# Patient Record
Sex: Female | Born: 1965 | Race: Black or African American | Hispanic: No | Marital: Married | Smoking: Never smoker
Health system: Southern US, Community
[De-identification: ages and names within clinical notes are randomized; demographics above are authoritative.]

## PROBLEM LIST (undated history)

## (undated) DIAGNOSIS — A630 Anogenital (venereal) warts: Secondary | ICD-10-CM

## (undated) DIAGNOSIS — S83419A Sprain of medial collateral ligament of unspecified knee, initial encounter: Secondary | ICD-10-CM

## (undated) DIAGNOSIS — Z8619 Personal history of other infectious and parasitic diseases: Secondary | ICD-10-CM

## (undated) DIAGNOSIS — K449 Diaphragmatic hernia without obstruction or gangrene: Secondary | ICD-10-CM

## (undated) DIAGNOSIS — R200 Anesthesia of skin: Secondary | ICD-10-CM

## (undated) DIAGNOSIS — K219 Gastro-esophageal reflux disease without esophagitis: Secondary | ICD-10-CM

## (undated) HISTORY — DX: Diaphragmatic hernia without obstruction or gangrene: K44.9

## (undated) HISTORY — DX: Sprain of medial collateral ligament of unspecified knee, initial encounter: S83.419A

## (undated) HISTORY — DX: Anogenital (venereal) warts: A63.0

## (undated) HISTORY — DX: Personal history of other infectious and parasitic diseases: Z86.19

## (undated) HISTORY — DX: Gastro-esophageal reflux disease without esophagitis: K21.9

## (undated) HISTORY — DX: Anesthesia of skin: R20.0

---

## 1999-06-07 ENCOUNTER — Other Ambulatory Visit: Admission: RE | Admit: 1999-06-07 | Discharge: 1999-06-07 | Payer: Self-pay | Admitting: Obstetrics and Gynecology

## 2002-06-07 ENCOUNTER — Other Ambulatory Visit: Admission: RE | Admit: 2002-06-07 | Discharge: 2002-06-07 | Payer: Self-pay | Admitting: *Deleted

## 2003-02-22 ENCOUNTER — Encounter: Payer: Self-pay | Admitting: Family Medicine

## 2003-02-22 ENCOUNTER — Encounter: Admission: RE | Admit: 2003-02-22 | Discharge: 2003-02-22 | Payer: Self-pay | Admitting: Family Medicine

## 2003-07-06 ENCOUNTER — Other Ambulatory Visit: Admission: RE | Admit: 2003-07-06 | Discharge: 2003-07-06 | Payer: Self-pay | Admitting: *Deleted

## 2003-08-07 ENCOUNTER — Emergency Department (HOSPITAL_COMMUNITY): Admission: EM | Admit: 2003-08-07 | Discharge: 2003-08-07 | Payer: Self-pay | Admitting: *Deleted

## 2004-07-04 ENCOUNTER — Other Ambulatory Visit: Admission: RE | Admit: 2004-07-04 | Discharge: 2004-07-04 | Payer: Self-pay | Admitting: Family Medicine

## 2004-09-26 ENCOUNTER — Encounter: Payer: Self-pay | Admitting: Gastroenterology

## 2004-10-25 ENCOUNTER — Ambulatory Visit: Payer: Self-pay | Admitting: Gastroenterology

## 2004-11-14 ENCOUNTER — Ambulatory Visit: Payer: Self-pay | Admitting: Family Medicine

## 2004-11-21 ENCOUNTER — Encounter: Admission: RE | Admit: 2004-11-21 | Discharge: 2004-11-21 | Payer: Self-pay | Admitting: Family Medicine

## 2004-11-30 ENCOUNTER — Ambulatory Visit: Payer: Self-pay | Admitting: Family Medicine

## 2005-01-23 DIAGNOSIS — R200 Anesthesia of skin: Secondary | ICD-10-CM

## 2005-01-23 HISTORY — DX: Anesthesia of skin: R20.0

## 2005-06-17 ENCOUNTER — Ambulatory Visit: Payer: Self-pay | Admitting: Family Medicine

## 2005-08-27 ENCOUNTER — Other Ambulatory Visit: Admission: RE | Admit: 2005-08-27 | Discharge: 2005-08-27 | Payer: Self-pay | Admitting: Family Medicine

## 2005-08-27 ENCOUNTER — Ambulatory Visit: Payer: Self-pay | Admitting: Family Medicine

## 2005-12-23 DIAGNOSIS — S83419A Sprain of medial collateral ligament of unspecified knee, initial encounter: Secondary | ICD-10-CM

## 2005-12-23 HISTORY — DX: Sprain of medial collateral ligament of unspecified knee, initial encounter: S83.419A

## 2006-01-02 ENCOUNTER — Ambulatory Visit: Payer: Self-pay | Admitting: Family Medicine

## 2006-03-27 ENCOUNTER — Encounter: Admission: RE | Admit: 2006-03-27 | Discharge: 2006-03-27 | Payer: Self-pay | Admitting: Family Medicine

## 2006-05-04 ENCOUNTER — Emergency Department (HOSPITAL_COMMUNITY): Admission: EM | Admit: 2006-05-04 | Discharge: 2006-05-04 | Payer: Self-pay | Admitting: Emergency Medicine

## 2006-07-21 ENCOUNTER — Ambulatory Visit: Payer: Self-pay | Admitting: Family Medicine

## 2006-09-10 ENCOUNTER — Ambulatory Visit: Payer: Self-pay | Admitting: Family Medicine

## 2006-10-01 ENCOUNTER — Other Ambulatory Visit: Admission: RE | Admit: 2006-10-01 | Discharge: 2006-10-01 | Payer: Self-pay | Admitting: Family Medicine

## 2006-10-01 ENCOUNTER — Encounter: Payer: Self-pay | Admitting: Family Medicine

## 2006-10-01 ENCOUNTER — Ambulatory Visit: Payer: Self-pay | Admitting: Family Medicine

## 2006-10-01 LAB — CONVERTED CEMR LAB: Pap Smear: NORMAL

## 2006-11-24 ENCOUNTER — Ambulatory Visit: Payer: Self-pay | Admitting: Family Medicine

## 2007-01-22 ENCOUNTER — Ambulatory Visit: Payer: Self-pay | Admitting: Family Medicine

## 2007-03-20 ENCOUNTER — Encounter: Payer: Self-pay | Admitting: Family Medicine

## 2007-03-20 DIAGNOSIS — N6019 Diffuse cystic mastopathy of unspecified breast: Secondary | ICD-10-CM | POA: Insufficient documentation

## 2007-03-20 DIAGNOSIS — J309 Allergic rhinitis, unspecified: Secondary | ICD-10-CM | POA: Insufficient documentation

## 2007-03-20 DIAGNOSIS — B977 Papillomavirus as the cause of diseases classified elsewhere: Secondary | ICD-10-CM | POA: Insufficient documentation

## 2007-03-20 DIAGNOSIS — K219 Gastro-esophageal reflux disease without esophagitis: Secondary | ICD-10-CM | POA: Insufficient documentation

## 2007-03-20 DIAGNOSIS — L509 Urticaria, unspecified: Secondary | ICD-10-CM | POA: Insufficient documentation

## 2007-04-21 ENCOUNTER — Telehealth (INDEPENDENT_AMBULATORY_CARE_PROVIDER_SITE_OTHER): Payer: Self-pay | Admitting: *Deleted

## 2007-06-24 ENCOUNTER — Ambulatory Visit: Payer: Self-pay | Admitting: Family Medicine

## 2007-07-24 ENCOUNTER — Other Ambulatory Visit: Admission: RE | Admit: 2007-07-24 | Discharge: 2007-07-24 | Payer: Self-pay | Admitting: Family Medicine

## 2007-07-24 ENCOUNTER — Encounter: Payer: Self-pay | Admitting: Family Medicine

## 2007-07-24 ENCOUNTER — Ambulatory Visit: Payer: Self-pay | Admitting: Family Medicine

## 2007-07-28 ENCOUNTER — Encounter: Admission: RE | Admit: 2007-07-28 | Discharge: 2007-07-28 | Payer: Self-pay | Admitting: Family Medicine

## 2007-07-30 ENCOUNTER — Encounter (INDEPENDENT_AMBULATORY_CARE_PROVIDER_SITE_OTHER): Payer: Self-pay | Admitting: *Deleted

## 2007-07-30 LAB — CONVERTED CEMR LAB: Pap Smear: NORMAL

## 2007-09-01 ENCOUNTER — Telehealth: Payer: Self-pay | Admitting: Family Medicine

## 2007-09-02 ENCOUNTER — Ambulatory Visit: Payer: Self-pay | Admitting: Obstetrics & Gynecology

## 2007-10-12 ENCOUNTER — Telehealth: Payer: Self-pay | Admitting: Family Medicine

## 2007-11-02 ENCOUNTER — Encounter: Admission: RE | Admit: 2007-11-02 | Discharge: 2007-11-02 | Payer: Self-pay | Admitting: Family Medicine

## 2007-11-04 ENCOUNTER — Encounter (INDEPENDENT_AMBULATORY_CARE_PROVIDER_SITE_OTHER): Payer: Self-pay | Admitting: *Deleted

## 2007-12-03 ENCOUNTER — Telehealth: Payer: Self-pay | Admitting: Family Medicine

## 2008-01-05 ENCOUNTER — Telehealth: Payer: Self-pay | Admitting: Family Medicine

## 2008-01-29 ENCOUNTER — Ambulatory Visit: Payer: Self-pay | Admitting: Family Medicine

## 2008-01-29 LAB — CONVERTED CEMR LAB
Bacteria, UA: 0
Bilirubin Urine: NEGATIVE
Casts: 0 /lpf
Glucose, Urine, Semiquant: NEGATIVE
Ketones, urine, test strip: NEGATIVE
Nitrite: NEGATIVE
Specific Gravity, Urine: 1.015
Urine crystals, microscopic: 0 /hpf
Urobilinogen, UA: 0.2
WBC, UA: 0 cells/hpf
Yeast, UA: 0
pH: 6.5

## 2008-03-18 ENCOUNTER — Ambulatory Visit: Payer: Self-pay | Admitting: Family Medicine

## 2008-03-22 ENCOUNTER — Encounter: Admission: RE | Admit: 2008-03-22 | Discharge: 2008-03-22 | Payer: Self-pay | Admitting: Family Medicine

## 2008-07-08 ENCOUNTER — Ambulatory Visit: Payer: Self-pay | Admitting: Family Medicine

## 2008-07-08 LAB — CONVERTED CEMR LAB
KOH Prep: NEGATIVE
Whiff Test: NEGATIVE

## 2008-10-13 ENCOUNTER — Ambulatory Visit: Payer: Self-pay | Admitting: Family Medicine

## 2008-10-13 ENCOUNTER — Encounter: Payer: Self-pay | Admitting: Family Medicine

## 2008-10-13 ENCOUNTER — Other Ambulatory Visit: Admission: RE | Admit: 2008-10-13 | Discharge: 2008-10-13 | Payer: Self-pay | Admitting: Family Medicine

## 2008-10-17 LAB — CONVERTED CEMR LAB
ALT: 18 units/L (ref 0–35)
AST: 20 units/L (ref 0–37)
Albumin: 3.8 g/dL (ref 3.5–5.2)
Alkaline Phosphatase: 54 units/L (ref 39–117)
BUN: 10 mg/dL (ref 6–23)
Basophils Absolute: 0.1 10*3/uL (ref 0.0–0.1)
Basophils Relative: 1.4 % (ref 0.0–3.0)
Bilirubin, Direct: 0.1 mg/dL (ref 0.0–0.3)
CO2: 29 meq/L (ref 19–32)
Calcium: 8.9 mg/dL (ref 8.4–10.5)
Chloride: 105 meq/L (ref 96–112)
Cholesterol: 218 mg/dL (ref 0–200)
Creatinine, Ser: 0.8 mg/dL (ref 0.4–1.2)
Direct LDL: 123.8 mg/dL
Eosinophils Absolute: 0.2 10*3/uL (ref 0.0–0.7)
Eosinophils Relative: 3.4 % (ref 0.0–5.0)
GFR calc Af Amer: 102 mL/min
GFR calc non Af Amer: 84 mL/min
Glucose, Bld: 87 mg/dL (ref 70–99)
HCT: 39.6 % (ref 36.0–46.0)
HDL: 60.8 mg/dL (ref >39.0)
Hemoglobin: 13.6 g/dL (ref 12.0–15.0)
Lymphocytes Relative: 28.8 % (ref 12.0–46.0)
MCHC: 34.3 g/dL (ref 30.0–36.0)
MCV: 87 fL (ref 78.0–100.0)
Monocytes Absolute: 0.4 10*3/uL (ref 0.1–1.0)
Monocytes Relative: 6.1 % (ref 3.0–12.0)
Neutro Abs: 3.4 10*3/uL (ref 1.4–7.7)
Neutrophils Relative %: 60.3 % (ref 43.0–77.0)
Platelets: 407 10*3/uL — ABNORMAL HIGH (ref 150–400)
Potassium: 3.5 meq/L (ref 3.5–5.1)
RBC: 4.55 M/uL (ref 3.87–5.11)
RDW: 12.6 % (ref 11.5–14.6)
Sodium: 140 meq/L (ref 135–145)
TSH: 1 microintl units/mL (ref 0.35–5.50)
Total Bilirubin: 0.9 mg/dL (ref 0.3–1.2)
Total CHOL/HDL Ratio: 3.6
Total Protein: 7.4 g/dL (ref 6.0–8.3)
Triglycerides: 61 mg/dL (ref 0–149)
VLDL: 12 mg/dL (ref 0–40)
WBC: 5.8 10*3/uL (ref 4.5–10.5)

## 2008-11-02 ENCOUNTER — Encounter: Admission: RE | Admit: 2008-11-02 | Discharge: 2008-11-02 | Payer: Self-pay | Admitting: Family Medicine

## 2008-11-08 ENCOUNTER — Ambulatory Visit: Payer: Self-pay | Admitting: Gastroenterology

## 2008-11-22 ENCOUNTER — Encounter: Payer: Self-pay | Admitting: Family Medicine

## 2008-12-14 ENCOUNTER — Ambulatory Visit: Payer: Self-pay | Admitting: Family Medicine

## 2009-02-10 ENCOUNTER — Telehealth (INDEPENDENT_AMBULATORY_CARE_PROVIDER_SITE_OTHER): Payer: Self-pay | Admitting: Internal Medicine

## 2009-09-06 ENCOUNTER — Ambulatory Visit: Payer: Self-pay | Admitting: Family Medicine

## 2009-12-11 ENCOUNTER — Encounter: Admission: RE | Admit: 2009-12-11 | Discharge: 2009-12-11 | Payer: Self-pay | Admitting: Family Medicine

## 2009-12-14 ENCOUNTER — Encounter (INDEPENDENT_AMBULATORY_CARE_PROVIDER_SITE_OTHER): Payer: Self-pay | Admitting: *Deleted

## 2010-01-15 ENCOUNTER — Ambulatory Visit: Payer: Self-pay | Admitting: Family Medicine

## 2010-01-15 ENCOUNTER — Other Ambulatory Visit: Admission: RE | Admit: 2010-01-15 | Discharge: 2010-01-15 | Payer: Self-pay | Admitting: Family Medicine

## 2010-01-18 LAB — CONVERTED CEMR LAB
ALT: 12 units/L (ref 0–35)
AST: 18 units/L (ref 0–37)
Albumin: 4 g/dL (ref 3.5–5.2)
Alkaline Phosphatase: 45 units/L (ref 39–117)
BUN: 6 mg/dL (ref 6–23)
Basophils Absolute: 0 10*3/uL (ref 0.0–0.1)
Basophils Relative: 0.9 % (ref 0.0–3.0)
Bilirubin, Direct: 0.1 mg/dL (ref 0.0–0.3)
CO2: 25 meq/L (ref 19–32)
Calcium: 9.1 mg/dL (ref 8.4–10.5)
Chloride: 107 meq/L (ref 96–112)
Cholesterol: 168 mg/dL (ref 0–200)
Creatinine, Ser: 0.6 mg/dL (ref 0.4–1.2)
Eosinophils Absolute: 0.2 10*3/uL (ref 0.0–0.7)
Eosinophils Relative: 3.5 % (ref 0.0–5.0)
GFR calc non Af Amer: 140.74 mL/min (ref >60)
Glucose, Bld: 62 mg/dL — ABNORMAL LOW (ref 70–99)
HCT: 40.1 % (ref 36.0–46.0)
HDL: 57.4 mg/dL (ref >39.00)
Hemoglobin: 13.4 g/dL (ref 12.0–15.0)
LDL Cholesterol: 101 mg/dL — ABNORMAL HIGH (ref 0–99)
Lymphocytes Relative: 36.2 % (ref 12.0–46.0)
Lymphs Abs: 2 10*3/uL (ref 0.7–4.0)
MCHC: 33.5 g/dL (ref 30.0–36.0)
MCV: 88.3 fL (ref 78.0–100.0)
Monocytes Absolute: 0.3 10*3/uL (ref 0.1–1.0)
Monocytes Relative: 6.3 % (ref 3.0–12.0)
Neutro Abs: 3 10*3/uL (ref 1.4–7.7)
Neutrophils Relative %: 53.1 % (ref 43.0–77.0)
Platelets: 356 10*3/uL (ref 150.0–400.0)
Potassium: 3.9 meq/L (ref 3.5–5.1)
RBC: 4.54 M/uL (ref 3.87–5.11)
RDW: 12.4 % (ref 11.5–14.6)
Sodium: 137 meq/L (ref 135–145)
TSH: 1.27 microintl units/mL (ref 0.35–5.50)
Total Bilirubin: 0.8 mg/dL (ref 0.3–1.2)
Total CHOL/HDL Ratio: 3
Total Protein: 7.6 g/dL (ref 6.0–8.3)
Triglycerides: 48 mg/dL (ref 0.0–149.0)
VLDL: 9.6 mg/dL (ref 0.0–40.0)
WBC: 5.5 10*3/uL (ref 4.5–10.5)

## 2010-01-23 ENCOUNTER — Encounter (INDEPENDENT_AMBULATORY_CARE_PROVIDER_SITE_OTHER): Payer: Self-pay | Admitting: *Deleted

## 2010-01-23 LAB — CONVERTED CEMR LAB: Pap Smear: NEGATIVE

## 2010-05-28 ENCOUNTER — Telehealth: Payer: Self-pay | Admitting: Family Medicine

## 2010-09-28 ENCOUNTER — Ambulatory Visit: Payer: Self-pay | Admitting: Family Medicine

## 2010-10-03 ENCOUNTER — Encounter: Admission: RE | Admit: 2010-10-03 | Discharge: 2010-10-03 | Payer: Self-pay | Admitting: Family Medicine

## 2010-10-04 LAB — CONVERTED CEMR LAB
Prolactin: 8.4 ng/mL
TSH: 0.97 u[IU]/mL (ref 0.35–5.50)

## 2010-10-09 ENCOUNTER — Encounter (INDEPENDENT_AMBULATORY_CARE_PROVIDER_SITE_OTHER): Payer: Self-pay | Admitting: *Deleted

## 2010-10-09 ENCOUNTER — Encounter: Payer: Self-pay | Admitting: Family Medicine

## 2010-11-06 ENCOUNTER — Ambulatory Visit: Payer: Self-pay | Admitting: Family Medicine

## 2011-01-12 ENCOUNTER — Encounter: Payer: Self-pay | Admitting: Family Medicine

## 2011-01-14 ENCOUNTER — Encounter: Payer: Self-pay | Admitting: Family Medicine

## 2011-01-24 NOTE — Miscellaneous (Signed)
Summary: mammogram added to flow sheet   Clinical Lists Changes  Observations: Added new observation of MAMMOGRAM: normal (10/03/2010 11:41)      Preventive Care Screening  Mammogram:    Date:  10/03/2010    Results:  normal Left message for patient to call back. Lewanda Rife LPN  October 09, 2010 11:41 AM

## 2011-01-24 NOTE — Letter (Signed)
Summary: Generic Letter  [place] [facility]  [address] [place], [place] [address]   Phone: [phone]  Fax: [phone]    01/23/2010    Anna [name] [address] [place], [place]  [address]     Dear [name],   Your pap smear was normal, please repeat screening in one year    Sincerely,   Liane Comber CMA (AAMA)

## 2011-01-24 NOTE — Letter (Signed)
Summary: Results Follow up Letter   at [facility]  [address] [place], [place] [address]   Phone: [phone]  Fax: [phone]    10/09/2010 MRN: [id number]   Anna [name] [address] [place], [place]  [address]    Dear [name],  The following are the results of your recent test(s):  Test         Result    Pap Smear:        Normal _____  Not Normal _____ Comments: ______________________________________________________ Cholesterol: LDL(Bad cholesterol):         Your goal is less than:         HDL (Good cholesterol):       Your goal is more than: Comments:  ______________________________________________________ Mammogram:        Normal __x___  Not Normal _____ Comments: No findings to explain Nipple discharge.        Repeat Mammogram in one year.   ___________________________________________________________________ Hemoccult:        Normal _____  Not normal _______ Comments:    _____________________________________________________________________ Other Tests:    We routinely do not discuss normal results over the telephone.  If you desire a copy of the results, or you have any questions about this information we can discuss them at your next office visit.   Sincerely,  Idamae Schuller Tower,MD

## 2011-01-24 NOTE — Assessment & Plan Note (Signed)
Summary: cpx/pap/mk   Vital Signs:  Patient profile:   45 year old female Height:      63.5 inches Weight:      126 pounds BMI:     22.05 Temp:     97.6 degrees F oral Pulse rate:   68 / minute Pulse rhythm:   regular BP sitting:   90 / 58  (left arm) Cuff size:   regular  Vitals Entered By: Lowella Petties CMA (January 15, 2010 2:48 PM) CC: 30 minute check up   History of Present Illness: here for health mt exam  is feeling fine overall -- is healthy  has a few concerns   has pains intermittently in buttocks -- hurts to straighten up and stand up after sitting for a long time  has been like this for 4 months -- 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   is exercising treadmill - but no wt lifting or stretching   she bit her lip - top of it -- then ulcers/ blisters there that hurt / has not had cold sore for a long time    wt is down 3 lb with bmi of 22 is on a diet eating fruit and vegetable s and peanuts and rice , and lots of water for 3 weeks -- is on a fast (religous and health)    pap nl 10/09  no trouble withmenses -- pretty regular and not  too heavy or painful  no need to be on birth control    mam 12/10- normal recently self exam - no lumps or changes   Td 06 up to date  does nto get flu shots   last labs good 2009 with LDL 123  wants to do labs today   Allergies: No Known Drug Allergies  Past History:  Past Medical History: Last updated: 07/08/2008 Allergic rhinitis GERD/ Hiatal hernia small uterine fibroids ? anxiety  Past Surgical History: Last updated: 03/20/2007 Abd [place] - negative (07/2004) EGD- gastritis, esophagitis (09/2004) MRI/ MRA- sinusitis, white matter changes (10/2004) Facial numbness- neuro. work-up (01/2005) MCL sprain (2007)  Family History: Last updated: 11/08/2008 father, cerebral aneurysm M aunt breast ca aunt with ? female ca No FH of Colon Cancer:  Social History: Last updated: 10/13/2008 non  smoker rare alcohol exercises regularly  Risk Factors: Smoking Status: never (03/20/2007)  Review of Systems General:  Denies fatigue, fever, loss of appetite, and malaise. Eyes:  Denies blurring and eye irritation. CV:  Denies chest pain or discomfort, lightheadness, palpitations, and shortness of breath with exertion. Resp:  Denies cough and wheezing. GI:  Denies abdominal pain, change in bowel habits, indigestion, and loss of appetite. GU:  Denies abnormal vaginal bleeding, discharge, dysuria, and urinary frequency. MS:  Complains of low back pain; denies joint pain, mid back pain, and cramps. Derm:  Denies itching, lesion(s), and rash. Neuro:  Denies numbness and tingling. Psych:  Denies anxiety and depression. Endo:  Denies cold intolerance and excessive hunger. Heme:  Denies abnormal bruising and bleeding.  Physical Exam  General:  Well-developed,well-nourished,in no acute distress; alert,appropriate and cooperative throughout examination Head:  normocephalic, atraumatic, and no abnormalities observed.   Eyes:  vision grossly intact, pupils equal, pupils round, and pupils reactive to light.  no conjunctival pallor, injection or icterus  Ears:  R ear normal and L ear normal.   Nose:  no nasal discharge.   Mouth:  2 small apthous ulcers under L side of upper lip Neck:  supple  with full rom and no masses or thyromegally, no JVD or carotid bruit  Chest Wall:  No deformities, masses, or tenderness noted. Breasts:  No mass, nodules, thickening, tenderness, bulging, retraction, inflamation, nipple discharge or skin changes noted.   Lungs:  Normal respiratory effort, chest expands symmetrically. Lungs are clear to auscultation, no crackles or wheezes. Heart:  Normal rate and regular rhythm. S1 and S2 normal without gallop, murmur, click, rub or other extra sounds. Abdomen:  Bowel sounds positive,abdomen soft and non-tender without masses, organomegaly or hernias noted. no renal bruits   Genitalia:  Normal introitus for age, no external lesions, no vaginal discharge, mucosa pink and moist, no vaginal or cervical lesions, no vaginal atrophy, no friaility or hemorrhage, normal uterus size and position, no adnexal masses or tenderness Msk:  spine- loss of lordosis mild coccyx tenderness nl rom and neg slr Pulses:  R and L carotid,radial,femoral,dorsalis pedis and posterior tibial pulses are full and equal bilaterally Extremities:  No clubbing, cyanosis, edema, or deformity noted with normal full range of motion of all joints.   Neurologic:  sensation intact to light touch, gait normal, and DTRs symmetrical and normal.   Skin:  Intact without suspicious lesions or rashes Cervical Nodes:  No lymphadenopathy noted Axillary Nodes:  No palpable lymphadenopathy Inguinal Nodes:  No significant adenopathy Psych:  normal affect, talkative and pleasant    Impression & Recommendations:  Problem # 1:  HEALTH MAINTENANCE EXAM (ICD-V70.0) Assessment Comment Only reviewed health habits including diet, exercise and skin cancer prevention reviewed health maintenance list and family history  lab today  urged to get more protien in diet  urged to keep up the good exercise  Orders: Venipuncture (85462) TLB-Lipid Panel (80061-LIPID) TLB-BMP (Basic Metabolic Panel-BMET) (80048-METABOL) TLB-CBC Platelet - w/Differential (85025-CBCD) TLB-Hepatic/Liver Function Pnl (80076-HEPATIC) TLB-TSH (Thyroid Stimulating Hormone) (84443-TSH)  Problem # 2:  GYNECOLOGICAL EXAMINATION, ROUTINE (ICD-V72.31) Assessment: Comment Only annual exam with pap done   Problem # 3:  HYPERLIPIDEMIA (ICD-272.4) Assessment: Unchanged  check labs- expect good results with recent change in diet  rev sat fats to avoid  Orders: TLB-Lipid Panel (80061-LIPID)  Labs Reviewed: SGOT: 20 (10/13/2008)   SGPT: 18 (10/13/2008)   HDL:60.8 (10/13/2008)  LDL:DEL (10/13/2008)  Chol:218 (10/13/2008)  Trig:61  (10/13/2008)  Problem # 4:  BACK PAIN (ICD-724.5) Assessment: New low back pain- may be related to prolonged sitting  recommend stretches/heat / better sitting positon / frequent breaks may be interested in trying yoga as well handout aafp on back pain adv to update me if worse or not imp  Problem # 5:  ORAL ULCER (ICD-528.9) Assessment: New under L upper lip -- likely due to trauma  will tx with kenalog in oragel base- and update if not imp   Complete Medication List: 1)  Flonase 50 Mcg/act Susp (Fluticasone propionate) .... 2 sprays in each nostril once daily through allergy season 2)  Kenalog in Orabase Gel  .... Apply to mouth ulcer with a q tip three times a day until healed  Patient Instructions: 1)  switch your nuts to walnuts and almonds - they have healthier type of fats  2)  protien sources include dairy, nuts , beans , soy, lean meats , fish 3)  make sure to get protien with every meal  4)  keep up the good exercise- add some stretches or yoga for your back  5)  let me know if back pain does not improve - use heat when you can  6)  labs  today   7)  use kenalog in orabase- on mouth ulceruntil healed  Prescriptions: KENALOG IN ORABASE GEL apply to mouth ulcer with a Q tip three times a day until healed  #1 small x 0   Entered and Authorized by:   Judith Part MD   Signed by:   Judith Part MD on 01/15/2010   Method used:   Print then Give to Patient   RxID:   [id number]   Prior Medications (reviewed today): FLONASE 50 MCG/ACT SUSP (FLUTICASONE PROPIONATE) 2 sprays in each nostril once daily through allergy season KENALOG IN ORABASE GEL () apply to mouth ulcer with a Q tip three times a day until healed Current Allergies: No known allergies

## 2011-01-24 NOTE — Progress Notes (Signed)
Summary: call a nurse  Phone Note From Other Clinic   Caller: call a nurse Summary of Call: [facility] Triage Call Report Triage Record Num: [id number] Operator: Albertine Grates Patient Name: Tallulah Hosman Call Date & Time: 05/25/2010 5:38:30PM Patient Phone: [phone] PCP: Audrie Gallus. Kaleia Longhi Patient Gender: Female PCP Fax : Patient DOB: [birth year] Practice Name: [place] Reason for Call: Has nausea/vomiting since 6-3. Has vomitted x 5. Has had 3 diarrhea stools. Has voided. Afebrile. Has been able to keep down small amount of fluids. Phenergan 25mg  op q4hrs prn #6 called to [phone]. Protocol(s) Used: Nausea / Vomiting Recommended Outcome per Protocol: Provide Home/Self Care Reason for Outcome: New onset of 3-4 episodes vomiting or diarrhea following mild abdominal cramping Care Advice:  ~ Call provider if symptoms do not improve after 24 hours of home care. 05/25/2010 5:55:03PM Page 1 of 1 [email or web address] Initial call taken by: Lowella Petties CMA,  May 28, 2010 10:11 AM

## 2011-01-24 NOTE — Assessment & Plan Note (Signed)
Summary: having discharge in nipples/alc   Vital Signs:  Patient profile:   45 year old female Height:      63.5 inches Weight:      132.25 pounds BMI:     23.14 Temp:     98 degrees F oral Pulse rate:   72 / minute Pulse rhythm:   regular BP sitting:   110 / 70  (left arm) Cuff size:   regular  Vitals Entered By: Lewanda Rife LPN (September 28, 2010 10:29 AM) CC: dried milk appears around nipple on and off   History of Present Illness: here for nipple discharge has been having L breast white discharge - looks like dried milk, then on both breast  not able to express any material about 2 months no pain or lumps  feels a funny feeling in nipples like milk letdown  sometimes is very itchy as well  nothing today   no lumps   no chance pregnant  not sexually active at all  last period was 3 weeks ago  occ a little breast soreness before menses - esp the L side   also wondering about hormone change    tends to have allergic reactions on skin - like hives -- using benadryl a lot  carries epi pen  rash comes and goes on her hand L that comes and goes  putting hydrocort cream on it - does not help a lot -- but got better on its own     wt is up 5 lb  last mam 12/10 normal   has M aunt with breast cancer  Allergies (verified): No Known Drug Allergies  Past History:  Past Medical History: Last updated: 07/08/2008 Allergic rhinitis GERD/ Hiatal hernia small uterine fibroids ? anxiety  Past Surgical History: Last updated: 03/20/2007 Abd [place] - negative (07/2004) EGD- gastritis, esophagitis (09/2004) MRI/ MRA- sinusitis, white matter changes (10/2004) Facial numbness- neuro. work-up (01/2005) MCL sprain (2007)  Family History: Last updated: 11/08/2008 father, cerebral aneurysm M aunt breast ca aunt with ? female ca No FH of Colon Cancer:  Social History: Last updated: 10/13/2008 non smoker rare alcohol exercises regularly  Risk Factors: Smoking  Status: never (03/20/2007)  Review of Systems General:  Denies fatigue, loss of appetite, and malaise. Eyes:  Denies blurring and eye irritation. CV:  Denies chest pain or discomfort, palpitations, and shortness of breath with exertion. Resp:  Denies cough, shortness of breath, and wheezing. GI:  Denies abdominal pain, change in bowel habits, and indigestion. GU:  Denies dysuria and urinary frequency. MS:  Denies joint pain, joint redness, and joint swelling. Derm:  Complains of itching and rash; denies insect bite(s). Neuro:  Denies headaches, numbness, and tingling. Psych:  Denies anxiety and depression. Endo:  Denies cold intolerance and heat intolerance. Heme:  Denies abnormal bruising, bleeding, and enlarge lymph nodes.  Physical Exam  General:  Well-developed,well-nourished,in no acute distress; alert,appropriate and cooperative throughout examination Head:  normocephalic, atraumatic, and no abnormalities observed.   Eyes:  vision grossly intact, pupils equal, pupils round, and pupils reactive to light.   Mouth:  pharynx pink and moist.   Neck:  No deformities, masses, or tenderness noted. Breasts:  No mass, nodules, thickening, tenderness, bulging, retraction, inflamation, nipple discharge or skin changes noted.  -- breast tissue is generally ropey with fibrocystic change no dried material or skin change unable to express any fluid from nipples Lungs:  Normal respiratory effort, chest expands symmetrically. Lungs are clear to auscultation, no crackles  or wheezes. Heart:  Normal rate and regular rhythm. S1 and S2 normal without gallop, murmur, click, rub or other extra sounds. Abdomen:  Bowel sounds positive,abdomen soft and non-tender without masses, organomegaly or hernias noted. Msk:  No deformity or scoliosis noted of thoracic or lumbar spine.   Extremities:  No clubbing, cyanosis, edema, or deformity noted with normal full range of motion of all joints.   Skin:  Intact  without suspicious lesions or rashes no hives or rash today Cervical Nodes:  No lymphadenopathy noted Axillary Nodes:  No palpable lymphadenopathy Inguinal Nodes:  No significant adenopathy Psych:  normal affect, talkative and pleasant    Impression & Recommendations:  Problem # 1:  NIPPLE DISCHARGE (ICD-611.79) Assessment New this is new/ bilateral and mild in pt with hx of fibrocystic breasts  will check tsh and PL today  early dx mamm- then make plan Orders: Venipuncture (04540) TLB-TSH (Thyroid Stimulating Hormone) (98119-JYN) T-Prolactin (82956-21308) Specimen Handling (65784) Radiology Referral (Radiology)  Problem # 2:  Hx of URTICARIA (ICD-708.9) Assessment: Unchanged recommend as needed antihistamines and keep log of exposures/ food and otherwise   Complete Medication List: 1)  Flonase 50 Mcg/act Susp (Fluticasone propionate) .... 2 sprays in each nostril once daily through allergy season 2)  Kenalog in Orabase Gel  .... Apply to mouth ulcer with a q tip three times a day until healed as needed 3)  Cleocin-t 1 % Gel (Clindamycin phosphate) .... Apply to face in am 4)  Roc Retinol Correxion Crea (Emollient) .... Apply to face at bedtime 5)  Doxycycline Hyclate 100 Mg Tabs (Doxycycline hyclate) .... One tablet by mouth twice a day with food  Patient Instructions: 1)  labs today for nipple discharge  2)  will send you for mammogram  3)  then based on results will make a plan   Current Allergies (reviewed today): No known allergies

## 2011-01-24 NOTE — Assessment & Plan Note (Signed)
Summary: COLD/COUGH/DLO   Vital Signs:  Patient profile:   45 year old female Height:      63.5 inches Weight:      131.75 pounds BMI:     23.06 Temp:     97.9 degrees F oral Pulse rate:   76 / minute Pulse rhythm:   regular BP sitting:   108 / 72  (left arm) Cuff size:   regular  Vitals Entered By: Lewanda Rife LPN (November 06, 2010 9:08 AM) CC: productive cough with yellow green phlegm, head congested, ? sinus and eyes feel tired and burn   History of Present Illness: started gettting a week ago sunday   started in her throat and progressed to her chest  productive cough -- yellow and green mucous  then head congestion  sinus pain under l eye is worse and worse and teeth hurt on that side too  tried benadryl sinus and nyquil / [facility] cold and cough and theraflu  nothing helping much  nofever   no n/v   daughter gave this to her     Allergies (verified): No Known Drug Allergies  Past History:  Past Medical History: Last updated: 07/08/2008 Allergic rhinitis GERD/ Hiatal hernia small uterine fibroids ? anxiety  Past Surgical History: Last updated: 03/20/2007 Abd [place] - negative (07/2004) EGD- gastritis, esophagitis (09/2004) MRI/ MRA- sinusitis, white matter changes (10/2004) Facial numbness- neuro. work-up (01/2005) MCL sprain (2007)  Family History: Last updated: 11/08/2008 father, cerebral aneurysm M aunt breast ca aunt with ? female ca No FH of Colon Cancer:  Social History: Last updated: 10/13/2008 non smoker rare alcohol exercises regularly  Risk Factors: Smoking Status: never (03/20/2007)  Review of Systems General:  Complains of chills and fatigue. Eyes:  Denies blurring and discharge. ENT:  Complains of nasal congestion, postnasal drainage, sinus pressure, and sore throat; denies earache. CV:  Denies chest pain or discomfort and palpitations. Resp:  Complains of cough; denies pleuritic and shortness of breath. GI:  Denies diarrhea,  nausea, and vomiting. Derm:  Denies lesion(s), poor wound healing, and rash. Neuro:  Complains of headaches.  Physical Exam  General:  fatigued appearing / slim  Head:  normocephalic, atraumatic, and no abnormalities observed.  L maxilary sinus tenderness Eyes:  vision grossly intact, pupils equal, pupils round, pupils reactive to light, and no injection.   Ears:  R ear normal and L ear normal.   Nose:  nares are injected and congested bilaterally  Mouth:  pharynx pink and moist, no erythema, and no exudates.   Neck:  No deformities, masses, or tenderness noted. Lungs:  harsh bs at bases no rhonchi/ rales or wheeze Heart:  Normal rate and regular rhythm. S1 and S2 normal without gallop, murmur, click, rub or other extra sounds. Skin:  Intact without suspicious lesions or rashes Cervical Nodes:  No lymphadenopathy noted Psych:  normal affect, talkative and pleasant    Impression & Recommendations:  Problem # 1:  SINUSITIS - ACUTE-NOS (ICD-461.9) Assessment New following viral uri with head and chest congestion tx with augmentin (L maxillary pain )  also guiatuss ac-- and update if not imp  Her updated medication list for this problem includes:    Flonase 50 Mcg/act Susp (Fluticasone propionate) .Marland Kitchen... 2 sprays in each nostril once daily through allergy season    Doxycycline Hyclate 100 Mg Tabs (Doxycycline hyclate) ..... One tablet by mouth twice a day with food    Augmentin 875-125 Mg Tabs (Amoxicillin-pot clavulanate) .Marland Kitchen... 1 by mouth two  times a day for 10 days for sinus infection    Guiatuss Ac 100-10 Mg/50ml Syrp (Guaifenesin-codeine) .Marland Kitchen... 1-2 teaspoons up to every 4-6 hours as needed cough  watch out for sedation  Complete Medication List: 1)  Flonase 50 Mcg/act Susp (Fluticasone propionate) .... 2 sprays in each nostril once daily through allergy season 2)  Kenalog in Orabase Gel  .... Apply to mouth ulcer with a q tip three times a day until healed as needed 3)  Cleocin-t 1  % Gel (Clindamycin phosphate) .... Apply to face in am 4)  Roc Retinol Correxion Crea (Emollient) .... Apply to face at bedtime 5)  Doxycycline Hyclate 100 Mg Tabs (Doxycycline hyclate) .... One tablet by mouth twice a day with food 6)  Augmentin 875-125 Mg Tabs (Amoxicillin-pot clavulanate) .Marland Kitchen.. 1 by mouth two times a day for 10 days for sinus infection 7)  Guiatuss Ac 100-10 Mg/29ml Syrp (Guaifenesin-codeine) .Marland Kitchen.. 1-2 teaspoons up to every 4-6 hours as needed cough  watch out for sedation  Patient Instructions: 1)  take the augmentin for sinus infection  2)  try the codine cough syrup for cough as needed -- caution of sedation  3)  get some rest and drink lots of fluids  Prescriptions: GUIATUSS AC 100-10 MG/5ML SYRP (GUAIFENESIN-CODEINE) 1-2 teaspoons up to every 4-6 hours as needed cough  watch out for sedation  #120cc x 0   Entered and Authorized by:   Judith Part MD   Signed by:   Judith Part MD on 11/06/2010   Method used:   Print then Give to Patient   RxID:   [id number] AUGMENTIN 875-125 MG TABS (AMOXICILLIN-POT CLAVULANATE) 1 by mouth two times a day for 10 days for sinus infection  #20 x 0   Entered and Authorized by:   Judith Part MD   Signed by:   Judith Part MD on 11/06/2010   Method used:   Print then Give to Patient   RxID:   [id number]    Orders Added: 1)  Est. Patient Level III [84696]    Current Allergies (reviewed today): No known allergies

## 2011-02-04 ENCOUNTER — Telehealth (INDEPENDENT_AMBULATORY_CARE_PROVIDER_SITE_OTHER): Payer: Self-pay | Admitting: *Deleted

## 2011-02-06 ENCOUNTER — Other Ambulatory Visit (INDEPENDENT_AMBULATORY_CARE_PROVIDER_SITE_OTHER): Payer: Self-pay

## 2011-02-06 ENCOUNTER — Encounter (INDEPENDENT_AMBULATORY_CARE_PROVIDER_SITE_OTHER): Payer: Self-pay | Admitting: *Deleted

## 2011-02-06 ENCOUNTER — Other Ambulatory Visit: Payer: Self-pay | Admitting: Family Medicine

## 2011-02-06 DIAGNOSIS — Z0000 Encounter for general adult medical examination without abnormal findings: Secondary | ICD-10-CM

## 2011-02-06 DIAGNOSIS — E785 Hyperlipidemia, unspecified: Secondary | ICD-10-CM

## 2011-02-06 LAB — BASIC METABOLIC PANEL
BUN: 15 mg/dL (ref 6–23)
CO2: 27 mEq/L (ref 19–32)
Calcium: 8.7 mg/dL (ref 8.4–10.5)
Chloride: 110 mEq/L (ref 96–112)
Creatinine, Ser: 0.5 mg/dL (ref 0.4–1.2)
GFR: 161.59 mL/min (ref >60.00)
Glucose, Bld: 82 mg/dL (ref 70–99)
Potassium: 4.4 mEq/L (ref 3.5–5.1)
Sodium: 141 mEq/L (ref 135–145)

## 2011-02-06 LAB — LIPID PANEL
Cholesterol: 167 mg/dL (ref 0–200)
HDL: 51.1 mg/dL (ref >39.00)
LDL Cholesterol: 106 mg/dL — ABNORMAL HIGH (ref 0–99)
Total CHOL/HDL Ratio: 3
Triglycerides: 49 mg/dL (ref 0.0–149.0)
VLDL: 9.8 mg/dL (ref 0.0–40.0)

## 2011-02-06 LAB — HEPATIC FUNCTION PANEL
ALT: 15 U/L (ref 0–35)
AST: 14 U/L (ref 0–37)
Albumin: 3.4 g/dL — ABNORMAL LOW (ref 3.5–5.2)
Alkaline Phosphatase: 45 U/L (ref 39–117)
Bilirubin, Direct: 0.1 mg/dL (ref 0.0–0.3)
Total Bilirubin: 0.5 mg/dL (ref 0.3–1.2)
Total Protein: 6.3 g/dL (ref 6.0–8.3)

## 2011-02-06 LAB — TSH: TSH: 1.08 u[IU]/mL (ref 0.35–5.50)

## 2011-02-07 LAB — CBC WITH DIFFERENTIAL/PLATELET
Basophils Absolute: 0 10*3/uL (ref 0.0–0.1)
Basophils Relative: 0.2 % (ref 0.0–3.0)
Eosinophils Absolute: 0.3 10*3/uL (ref 0.0–0.7)
Eosinophils Relative: 4.4 % (ref 0.0–5.0)
HCT: 36.7 % (ref 36.0–46.0)
Hemoglobin: 12.4 g/dL (ref 12.0–15.0)
Lymphocytes Relative: 24.4 % (ref 12.0–46.0)
Lymphs Abs: 1.7 10*3/uL (ref 0.7–4.0)
MCHC: 33.9 g/dL (ref 30.0–36.0)
MCV: 87.8 fl (ref 78.0–100.0)
Monocytes Absolute: 0.5 10*3/uL (ref 0.1–1.0)
Monocytes Relative: 6.9 % (ref 3.0–12.0)
Neutro Abs: 4.3 10*3/uL (ref 1.4–7.7)
Neutrophils Relative %: 64.1 % (ref 43.0–77.0)
Platelets: 314 10*3/uL (ref 150.0–400.0)
RBC: 4.18 Mil/uL (ref 3.87–5.11)
RDW: 13.5 % (ref 11.5–14.6)
WBC: 6.8 10*3/uL (ref 4.5–10.5)

## 2011-02-11 ENCOUNTER — Other Ambulatory Visit (HOSPITAL_COMMUNITY)
Admission: RE | Admit: 2011-02-11 | Discharge: 2011-02-11 | Disposition: A | Discharge status: Home / Self Care | Payer: Self-pay | Source: Ambulatory Visit | Attending: Family Medicine | Admitting: Family Medicine

## 2011-02-11 ENCOUNTER — Other Ambulatory Visit: Payer: Self-pay | Admitting: Family Medicine

## 2011-02-11 ENCOUNTER — Encounter: Payer: Self-pay | Admitting: Family Medicine

## 2011-02-11 ENCOUNTER — Encounter (INDEPENDENT_AMBULATORY_CARE_PROVIDER_SITE_OTHER): Payer: Self-pay | Admitting: Family Medicine

## 2011-02-11 DIAGNOSIS — Z124 Encounter for screening for malignant neoplasm of cervix: Secondary | ICD-10-CM | POA: Insufficient documentation

## 2011-02-11 DIAGNOSIS — N6019 Diffuse cystic mastopathy of unspecified breast: Secondary | ICD-10-CM

## 2011-02-11 DIAGNOSIS — J069 Acute upper respiratory infection, unspecified: Secondary | ICD-10-CM

## 2011-02-11 DIAGNOSIS — Z01419 Encounter for gynecological examination (general) (routine) without abnormal findings: Secondary | ICD-10-CM

## 2011-02-11 DIAGNOSIS — Z0000 Encounter for general adult medical examination without abnormal findings: Secondary | ICD-10-CM

## 2011-02-11 LAB — CYTOLOGY - PAP: Pap Smear: NORMAL

## 2011-02-13 NOTE — Progress Notes (Signed)
----   Converted from flag ---- ---- 02/02/2011 6:31 PM, Colon Flattery Tower MD wrote: please schedue lipid and wellness v70.0 and 272 thanks  ---- 01/29/2011 8:34 AM, Liane Comber CMA (AAMA) wrote: Lab orders please! Good Morning! This pt is scheduled for cpx labs Wed, which labs to draw and dx codes to use? Thanks Tasha ------------------------------

## 2011-02-13 NOTE — Progress Notes (Signed)
----   Converted from flag ---- ---- 02/02/2011 6:31 PM, Marne Ann Tower MD wrote: please schedue lipid and wellness v70.0 and 272 thanks  ---- 01/29/2011 8:34 AM, Natasha Chavers CMA (AAMA) wrote: Lab orders please! Good Morning! This pt is scheduled for cpx labs Wed, which labs to draw and dx codes to use? Thanks Tasha ------------------------------ 

## 2011-02-18 ENCOUNTER — Encounter: Payer: Self-pay | Admitting: Family Medicine

## 2011-02-19 NOTE — Assessment & Plan Note (Signed)
Summary: CPX/ALC   Vital Signs:  Patient profile:   45 year old female Weight:      131.50 pounds BMI:     23.01 Temp:     98.0 degrees F oral Pulse rate:   88 / minute Pulse rhythm:   regular BP sitting:   108 / 60  (left arm) Cuff size:   regular  Vitals Entered By: Sydell Axon LPN (February 11, 2011 2:45 PM) CC: 30 Minute checkup   History of Present Illness: here for health mt exam and gyn and to review chronic medical problems  has been feeling good   does have a cold - has had it since thursday congestion and cough - green phlegm -- more spitting then cough  cough is not too bad  st is mild - mainly irritated     wt is stable with bmi of 23  108/60 bp   hx of uterine fibroids--?  periods are not painful  regular but mild -- spots for few days and then lasts 3 days (just stopped this am )  ? hpv in past - unsure  last pap nl 1/11-normal    mam 10/11 nl hx of nipple d/c - no more  self exam - no lumps  not in need of birth control   Td 06   labs fine   stable chol Last Lipid ProfileCholesterol: 167 (02/06/2011 9:35:34 AM)HDL:  51.10 (02/06/2011 9:35:34 AM)LDL:  106 (02/06/2011 9:35:34 AM)Triglycerides:  Last Liver profileSGOT:  14 (02/06/2011 9:35:34 AM)SPGT:  15 (02/06/2011 9:35:34 AM)T. Bili:  0.5 (02/06/2011 9:35:34 AM)Alk Phos:  45 (02/06/2011 9:35:34 AM)  is still eating a very healthy diet   needs to get back to regular exercise       Allergies: No Known Drug Allergies  Past History:  Past Medical History: Last updated: 07/08/2008 Allergic rhinitis GERD/ Hiatal hernia small uterine fibroids ? anxiety  Past Surgical History: Last updated: 03/20/2007 Abd [place] - negative (07/2004) EGD- gastritis, esophagitis (09/2004) MRI/ MRA- sinusitis, white matter changes (10/2004) Facial numbness- neuro. work-up (01/2005) MCL sprain (2007)  Family History: Last updated: 11/08/2008 father, cerebral aneurysm M aunt breast ca aunt with ?  female ca No FH of Colon Cancer:  Social History: Last updated: 10/13/2008 non smoker rare alcohol exercises regularly  Risk Factors: Smoking Status: never (03/20/2007)  Review of Systems General:  Denies chills, fatigue, fever, loss of appetite, and malaise. Eyes:  Denies blurring and eye irritation. ENT:  Complains of postnasal drainage and sore throat. CV:  Denies chest pain or discomfort, palpitations, and shortness of breath with exertion. Resp:  Denies cough, shortness of breath, and wheezing. GI:  Denies abdominal pain, change in bowel habits, and indigestion. GU:  Denies abnormal vaginal bleeding, discharge, dysuria, and urinary frequency. MS:  Denies joint pain, joint redness, and joint swelling. Derm:  Denies itching, lesion(s), poor wound healing, and rash. Neuro:  Denies numbness and tingling. Psych:  Denies anxiety and depression. Endo:  Denies cold intolerance, excessive thirst, excessive urination, and heat intolerance. Heme:  Denies abnormal bruising and bleeding.  Physical Exam  General:  Well-developed,well-nourished,in no acute distress; alert,appropriate and cooperative throughout examination Head:  normocephalic, atraumatic, and no abnormalities observed.  no sinus tenderness  Eyes:  vision grossly intact, pupils equal, pupils round, pupils reactive to light, and no injection.   Ears:  R ear normal and L ear normal.  - mild cerumen wet bilat  Nose:  nares are mildly congested/ injected with clear rhinorrhea  Mouth:  pharynx pink and moist, no erythema, and no exudates.   Neck:  supple with full rom and no masses or thyromegally, no JVD or carotid bruit  Chest Wall:  No deformities, masses, or tenderness noted. Breasts:  No mass, nodules, thickening, tenderness, bulging, retraction, inflamation, nipple discharge or skin changes noted.  -- generally dense and fibrocystic  Lungs:  Normal respiratory effort, chest expands symmetrically. Lungs are clear to  auscultation, no crackles or wheezes. Heart:  Normal rate and regular rhythm. S1 and S2 normal without gallop, murmur, click, rub or other extra sounds. Abdomen:  Bowel sounds positive,abdomen soft and non-tender without masses, organomegaly or hernias noted. no renal bruits  Genitalia:  Normal introitus for age, no external lesions, no vaginal discharge, mucosa pink and moist, no vaginal or cervical lesions, no vaginal atrophy, no friaility or hemorrhage, normal uterus size and position, no adnexal masses or tenderness Msk:  No deformity or scoliosis noted of thoracic or lumbar spine.  no acute joint changes  Pulses:  R and L carotid,radial,femoral,dorsalis pedis and posterior tibial pulses are full and equal bilaterally Extremities:  No clubbing, cyanosis, edema, or deformity noted with normal full range of motion of all joints.   Neurologic:  sensation intact to light touch, gait normal, and DTRs symmetrical and normal.   Skin:  Intact without suspicious lesions or rashes Cervical Nodes:  No lymphadenopathy noted Axillary Nodes:  No palpable lymphadenopathy Inguinal Nodes:  No significant adenopathy Psych:  normal affect, talkative and pleasant    Impression & Recommendations:  Problem # 1:  HEALTH MAINTENANCE EXAM (ICD-V70.0) reviewed health habits including diet, exercise and skin cancer prevention reviewed health maintenance list and family history   Problem # 2:  GYNECOLOGICAL EXAMINATION, ROUTINE (ICD-V72.31) annual exam with pap  no need for contraception  no complaints  remote hx of hpv  Problem # 3:  FIBROCYSTIC BREAST DISEASE (ICD-610.1) mam up to date no further nipple d/c  Problem # 4:  VIRAL URI (ICD-465.9) Assessment: New uncomplicated  disc sympt care - see inst pt advised to update me if symptoms worsen or do not improve - esp if facial pain or fever  The following medications were removed from the medication list:    Guiatuss Ac 100-10 Mg/6ml Syrp  (Guaifenesin-codeine) .Marland Kitchen... 1-2 teaspoons up to every 4-6 hours as needed cough  watch out for sedation  Complete Medication List: 1)  Flonase 50 Mcg/act Susp (Fluticasone propionate) .... 2 sprays in each nostril once daily through allergy season  Patient Instructions: 1)  you can try mucinex over the counter twice daily as directed and nasal saline spray for congestion 2)  tylenol over the counter as directed may help with aches, headache and fever 3)  call if symptoms worsen or if not improved in 4-5 days  4)  keep up healthy diet - labs look good  5)  get back to exercise when you can    Orders Added: 1)  Est. Patient 40-64 years [99396] 2)  Est. Patient Level II [[address]]    Current Allergies (reviewed today): No known allergies

## 2011-02-28 NOTE — Letter (Signed)
Summary: Results Follow up Letter  [place] [facility]  [address] [place], [place] [address]   Phone: [phone]  Fax: [phone]    02/18/2011 MRN: [id number]    Anna [name] [address] [place], [place]  [address]  [place]    Dear [name],  The following are the results of your recent test(s):  Test         Result    Pap Smear:        Normal __X___  Not Normal _____ Comments: ______________________________________________________ Cholesterol: LDL(Bad cholesterol):         Your goal is less than:         HDL (Good cholesterol):       Your goal is more than: Comments:  ______________________________________________________ Mammogram:        Normal _____  Not Normal _____ Comments:  ___________________________________________________________________ Hemoccult:        Normal _____  Not normal _______ Comments:    _____________________________________________________________________ Other Tests:    We routinely do not discuss normal results over the telephone.  If you desire a copy of the results, or you have any questions about this information we can discuss them at your next office visit.   Sincerely,    Idamae Schuller Sahvannah Rieser,MD  MT/ri

## 2011-05-10 NOTE — Assessment & Plan Note (Signed)
[facility]                                   ON-CALL NOTE   NAME:MCGEEAlberto, Schoch                         MRN:          [id number]  DATE:11/05/2006                            DOB:          11/30/1966    The call came in at 6:04 p.m.   Phone number is [phone].   She is my patient.   CHIEF COMPLAINT:  Stomach virus.   Patient states that at 3:00 a.m. on Monday, she started to have diarrhea.  She was in the bathroom all night.  She went to work.  She was okay during  the day and then ended up back in the bathroom with diarrhea again.  She is  having watery stools.  They are sometimes frequent.  She is complaining of  hot and cold chills but does not know if she had a fever.  She is having  some abdominal cramping, not severe.  No vomiting but is a little nauseous  today and she wants to know what to do.   I told her that she could possibly have a stomach virus, to go ahead and sip  on clear fluids slowly and to eat small amounts of bland foods like crackers  or toast as needed if she is feeling hungry.  To just take it easy, take the  day off work tomorrow and rest and see how she does.  We reviewed signs and  symptoms of dehydration.  If she starts to have worsening abdominal pain,  diarrhea, or vomiting, she will call back.  If she is not starting to  improve by Friday morning, she will call the morning for appointment.  I did  tell her not to get any diarrhea medicine over the counter but to just stick  with clear liquids.  She will keep [place] updated and follow up if not improved.     Marne A. Tower, MD  Electronically Signed    MAT/MedQ  DD: 11/05/2006  DT: 11/05/2006  Job #: [id number]

## 2011-05-10 NOTE — Assessment & Plan Note (Signed)
[facility]                                   ON-CALL NOTE   NAME:MCGEE, Alexcis                         MRN:          [id number]  DATE:08/29/2006                            DOB:          1966/08/09    TIME CALL RECEIVED:  8:38 p.m.   TELEPHONE # [phone]   The patient has a medication question. She has a history of GE reflux  disease. She had taken Nexium for years but stopped about a month ago. When  she went to the pharmacy tonight to get a new one she was told that her  insurance no longer covers it. She wants to know what to do to provide an  alternative.  My response is to use Prilosec over-the-counter to get through  the weekend and then contact Dr. Royden Purl office next week.                                   Tera Mater. Clent Ridges, MD   SAF/MedQ  DD:  08/29/2006  DT:  08/30/2006  Job #:  [id number]   cc:   Marne A. Milinda Antis, MD

## 2011-08-16 ENCOUNTER — Encounter: Payer: Self-pay | Admitting: Family Medicine

## 2011-08-19 ENCOUNTER — Ambulatory Visit: Payer: Self-pay | Admitting: Family Medicine

## 2011-08-28 ENCOUNTER — Ambulatory Visit: Payer: Self-pay | Admitting: Family Medicine

## 2011-09-06 ENCOUNTER — Ambulatory Visit: Payer: Self-pay | Admitting: Family Medicine

## 2011-09-09 ENCOUNTER — Ambulatory Visit (INDEPENDENT_AMBULATORY_CARE_PROVIDER_SITE_OTHER): Payer: PRIVATE HEALTH INSURANCE | Admitting: Family Medicine

## 2011-09-09 ENCOUNTER — Encounter: Payer: Self-pay | Admitting: Family Medicine

## 2011-09-09 VITALS — BP 110/68 | HR 76 | Temp 97.9°F | Ht 63.5 in | Wt 138.2 lb

## 2011-09-09 DIAGNOSIS — N399 Disorder of urinary system, unspecified: Secondary | ICD-10-CM

## 2011-09-09 DIAGNOSIS — R82998 Other abnormal findings in urine: Secondary | ICD-10-CM

## 2011-09-09 DIAGNOSIS — R3989 Other symptoms and signs involving the genitourinary system: Secondary | ICD-10-CM

## 2011-09-09 DIAGNOSIS — R8290 Unspecified abnormal findings in urine: Secondary | ICD-10-CM

## 2011-09-09 DIAGNOSIS — N760 Acute vaginitis: Secondary | ICD-10-CM

## 2011-09-09 LAB — POCT UA - MICROSCOPIC ONLY

## 2011-09-09 LAB — POCT URINALYSIS DIPSTICK
Glucose, UA: NEGATIVE
Nitrite, UA: NEGATIVE
Urobilinogen, UA: 0.2
pH, UA: 6

## 2011-09-09 MED ORDER — TERCONAZOLE 0.8 % VA CREA: TOPICAL_CREAM | VAGINAL | Status: DC

## 2011-09-09 MED ORDER — FLUCONAZOLE 150 MG PO TABS: 150.0000 mg | ORAL_TABLET | Freq: Once | ORAL | Status: AC

## 2011-09-09 NOTE — Assessment & Plan Note (Signed)
With itching and slt d/c since July Unfortunately on menses so did not do wet prep  Hx is clinically consistent with yeast vaginitis tx with diflucan and external terazol cream Will update in 1 wk if no improvement

## 2011-09-09 NOTE — Patient Instructions (Signed)
Drink more water  Eat your yogurt every day  Less tub baths and dry well afterwards  Take diflucan and use terazol cream as needed Update me in 7 days if symptoms are not gone  Urine looks clear today

## 2011-09-09 NOTE — Progress Notes (Signed)
Subjective:    Patient ID: Anna [name], female    DOB: 10/01/1966, 45 y.o.   MRN: [id number]  HPI Here for symptoms of perineal itching and slt vaginal d/c as well as cloudy urine  Vaginal symptoms started at end of July  Was taking tub baths with scented products  Started itching outside and scant inside of vagina - all the time  Some vaginal discharge too -- was clear for the most part  Did not use any otc products  No odor  occ a litte pelvic discomfort- before menses   No suspicion of std - no intercourse at all since 1999  Urine has noticed looks cloudy - more than usual  Not drinking enough water   Is getting back to her regular habits  Exercise/ better diet and yogurt   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 HPV  . ALLERGIC RHINITIS  . GERD  . FIBROCYSTIC BREAST DISEASE  . URTICARIA  . Vaginitis  . Cloudy urine   Past Medical History  Diagnosis Date  . Allergic rhinitis   . GERD (gastroesophageal reflux disease)   . Hiatal hernia     EGD- gastritis, esophagitis 09/2004  . Uterine fibroid     small  . Anxiety       ?  Marland Kitchen Facial numbness 01/2005    neuro workup  . Knee MCL sprain 2007   No past surgical history on file. History  Substance Use Topics  . Smoking status: Never Smoker   . Smokeless tobacco: Not on file  . Alcohol Use: Yes     Rare   Family History  Problem Relation Age of Onset  . Aneurysm Father     cerebral  . Cancer Maternal Aunt     breast   No Known Allergies Current Outpatient Prescriptions on File Prior to Visit  Medication Sig Dispense Refill  . fluticasone (FLONASE) 50 MCG/ACT nasal spray 2 sprays in each nostril once daily through allergy season.            Review of Systems Review of Systems  Constitutional: Negative for fever,  appetite change, fatigue and unexpected weight change.  Eyes: Negative for pain and visual disturbance.  Respiratory: Negative for cough and shortness of breath.   Cardiovascular: Negative for cp or palpitations    Gastrointestinal: Negative for nausea, diarrhea and constipation.  Genitourinary: Negative for urgency and frequency. pos for vaginal itch and d/c Skin: Negative for pallor or rash   Neurological: Negative for weakness, light-headedness, numbness and headaches.  Hematological: Negative for adenopathy. Does not bruise/bleed easily.  Psychiatric/Behavioral: Negative for dysphoric mood. The patient is not nervous/anxious.          Objective:   Physical Exam  Constitutional: She appears well-developed and well-nourished. No distress.  HENT:  Head: Normocephalic and atraumatic.  Eyes: Conjunctivae and EOM are normal. Pupils are equal, round, and reactive to light.  Neck: Normal range of motion. Neck supple. No thyromegaly present.  Cardiovascular: Normal rate, regular rhythm and normal heart sounds.   Pulmonary/Chest: Effort normal and breath sounds normal. No respiratory distress. She has no wheezes.  Abdominal: Soft. Bowel sounds are normal. She exhibits no distension and no mass. There is no tenderness.  Genitourinary:  On menses so wet prep not obt Pt does uses pads instead of tampons  Musculoskeletal: She exhibits no edema.  Lymphadenopathy:    She has no cervical adenopathy.  Neurological: She is alert.  Skin: Skin is warm and dry. No rash noted. No erythema. No pallor.  Psychiatric: She has a normal mood and affect.          Assessment & Plan:

## 2011-09-09 NOTE — Assessment & Plan Note (Signed)
With neg ua today except blood - on menses  Pt was drinking less water this summer- expect that was the cause  Disc s/s uti to watch for and she will keep me updated and drink more water Also cut down tub baths

## 2011-09-10 LAB — URINALYSIS, MICROSCOPIC ONLY
Bacteria, UA: NONE SEEN
Casts: NONE SEEN
Crystals: NONE SEEN

## 2011-10-09 ENCOUNTER — Ambulatory Visit (INDEPENDENT_AMBULATORY_CARE_PROVIDER_SITE_OTHER): Payer: PRIVATE HEALTH INSURANCE | Admitting: Family Medicine

## 2011-10-09 ENCOUNTER — Encounter: Payer: Self-pay | Admitting: Family Medicine

## 2011-10-09 DIAGNOSIS — R21 Rash and other nonspecific skin eruption: Secondary | ICD-10-CM

## 2011-10-09 DIAGNOSIS — T148XXA Other injury of unspecified body region, initial encounter: Secondary | ICD-10-CM

## 2011-10-09 LAB — CBC WITH DIFFERENTIAL/PLATELET
Eosinophils Relative: 4.5 % (ref 0.0–5.0)
HCT: 40.2 % (ref 36.0–46.0)
Hemoglobin: 13.3 g/dL (ref 12.0–15.0)
Lymphocytes Relative: 30.1 % (ref 12.0–46.0)
Lymphs Abs: 1.4 10*3/uL (ref 0.7–4.0)
Monocytes Relative: 9.7 % (ref 3.0–12.0)
Neutro Abs: 2.5 10*3/uL (ref 1.4–7.7)
WBC: 4.6 10*3/uL (ref 4.5–10.5)

## 2011-10-09 LAB — PROTIME-INR: Prothrombin Time: 11.3 s (ref 10.2–12.4)

## 2011-10-09 LAB — APTT: aPTT: 25.1 s (ref 21.7–28.8)

## 2011-10-09 NOTE — Progress Notes (Signed)
Subjective:    Patient ID: Anna [name], female    DOB: Feb 02, 1966, 45 y.o.   MRN: [id number]  HPI Here for some bruising on thigh and itching of legs  Keeps getting bruising on her legs - cannot explain- no trauma  2-3 weeks ago  Has always bruised easily  Also itching on legs 2-3 weeks  Excessive itching -- ? Scratching could cause some bruising  No rash to see  Benadryl helps itch briefly  ? What is causing the itching   No traveling  No exp to bedbugs or fleas  No hand or foot itching  No scalp itching No one else in house is having problems   Patient Active Problem List  Diagnoses  . HPV  . ALLERGIC RHINITIS  . GERD  . FIBROCYSTIC BREAST DISEASE  . URTICARIA  . Vaginitis  . Cloudy urine  . Bruising  . Rash and nonspecific skin eruption   Past Medical History  Diagnosis Date  . Allergic rhinitis   . GERD (gastroesophageal reflux disease)   . Hiatal hernia     EGD- gastritis, esophagitis 09/2004  . Uterine fibroid     small  . Anxiety       ?  Marland Kitchen Facial numbness 01/2005    neuro workup  . Knee MCL sprain 2007   No past surgical history on file. History  Substance Use Topics  . Smoking status: Never Smoker   . Smokeless tobacco: Not on file  . Alcohol Use: Yes     Rare   Family History  Problem Relation Age of Onset  . Aneurysm Father     cerebral  . Cancer Maternal Aunt     breast   No Known Allergies Current Outpatient Prescriptions on File Prior to Visit  Medication Sig Dispense Refill  . diphenhydrAMINE (BENADRYL) 25 MG tablet Take 50 mg by mouth every 6 (six) hours as needed.        . fluticasone (FLONASE) 50 MCG/ACT nasal spray 2 sprays in each nostril once daily through allergy season.       Marland Kitchen terconazole (TERAZOL 3) 0.8 % vaginal cream Use externally to affected areas once daily as needed  20 g  0       Review of Systems Review of Systems  Constitutional: Negative for fever, appetite change, fatigue and unexpected weight change.    Eyes: Negative for pain and visual disturbance.  Respiratory: Negative for cough and shortness of breath.   Cardiovascular: Negative for cp or palpitations    Gastrointestinal: Negative for nausea, diarrhea and constipation.  Genitourinary: Negative for urgency and frequency.  Skin: Negative for pallor or cyanosis pos for bruising (just on legs)  Neurological: Negative for weakness, light-headedness, numbness and headaches.  Hematological: Negative for adenopathy. Does not bruise/bleed easily.  Psychiatric/Behavioral: Negative for dysphoric mood. The patient is not nervous/anxious.  -no new stressors         Objective:   Physical Exam  Constitutional: She appears well-developed and well-nourished. No distress.  HENT:  Head: Normocephalic and atraumatic.  Eyes: Conjunctivae and EOM are normal. Pupils are equal, round, and reactive to light.  Neck: Normal range of motion. Neck supple. No JVD present. Carotid bruit is not present.  Cardiovascular: Normal rate, regular rhythm, normal heart sounds and intact distal pulses.   Pulmonary/Chest: Effort normal and breath sounds normal. No respiratory distress. She has no wheezes.  Abdominal: There is no tenderness.       No rash on abdomen  Musculoskeletal: She exhibits no edema and no tenderness.  Lymphadenopathy:    She has no cervical adenopathy.  Neurological: She is alert. She has normal reflexes.  Skin: Skin is warm and dry. Rash noted. There is erythema. No pallor.       Diffuse erythema with faint reticular appearing rash on upper legs/ thighs - without discrete whelps or other lesions No excoriations or broken skin  No rash elsewhere incl web spaces of finger or toes or groin Multiple small 1-2 cm areas of ecchymosis of different ages on legs - mostly thigh area   Psychiatric: She has a normal mood and affect.          Assessment & Plan:

## 2011-10-09 NOTE — Patient Instructions (Signed)
For rash -- try zyrtec 10 mg over the counter once per day- should help the itch Keep cool (being hot makes you itch more)  Use dove for sensitive skin  Lotion without color or fragrance No perfume Use detergent with the word "free" in it  No fabric softener Moisturizer to arms also  Let me know if you get worse or rash changes or does not improve Some labs for bruising today

## 2011-10-10 NOTE — Assessment & Plan Note (Signed)
I suspect (in a female who already bruises easily)- that her ecchymoses are from excessive scratching of legs Will do clotting labs - platelet/pt/ptt to make sure no abn  Adv to avoid scratching as much as possible in addn to aspirin products

## 2011-10-10 NOTE — Assessment & Plan Note (Signed)
Faint rash and intense itching of upper legs - unknown etiology Disc all rxn to product or less likely food Will tx with zyrtec 10 mg otc daily  See inst- avoid heat Change to color and fragrance free products Then update if not imp in 1-2 wk

## 2011-10-22 ENCOUNTER — Telehealth: Payer: Self-pay | Admitting: *Deleted

## 2011-10-22 NOTE — Telephone Encounter (Signed)
Patient notified as instructed by telephone. Prilosec added to med list.

## 2011-10-22 NOTE — Telephone Encounter (Signed)
Pt states it feels like something is stuck in her throat.  She has had a problem with reflux in the past, is not taking anything presently for that.  She has prilosec at home, asks if she should try that.

## 2011-10-22 NOTE — Telephone Encounter (Signed)
Yes try prilosec 20 mg once daily in am  F/u in 2 weeks if not improved  Please add to med list-thanks

## 2011-10-23 ENCOUNTER — Ambulatory Visit: Payer: PRIVATE HEALTH INSURANCE | Admitting: Family Medicine

## 2011-11-25 ENCOUNTER — Telehealth: Payer: Self-pay | Admitting: Internal Medicine

## 2011-11-25 NOTE — Telephone Encounter (Signed)
Patient called and stated she was advised by nurse that to take prilosec for when every time she swallowed she felt like something in her throat.  Now she stated since taking Prilosec it helped with that problem but now she is having burning in her throat and a bitter taste. So she is wondering if the prilosec is strong enough or should she be switched to something else.  Please advise.

## 2011-11-25 NOTE — Telephone Encounter (Signed)
Increase it to bid and follow up please when able

## 2011-11-25 NOTE — Telephone Encounter (Signed)
Patient notified as instructed by telephone. Pt scheduled appt 12/02/11 at 12:30pm.

## 2011-12-02 ENCOUNTER — Ambulatory Visit: Payer: PRIVATE HEALTH INSURANCE | Admitting: Family Medicine

## 2011-12-09 ENCOUNTER — Encounter: Payer: Self-pay | Admitting: Family Medicine

## 2011-12-09 ENCOUNTER — Ambulatory Visit (INDEPENDENT_AMBULATORY_CARE_PROVIDER_SITE_OTHER): Payer: PRIVATE HEALTH INSURANCE | Admitting: Family Medicine

## 2011-12-09 VITALS — BP 104/70 | HR 76 | Temp 98.3°F | Ht 63.5 in | Wt 137.5 lb

## 2011-12-09 DIAGNOSIS — K219 Gastro-esophageal reflux disease without esophagitis: Secondary | ICD-10-CM

## 2011-12-09 MED ORDER — ESOMEPRAZOLE MAGNESIUM 40 MG PO CPDR: 40.0000 mg | DELAYED_RELEASE_CAPSULE | Freq: Every day | ORAL | Status: DC

## 2011-12-09 NOTE — Assessment & Plan Note (Addendum)
Worse lately since stressful project at school this summer -- this seemed to trigger it  No meds for a while - but pos EGD for gerd and gastritis in past Reviewed old records and studies with pt today Now not symptom free with prilosec bid - so will try nexium inst pt to let me know if not symptom free in 2 weeks  Otherwise f/u 3 months  Will be on look out for dysphagia again Pt given handout on GERD

## 2011-12-09 NOTE — Patient Instructions (Signed)
Stop the prilosec (omeprazole) since it is not fully relieving symptoms Avoid acidic/ spicy food and beverage and caffeine and alcohol  Try nexium 40 mg each am before breakfast In 2 weeks if you are not symptom free- call  Otherwise follow up in 3 month

## 2011-12-09 NOTE — Progress Notes (Signed)
Subjective:    Patient ID: Anna [name], female    DOB: 09/10/1966, 45 y.o.   MRN: [id number]  HPI Here for f/u of acid reflux worse lately Called with swallowing problem again- she had this in past and saw GI  This time symptoms returned around august intermittently -then worse and more persistant  At one time choked on water persistant mucous sensation in throat  prilosec helped this but still having significant other symptoms- with heartburn- burning in throat and acid taste in mouth  More now than in the past  No longer has nausea  prilosec 20 mg - took for 14 days- it did help with swallowing  Then increased to bid -- and that helped more   Not totally gone  Heartburn yesterday   Is pretty careful about what she eats and drinks Non smoker  No junk food  No coffee Avoids caffeine  Fried foods - not often at all  Not a lot of acidic food and bev- occ tomato sauce   No nsaids occ eats at night  Symptoms are any time of day    EGD 05 showed granular mucosa consistent with gerd and gastritis  After that was treated and was symptom free for a while  Was on nexium for a while - stopped it when she felt better   Patient Active Problem List  Diagnoses  . HPV  . ALLERGIC RHINITIS  . GERD  . FIBROCYSTIC BREAST DISEASE  . URTICARIA  . Cloudy urine  . Bruising  . Rash and nonspecific skin eruption   Past Medical History  Diagnosis Date  . Allergic rhinitis   . GERD (gastroesophageal reflux disease)   . Hiatal hernia     EGD- gastritis, esophagitis 09/2004  . Uterine fibroid     small  . Anxiety       ?  Marland Kitchen Facial numbness 01/2005    neuro workup  . Knee MCL sprain 2007   No past surgical history on file. History  Substance Use Topics  . Smoking status: Never Smoker   . Smokeless tobacco: Not on file  . Alcohol Use: Yes     Rare   Family History  Problem Relation Age of Onset  . Aneurysm Father     cerebral  . Cancer Maternal Aunt     breast   No  Known Allergies Current Outpatient Prescriptions on File Prior to Visit  Medication Sig Dispense Refill  . diphenhydrAMINE (BENADRYL) 25 MG tablet Take 50 mg by mouth every 6 (six) hours as needed.        . fluticasone (FLONASE) 50 MCG/ACT nasal spray 2 sprays in each nostril once daily through allergy season.       Marland Kitchen terconazole (TERAZOL 3) 0.8 % vaginal cream Use externally to affected areas once daily as needed  20 g  0      Review of Systems Review of Systems  Constitutional: Negative for fever, appetite change, fatigue and unexpected weight change.  Eyes: Negative for pain and visual disturbance.  Respiratory: Negative for cough and shortness of breath.   Cardiovascular: Negative for cp or palpitations    Gastrointestinal: Negative for nausea, diarrhea and constipation pos for heartburn/ indigestion/ epigastric discomfort and occ trouble swallowing , neg for blood in stool or dark stools .  Genitourinary: Negative for urgency and frequency.  Skin: Negative for pallor or rash   Neurological: Negative for weakness, light-headedness, numbness and headaches.  Hematological: Negative for adenopathy. Does not  bruise/bleed easily.  Psychiatric/Behavioral: Negative for dysphoric mood. The patient is not nervous/anxious.  Pos for stressors        Objective:   Physical Exam  Constitutional: She appears well-developed and well-nourished. No distress.  HENT:  Head: Normocephalic and atraumatic.  Mouth/Throat: Oropharynx is clear and moist.  Eyes: Conjunctivae and EOM are normal. Pupils are equal, round, and reactive to light. No scleral icterus.  Neck: Normal range of motion. Neck supple. No JVD present. No thyromegaly present.  Cardiovascular: Normal rate, regular rhythm and normal heart sounds.   No murmur heard. Pulmonary/Chest: Effort normal and breath sounds normal. No respiratory distress. She has no wheezes. She exhibits no tenderness.  Abdominal: Soft. Bowel sounds are normal.  She exhibits no distension and no mass. There is no tenderness. There is no rebound and no guarding.  Musculoskeletal: She exhibits no edema.  Lymphadenopathy:    She has no cervical adenopathy.  Neurological: She is alert. She has normal reflexes.  Skin: Skin is warm and dry. No rash noted. No erythema. No pallor.  Psychiatric: She has a normal mood and affect.       Does not seem overly anxious or stressed Nl eye contact and comm skills  Pleasant  Mildly fatigued           Assessment & Plan:

## 2011-12-19 ENCOUNTER — Other Ambulatory Visit: Payer: Self-pay | Admitting: Family Medicine

## 2011-12-19 DIAGNOSIS — Z1231 Encounter for screening mammogram for malignant neoplasm of breast: Secondary | ICD-10-CM

## 2012-01-01 ENCOUNTER — Telehealth: Payer: Self-pay | Admitting: Internal Medicine

## 2012-01-01 DIAGNOSIS — R21 Rash and other nonspecific skin eruption: Secondary | ICD-10-CM

## 2012-01-01 NOTE — Telephone Encounter (Signed)
Derm ref done

## 2012-01-01 NOTE — Telephone Encounter (Signed)
Patient notified as instructed by telephone. Pt is agreeable to go for dermatology referral and pt will call back after she speaks with insurance co.

## 2012-01-01 NOTE — Telephone Encounter (Signed)
I think we should refer her to dermatology for the itching  prilosec was not working well enough - have her call her insurance co to see what other PPI is covered well enough to try

## 2012-01-01 NOTE — Telephone Encounter (Signed)
Patient called and stated the same bruising she had on her legs from itching she has on both breasts and she did stated it itches and she is scratching it she just wanted to make sure that it was from an allergic reaction like on her legs.  Also she stated the Nexium that you Rx was to expensive and she went back to Prilosec and wanted to know if you could write her a Rx for this.  She is scheduled for a mammogram on the 22nd of this month.

## 2012-01-14 ENCOUNTER — Ambulatory Visit
Admission: RE | Admit: 2012-01-14 | Discharge: 2012-01-14 | Disposition: A | Discharge status: Home / Self Care | Payer: PRIVATE HEALTH INSURANCE | Source: Ambulatory Visit | Attending: Family Medicine | Admitting: Family Medicine

## 2012-01-14 DIAGNOSIS — Z1231 Encounter for screening mammogram for malignant neoplasm of breast: Secondary | ICD-10-CM

## 2012-01-15 ENCOUNTER — Encounter: Payer: Self-pay | Admitting: *Deleted

## 2012-03-30 ENCOUNTER — Telehealth: Payer: Self-pay | Admitting: Family Medicine

## 2012-03-30 DIAGNOSIS — Z0000 Encounter for general adult medical examination without abnormal findings: Secondary | ICD-10-CM

## 2012-03-30 NOTE — Telephone Encounter (Signed)
Message copied by Judy Pimple on Mon Mar 30, 2012 10:31 PM ------      Message from: Alvina Chou      Created: Mon Mar 30, 2012  5:30 PM      Regarding: labs for 4.10.13       Patient is scheduled for CPX labs, please order future labs, Thanks , Camelia Eng

## 2012-04-01 ENCOUNTER — Other Ambulatory Visit (INDEPENDENT_AMBULATORY_CARE_PROVIDER_SITE_OTHER): Payer: PRIVATE HEALTH INSURANCE

## 2012-04-01 DIAGNOSIS — Z0000 Encounter for general adult medical examination without abnormal findings: Secondary | ICD-10-CM

## 2012-04-01 LAB — COMPREHENSIVE METABOLIC PANEL
Alkaline Phosphatase: 44 U/L (ref 39–117)
BUN: 12 mg/dL (ref 6–23)
Creatinine, Ser: 0.7 mg/dL (ref 0.4–1.2)
Glucose, Bld: 88 mg/dL (ref 70–99)
Sodium: 140 mEq/L (ref 135–145)
Total Bilirubin: 0.3 mg/dL (ref 0.3–1.2)
Total Protein: 6.8 g/dL (ref 6.0–8.3)

## 2012-04-01 LAB — CBC WITH DIFFERENTIAL/PLATELET
Eosinophils Relative: 4.2 % (ref 0.0–5.0)
HCT: 37.8 % (ref 36.0–46.0)
Lymphs Abs: 2 10*3/uL (ref 0.7–4.0)
MCV: 88.1 fl (ref 78.0–100.0)
Monocytes Absolute: 0.6 10*3/uL (ref 0.1–1.0)
Neutro Abs: 3.4 10*3/uL (ref 1.4–7.7)
Platelets: 352 10*3/uL (ref 150.0–400.0)
WBC: 6.3 10*3/uL (ref 4.5–10.5)

## 2012-04-01 LAB — LIPID PANEL
Cholesterol: 181 mg/dL (ref 0–200)
HDL: 56.9 mg/dL (ref >39.00)
LDL Cholesterol: 111 mg/dL — ABNORMAL HIGH (ref 0–99)
Triglycerides: 68 mg/dL (ref 0.0–149.0)
VLDL: 13.6 mg/dL (ref 0.0–40.0)

## 2012-04-07 ENCOUNTER — Encounter: Payer: Self-pay | Admitting: Family Medicine

## 2012-04-07 ENCOUNTER — Ambulatory Visit (INDEPENDENT_AMBULATORY_CARE_PROVIDER_SITE_OTHER): Payer: PRIVATE HEALTH INSURANCE | Admitting: Family Medicine

## 2012-04-07 ENCOUNTER — Telehealth: Payer: Self-pay | Admitting: Family Medicine

## 2012-04-07 ENCOUNTER — Other Ambulatory Visit (HOSPITAL_COMMUNITY)
Admission: RE | Admit: 2012-04-07 | Discharge: 2012-04-07 | Disposition: A | Discharge status: Home / Self Care | Payer: PRIVATE HEALTH INSURANCE | Source: Ambulatory Visit | Attending: Family Medicine | Admitting: Family Medicine

## 2012-04-07 VITALS — BP 104/64 | HR 72 | Temp 98.7°F | Ht 63.5 in | Wt 132.8 lb

## 2012-04-07 DIAGNOSIS — Z01419 Encounter for gynecological examination (general) (routine) without abnormal findings: Secondary | ICD-10-CM

## 2012-04-07 DIAGNOSIS — Z0000 Encounter for general adult medical examination without abnormal findings: Secondary | ICD-10-CM

## 2012-04-07 NOTE — Assessment & Plan Note (Signed)
Annual exam Remote hx of HPV Pap done Hx of fibroid- no change in exam and no complaints

## 2012-04-07 NOTE — Progress Notes (Signed)
Subjective:    Patient ID: Anna [name], female    DOB: August 11, 1966, 46 y.o.   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 Is excited about that  Wt is down 5 lb with bmi of 23 Is taking care of herself - is eating healthy diet  Also just started back exercising 1 mo ago -- bodies in motion program   Good labs  Lab Results  Component Value Date   CHOL 181 04/01/2012   HDL 56.90 04/01/2012   LDLCALC 111* 04/01/2012   LDLDIRECT 123.8 10/13/2008   TRIG 68.0 04/01/2012   CHOLHDL 3 04/01/2012   eats a healthy diet overall   Flu shot- did not get  Td was 06  mammo 1/13 Self exam no lumps or changes  Saw derm earlier for urticaria - left a discoloration on both of her breasts  She did scratch   Hx of HPV Pap 2/12 Is due for that today  Periods are normal every 28 days- lasting 4 days  No need for contraception  No worries about stds- not sexually active   Patient Active Problem List  Diagnoses  . HPV  . ALLERGIC RHINITIS  . GERD  . FIBROCYSTIC BREAST DISEASE  . Cloudy urine  . Bruising  . Routine general medical examination at a health care facility  . Gynecological examination   Past Medical History  Diagnosis Date  . Allergic rhinitis   . GERD (gastroesophageal reflux disease)   . Hiatal hernia     EGD- gastritis, esophagitis 09/2004  . Uterine fibroid     small  . Anxiety       ?  Marland Kitchen Facial numbness 01/2005    neuro workup  . Knee MCL sprain 2007   No past surgical history on file. History  Substance Use Topics  . Smoking status: Never Smoker   . Smokeless tobacco: Not on file  . Alcohol Use: Yes     Rare   Family History  Problem Relation Age of Onset  . Aneurysm Father     cerebral  . Cancer Maternal Aunt     breast   No Known Allergies Current Outpatient  Prescriptions on File Prior to Visit  Medication Sig Dispense Refill  . cetirizine (ZYRTEC) 10 MG tablet Take 10 mg by mouth daily as needed.        . diphenhydrAMINE (BENADRYL) 25 MG tablet Take 50 mg by mouth every 6 (six) hours as needed.        . fluticasone (FLONASE) 50 MCG/ACT nasal spray 2 sprays in each nostril once daily through allergy season.             Review of Systems Review of Systems  Constitutional: Negative for fever, appetite change, fatigue and unexpected weight change.  Eyes: Negative for pain and visual disturbance.  Respiratory: Negative for cough and shortness of breath.   Cardiovascular: Negative for cp or palpitations    Gastrointestinal: Negative for nausea, diarrhea and constipation.  Genitourinary: Negative for urgency and frequency.  Skin: Negative for pallor or rash   Neurological: Negative for weakness, light-headedness, numbness and headaches.  Hematological: Negative for adenopathy. Does not bruise/bleed easily.  Psychiatric/Behavioral: Negative for dysphoric mood. The patient is not nervous/anxious.          Objective:  Physical Exam  Constitutional: She appears well-developed and well-nourished. No distress.  HENT:  Head: Normocephalic and atraumatic.  Right Ear: External ear normal.  Left Ear: External ear normal.  Nose: Nose normal.  Mouth/Throat: Oropharynx is clear and moist.  Eyes: Conjunctivae and EOM are normal. Pupils are equal, round, and reactive to light. No scleral icterus.  Neck: Normal range of motion. Neck supple. No JVD present. Carotid bruit is not present. No thyromegaly present.  Cardiovascular: Normal rate, regular rhythm, normal heart sounds and intact distal pulses.  Exam reveals no gallop.   Pulmonary/Chest: Effort normal and breath sounds normal. No respiratory distress. She has no wheezes.  Abdominal: Soft. Bowel sounds are normal. She exhibits no distension, no abdominal bruit and no mass. There is no tenderness.    Genitourinary: Rectum normal, vagina normal and uterus normal. No breast swelling, tenderness or bleeding. There is no rash or tenderness on the right labia. There is no rash or tenderness on the left labia. Uterus is not enlarged and not tender. Cervix exhibits no motion tenderness, no discharge and no friability. Right adnexum displays no mass, no tenderness and no fullness. Left adnexum displays no mass, no tenderness and no fullness. No bleeding around the vagina. No vaginal discharge found.  Musculoskeletal: Normal range of motion. She exhibits no edema and no tenderness.  Lymphadenopathy:    She has no cervical adenopathy.  Neurological: She is alert. She has normal reflexes. No cranial nerve deficit. She exhibits normal muscle tone. Coordination normal.  Skin: Skin is warm and dry. No rash noted. No erythema. No pallor.  Psychiatric: She has a normal mood and affect.          Assessment & Plan:

## 2012-04-07 NOTE — Assessment & Plan Note (Signed)
Reviewed health habits including diet and exercise and skin cancer prevention Also reviewed health mt list, fam hx and immunizations  Rev wellness labs incl chol in detail Disc diet and use of supplements

## 2012-04-07 NOTE — Telephone Encounter (Signed)
When I do a physical I have to check off anything on the problem list that may be discussed  Then after the visit before billing - I delete the things we did not do and prioritize the primary dx (for her will be health mt exam )  This is done after the visit - since I do not have time to do it during the visit  Her primary dx will be for health mt exam and gyn exam  The hpv will be taken off because it was not in play (I had hx from old chart that she had had hpv in the past - which helps me determine how often to do her pap smear) Bottom line is- it will all change before I do the charges  Good questions

## 2012-04-07 NOTE — Patient Instructions (Addendum)
Keep working on healthy habits - diet and exercise Pap done today Labs look good  Stay off acid reflux medicine if you do not need it - but let us know if you do

## 2012-04-07 NOTE — Telephone Encounter (Signed)
Caller: Dimonique/Patient; PCP: Roxy Manns A.; CB#: [phone]; Call regarding Questions Regarding Dx At Phys. Today.;  Pt calling inquiring why HPV was primary dx on physical today, 4/16. Pt states 1. HPV was not discussed at all and she has no previous dx of HPV. and 2. Physical MUST be the PRIMARY dx of visit today or pt's insurance with not cover it. Pt had the same problem last year. Requests a call back to # above asap on 4/17.

## 2012-04-07 NOTE — Assessment & Plan Note (Signed)
This has improved and off PPI Will continue to follow

## 2012-04-07 NOTE — Assessment & Plan Note (Signed)
Pap done today- yearly

## 2012-04-07 NOTE — Telephone Encounter (Signed)
See prior

## 2012-04-07 NOTE — Telephone Encounter (Signed)
Called patient on cell phone number and the mailbox is full, I will call back tomorrow.

## 2012-04-08 NOTE — Telephone Encounter (Signed)
Patient advised as instructed via telephone. 

## 2012-04-09 ENCOUNTER — Encounter: Payer: Self-pay | Admitting: Family Medicine

## 2012-04-09 ENCOUNTER — Encounter: Payer: Self-pay | Admitting: *Deleted

## 2012-08-28 ENCOUNTER — Ambulatory Visit: Payer: PRIVATE HEALTH INSURANCE | Admitting: Family Medicine

## 2012-08-31 ENCOUNTER — Encounter: Payer: Self-pay | Admitting: Family Medicine

## 2012-08-31 ENCOUNTER — Ambulatory Visit (INDEPENDENT_AMBULATORY_CARE_PROVIDER_SITE_OTHER): Payer: 59 | Admitting: Family Medicine

## 2012-08-31 VITALS — BP 122/74 | HR 76 | Temp 97.8°F | Wt 131.0 lb

## 2012-08-31 DIAGNOSIS — N390 Urinary tract infection, site not specified: Secondary | ICD-10-CM

## 2012-08-31 DIAGNOSIS — R109 Unspecified abdominal pain: Secondary | ICD-10-CM

## 2012-08-31 LAB — POCT URINALYSIS DIPSTICK
Bilirubin, UA: NEGATIVE
Ketones, UA: NEGATIVE
Protein, UA: NEGATIVE
Spec Grav, UA: 1.015
pH, UA: 6

## 2012-08-31 LAB — POCT UA - MICROSCOPIC ONLY
Casts, Ur, LPF, POC: 0
Yeast, UA: 0

## 2012-08-31 MED ORDER — CIPROFLOXACIN HCL 250 MG PO TABS: 250.0000 mg | ORAL_TABLET | Freq: Two times a day (BID) | ORAL | Status: AC

## 2012-08-31 NOTE — Patient Instructions (Addendum)
Drink lots of water Take cipro as directed We will update you when culture returns In meantime if worse - please call

## 2012-08-31 NOTE — Progress Notes (Signed)
Subjective:    Patient ID: Anna [name], female    DOB: 26-Mar-1966, 46 y.o.   MRN: [id number]  HPI Has had some pain in lower abdomen  On and off since early July First time sharp pain- woke her up from sleep- lasted 30-60 min- then got better and she went back to sleep Lately has had pressure in low abdomen- one night last week was sharp   The pain has moved back and forth-sometimes starting on the R  Feels fine right now  No hx of ovarian cysts  No vaginal discharge or itching   No dysuria , but does have constant burning sensation in the area No frequency  No blood in urine  LMP was 3 weeks ago   She is having regular bowel movements - sometime BM will help pressure   Patient Active Problem List  Diagnosis  . ALLERGIC RHINITIS  . GERD  . FIBROCYSTIC BREAST DISEASE  . Cloudy urine  . Bruising  . Routine general medical examination at a health care facility  . Gynecological examination   Past Medical History  Diagnosis Date  . Allergic rhinitis   . GERD (gastroesophageal reflux disease)   . Hiatal hernia     EGD- gastritis, esophagitis 09/2004  . Uterine fibroid     small  . Anxiety       ?  Marland Kitchen Facial numbness 01/2005    neuro workup  . Knee MCL sprain 2007  . History of HPV infection    No past surgical history on file. History  Substance Use Topics  . Smoking status: Never Smoker   . Smokeless tobacco: Not on file  . Alcohol Use: Yes     Rare   Family History  Problem Relation Age of Onset  . Aneurysm Father     cerebral  . Cancer Maternal Aunt     breast   No Known Allergies Current Outpatient Prescriptions on File Prior to Visit  Medication Sig Dispense Refill  . cetirizine (ZYRTEC) 10 MG tablet Take 10 mg by mouth daily as needed.        . diphenhydrAMINE (BENADRYL) 25 MG tablet Take 50 mg by mouth every 6 (six) hours as needed.        . fluticasone (FLONASE) 50 MCG/ACT nasal spray 2 sprays in each nostril once daily through allergy season.             Review of Systems Review of Systems  Constitutional: Negative for fever, appetite change, fatigue and unexpected weight change.  Eyes: Negative for pain and visual disturbance.  Respiratory: Negative for cough and shortness of breath.   Cardiovascular: Negative for cp or palpitations    Gastrointestinal: Negative for nausea, diarrhea and constipation. pos for low abd pain  Genitourinary: Negative for urgency and frequency. pos for burning with urination, neg for blood Skin: Negative for pallor or rash   Neurological: Negative for weakness, light-headedness, numbness and headaches.  Hematological: Negative for adenopathy. Does not bruise/bleed easily.  Psychiatric/Behavioral: Negative for dysphoric mood. The patient is not nervous/anxious.         Objective:   Physical Exam  Constitutional: She appears well-developed and well-nourished. No distress.  HENT:  Head: Normocephalic and atraumatic.  Mouth/Throat: Oropharynx is clear and moist.  Eyes: Conjunctivae normal and EOM are normal. Pupils are equal, round, and reactive to light.  Neck: Normal range of motion. Neck supple.  Cardiovascular: Normal rate and regular rhythm.   Pulmonary/Chest: Effort normal and breath  sounds normal. No respiratory distress. She has no wheezes.  Abdominal: Soft. Bowel sounds are normal. She exhibits no distension and no mass. There is tenderness. There is no rebound and no guarding.       Mild suprapubic tenderness  Musculoskeletal: She exhibits no edema.       No cva tenderness   Lymphadenopathy:    She has no cervical adenopathy.  Neurological: She is alert. She has normal reflexes.  Skin: Skin is warm and dry. No rash noted. No erythema. No pallor.  Psychiatric: She has a normal mood and affect.          Assessment & Plan:

## 2012-08-31 NOTE — Assessment & Plan Note (Signed)
Uncomplicated with pelvic/ bladder pressure and pain Handout given tx with cipro  Urine cx pending Update if not starting to improve in a week or if worsening

## 2012-12-11 ENCOUNTER — Ambulatory Visit: Payer: 59 | Admitting: Family Medicine

## 2013-02-23 ENCOUNTER — Other Ambulatory Visit: Payer: Self-pay

## 2013-02-23 DIAGNOSIS — Z1231 Encounter for screening mammogram for malignant neoplasm of breast: Secondary | ICD-10-CM

## 2013-03-10 ENCOUNTER — Ambulatory Visit: Payer: 59

## 2013-03-15 ENCOUNTER — Ambulatory Visit (INDEPENDENT_AMBULATORY_CARE_PROVIDER_SITE_OTHER): Payer: 59 | Admitting: Gynecology

## 2013-03-15 ENCOUNTER — Encounter: Payer: Self-pay | Admitting: Gynecology

## 2013-03-15 VITALS — BP 110/74 | Ht 64.0 in | Wt 136.0 lb

## 2013-03-15 DIAGNOSIS — R0789 Other chest pain: Secondary | ICD-10-CM

## 2013-03-15 DIAGNOSIS — N949 Unspecified condition associated with female genital organs and menstrual cycle: Secondary | ICD-10-CM

## 2013-03-15 DIAGNOSIS — R102 Pelvic and perineal pain: Secondary | ICD-10-CM

## 2013-03-15 DIAGNOSIS — D259 Leiomyoma of uterus, unspecified: Secondary | ICD-10-CM

## 2013-03-15 DIAGNOSIS — R071 Chest pain on breathing: Secondary | ICD-10-CM

## 2013-03-15 DIAGNOSIS — R1900 Intra-abdominal and pelvic swelling, mass and lump, unspecified site: Secondary | ICD-10-CM

## 2013-03-15 NOTE — Progress Notes (Signed)
Anna [name] 1966/08/07 [id number]        47 y.o.  [id number] new patient complaining of fleeting lower abdominal discomfort.  Has regular monthly menses lasting 4-5 days with light to medium flow. No intermenstrual bleeding. Not sexually active for years. Most recent annual exam per Dr. Lucretia Roers office April 2013 with a normal reported pelvic exam and normal Pap smear. Patient notes approximately July 2013 the onset of this discomfort which sometimes is right lower quadrant and other times left lower quadrant. Lasts for several hours cramping in nature to sharp stabbing. Does seem to be relieved with a bowel movement. Is not having chronic constipation or diarrhea. No nausea vomiting or urinary symptoms associated with the pain. Does have history of small myomas documented on ultrasound 2008. All measured less than 1.5 cm. Also notes chest wall pain bilateral following lifting weights with her younger daughter. Present for approximately 2 weeks. No abnormalities on self breast exam.   Past medical history,surgical history, medications, allergies, family history and social history were all reviewed and documented in the EPIC chart. ROS:  Was performed and pertinent positives and negatives are included in the history.  Exam: [name]  assistant Filed Vitals:   03/15/13 1510  BP: 110/74  Height: 5\' 4"  (1.626 m)  Weight: 136 lb (61.689 kg)   General appearance  Normal Skin grossly normal Abdominal  soft, nontender, without masses, organomegaly or hernia Breasts  examined lying and sitting without masses, retractions, discharge or axillary adenopathy.  Discomfort noted along the medial pectoralis insertion line bilaterally.  Pelvic  Ext/BUS/vagina  normal   Cervix  normal anterior low in the vagina   Bimanual with Pelvic mass approximately 14 week size somewhat cystic in feel. Minimally tender. Difficult to differentiate uterus from the mass.  Anus and perineum  normal   Rectovaginal  normal sphincter  tone. Confirms above mass.   Assessment/Plan:  47 y.o. [id number] female   1. Fleeting pelvic pain since July 2013. Appears to have GI association with relief with bowel movement.  Exam shows pelvic mass. Questionable leiomyoma versus ovarian versus non-GYN. Does have history of small myomas documented on ultrasound previously. We'll start with ultrasound for better definition. Patient will follow up with me after this is scheduled. Check baseline urinalysis.  Differential was reviewed with the patient.  Unsure whether the pain is related to the mass or GI. May consider gastroenterology referral pending ultrasound results. 2. Bilateral chest wall tenderness following weight lifting. I think this is musculoskeletal. Her breast exams to normal and the pain seems to be more in the insertion sites from her pectoralis muscle. Patient does have her screening mammogram scheduled will followup for this. Do not feel at this point any further workup needed. She will apply heat and nonsteroidal anti-inflammatories.   Dara Lords MD, 3:46 PM 03/15/2013

## 2013-03-15 NOTE — Patient Instructions (Signed)
Follow up for ultrasound as scheduled 

## 2013-03-16 ENCOUNTER — Ambulatory Visit
Admission: RE | Admit: 2013-03-16 | Discharge: 2013-03-16 | Disposition: A | Discharge status: Home / Self Care | Payer: 59 | Source: Ambulatory Visit

## 2013-03-16 DIAGNOSIS — Z1231 Encounter for screening mammogram for malignant neoplasm of breast: Secondary | ICD-10-CM

## 2013-03-16 LAB — URINALYSIS W MICROSCOPIC + REFLEX CULTURE
Hgb urine dipstick: NEGATIVE
Leukocytes, UA: NEGATIVE
Nitrite: NEGATIVE
Protein, ur: NEGATIVE mg/dL
Urobilinogen, UA: 1 mg/dL (ref 0.0–1.0)

## 2013-03-17 ENCOUNTER — Encounter: Payer: Self-pay | Admitting: *Deleted

## 2013-03-19 ENCOUNTER — Ambulatory Visit (INDEPENDENT_AMBULATORY_CARE_PROVIDER_SITE_OTHER): Payer: 59 | Admitting: Gynecology

## 2013-03-19 ENCOUNTER — Encounter: Payer: Self-pay | Admitting: Gynecology

## 2013-03-19 ENCOUNTER — Ambulatory Visit (INDEPENDENT_AMBULATORY_CARE_PROVIDER_SITE_OTHER): Payer: 59

## 2013-03-19 ENCOUNTER — Other Ambulatory Visit: Payer: Self-pay | Admitting: Gynecology

## 2013-03-19 DIAGNOSIS — R102 Pelvic and perineal pain: Secondary | ICD-10-CM

## 2013-03-19 DIAGNOSIS — N852 Hypertrophy of uterus: Secondary | ICD-10-CM

## 2013-03-19 DIAGNOSIS — D259 Leiomyoma of uterus, unspecified: Secondary | ICD-10-CM

## 2013-03-19 DIAGNOSIS — R1900 Intra-abdominal and pelvic swelling, mass and lump, unspecified site: Secondary | ICD-10-CM

## 2013-03-19 DIAGNOSIS — N949 Unspecified condition associated with female genital organs and menstrual cycle: Secondary | ICD-10-CM

## 2013-03-19 NOTE — Patient Instructions (Addendum)
Follow up in 4 monhs for repeat ultrasound    Fibroids Fibroids are lumps (tumors) that can occur any place in a woman's body. These lumps are not cancerous. Fibroids vary in size, weight, and where they grow. HOME CARE  Do not take aspirin.  Write down the number of pads or tampons you use during your period. Tell your doctor. This can help determine the best treatment for you. GET HELP RIGHT AWAY IF:  You have pain in your lower belly (abdomen) that is not helped with medicine.  You have cramps that are not helped with medicine.  You have more bleeding between or during your period.  You feel lightheaded or pass out (faint).  Your lower belly pain gets worse. MAKE SURE YOU:  Understand these instructions.  Will watch your condition.  Will get help right away if you are not doing well or get worse. Document Released: 01/11/2011 Document Revised: 03/02/2012 Document Reviewed: 01/11/2011 [facility] Patient Information 2013 [place], [place].

## 2013-03-19 NOTE — Progress Notes (Signed)
Patient presents for ultrasound followup.  Was seen several days ago with fleeting pelvic abdominal pain. Does have history of small myomas 1.5 cm on 2008 ultrasound.  Ultrasound shows uterus enlarged with several myomas the largest measuring 65 mm. Endometrial echo 7.0 mm.  Right and left ovaries visualized and normal. Cul-de-sac negative.  Assessment and plan: Leiomyoma, enlarging. Relatively asymptomatic noting regular light menses. Question relationship to her fleeting pain. Recommend observation at present with repeat exam and ultrasound in 4 months. Patient will keep pain/menstrual calendar we'll see how she's doing.  Leiomyosarcoma unlikely possibility also discussed. Patient will follow up in 4 months and we'll go from there. If she has any issues prior to that she'll represent at that time.

## 2013-04-12 ENCOUNTER — Ambulatory Visit: Payer: 59 | Admitting: Gynecology

## 2013-04-12 ENCOUNTER — Encounter: Payer: Self-pay | Admitting: Gynecology

## 2013-04-18 ENCOUNTER — Telehealth: Payer: Self-pay | Admitting: Family Medicine

## 2013-04-18 DIAGNOSIS — Z0000 Encounter for general adult medical examination without abnormal findings: Secondary | ICD-10-CM

## 2013-04-18 NOTE — Telephone Encounter (Signed)
Message copied by Judy Pimple on Sun Apr 18, 2013 12:03 PM ------      Message from: Alvina Chou      Created: Tue Apr 13, 2013 11:32 AM      Regarding: Lab orders for Monday, 4.28.14       Patient is scheduled for CPX labs, please order future labs, Thanks , Terri       ------

## 2013-04-19 ENCOUNTER — Other Ambulatory Visit (INDEPENDENT_AMBULATORY_CARE_PROVIDER_SITE_OTHER): Payer: 59

## 2013-04-19 DIAGNOSIS — Z0000 Encounter for general adult medical examination without abnormal findings: Secondary | ICD-10-CM

## 2013-04-19 LAB — COMPREHENSIVE METABOLIC PANEL
Alkaline Phosphatase: 41 U/L (ref 39–117)
BUN: 12 mg/dL (ref 6–23)
CO2: 28 mEq/L (ref 19–32)
Creatinine, Ser: 0.7 mg/dL (ref 0.4–1.2)
GFR: 110.56 mL/min (ref >60.00)
Glucose, Bld: 84 mg/dL (ref 70–99)
Sodium: 140 mEq/L (ref 135–145)
Total Bilirubin: 0.6 mg/dL (ref 0.3–1.2)

## 2013-04-19 LAB — CBC WITH DIFFERENTIAL/PLATELET
Basophils Absolute: 0 10*3/uL (ref 0.0–0.1)
Hemoglobin: 13.3 g/dL (ref 12.0–15.0)
Lymphocytes Relative: 31.2 % (ref 12.0–46.0)
Monocytes Relative: 10.8 % (ref 3.0–12.0)
Neutro Abs: 2.8 10*3/uL (ref 1.4–7.7)
Neutrophils Relative %: 54.6 % (ref 43.0–77.0)
RBC: 4.54 Mil/uL (ref 3.87–5.11)
RDW: 13.5 % (ref 11.5–14.6)

## 2013-04-19 LAB — LIPID PANEL
Cholesterol: 175 mg/dL (ref 0–200)
HDL: 59 mg/dL (ref >39.00)
Triglycerides: 62 mg/dL (ref 0.0–149.0)
VLDL: 12.4 mg/dL (ref 0.0–40.0)

## 2013-04-19 LAB — TSH: TSH: 1.14 u[IU]/mL (ref 0.35–5.50)

## 2013-04-19 NOTE — Addendum Note (Signed)
Addended by: Alvina Chou on: 04/19/2013 10:50 AM   Modules accepted: Orders

## 2013-04-23 ENCOUNTER — Ambulatory Visit (INDEPENDENT_AMBULATORY_CARE_PROVIDER_SITE_OTHER): Payer: 59 | Admitting: Gynecology

## 2013-04-23 ENCOUNTER — Telehealth: Payer: Self-pay

## 2013-04-23 ENCOUNTER — Encounter: Payer: Self-pay | Admitting: Gynecology

## 2013-04-23 VITALS — BP 120/70 | Ht 64.0 in | Wt 131.0 lb

## 2013-04-23 DIAGNOSIS — Z01419 Encounter for gynecological examination (general) (routine) without abnormal findings: Secondary | ICD-10-CM

## 2013-04-23 DIAGNOSIS — Z113 Encounter for screening for infections with a predominantly sexual mode of transmission: Secondary | ICD-10-CM

## 2013-04-23 DIAGNOSIS — N814 Uterovaginal prolapse, unspecified: Secondary | ICD-10-CM

## 2013-04-23 DIAGNOSIS — D259 Leiomyoma of uterus, unspecified: Secondary | ICD-10-CM

## 2013-04-23 NOTE — Telephone Encounter (Signed)
I called patient to schedule surgery. She needs to check with her job regarding when she can be out for surgery.  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  We also discussed ins benefits and her surgery prepymt amount to [employer].  She will consider dates and call me next week. I provided her with my direct phone number.

## 2013-04-23 NOTE — Patient Instructions (Signed)
Office will contact you to arrange surgery. 

## 2013-04-23 NOTE — Progress Notes (Signed)
Anna [name] 1966-05-23 [id number]        47 y.o.  [id number] for annual exam.  Several issues noted below.  Past medical history,surgical history, medications, allergies, family history and social history were all reviewed and documented in the EPIC chart. ROS:  Was performed and pertinent positives and negatives are included in the history.  Exam: [name] assistant Filed Vitals:   04/23/13 1102  BP: 120/70  Height: 5\' 4"  (1.626 m)  Weight: 131 lb (59.421 kg)   General appearance  Normal Skin grossly normal Head/Neck normal with no cervical or supraclavicular adenopathy thyroid normal Lungs  clear Cardiac RR, without RMG Abdominal  soft, nontender, without masses, organomegaly or hernia Breasts  examined lying and sitting without masses, retractions, discharge or axillary adenopathy. Pelvic  Ext/BUS/vagina  normal   Cervix  normal within 1 fingerbreadth of introitus  Uterus  12 weeks, bulky irregular consistent with leiomyoma, tender to manipulation  Adnexa  Without masses or tenderness    Anus and perineum  normal   Rectovaginal  normal sphincter tone without palpated masses or tenderness.    Assessment/Plan:  47 y.o. [id number] female for annual exam.   1. Leiomyoma. Patient notes symptoms of pressure with cramping coming and going as well as dyspareunia. Menses are otherwise regular flow to light and regular. Uterus is bulky and low in the pelvis accounting for her pressure and pain symptoms. Options for management reviewed with her to include observation myomectomy hysterectomy. Childbearing is no longer an issue in fact she needs consistent contraception she plans no further children and after a lengthy discussion she wants to proceed with hysterectomy because of her enlarging myomas, daily pelvic pressure and dyspareunia symptoms. I think we can start with attempted [facility] although her uterus is bulky and have her prepared for LAVH assistance including TAH. Patient was to go ahead and  schedule this and we'll move toward surgery. 2. STD screening. Patient getting married and asked about having baseline STD screening. No known exposure. GC Chlamydia, RPR, hepatitis B, hepatitis C and HIV ordered. 3. Pap smear 03/2012. No Pap smear done today. No history of abnormal Pap smears previously. Discussed current screening guidelines we'll plan every 3 year interval. 4. Mammogram 02/2013. Continue with annual mammography. SBE monthly reviewed. 5. Health maintenance. Recently had exam with full blood work through Dr. Lucretia Roers office. Most recent hemoglobin was noted at 13. Patient will followup for surgery as scheduled.    Dara Lords MD, 11:42 AM 04/23/2013

## 2013-04-24 LAB — URINALYSIS W MICROSCOPIC + REFLEX CULTURE
Bacteria, UA: NONE SEEN
Casts: NONE SEEN
Crystals: NONE SEEN
Nitrite: NEGATIVE
Specific Gravity, Urine: 1.025 (ref 1.005–1.030)
Urobilinogen, UA: 1 mg/dL (ref 0.0–1.0)
pH: 6 (ref 5.0–8.0)

## 2013-04-24 LAB — GC/CHLAMYDIA PROBE AMP
CT Probe RNA: NEGATIVE
GC Probe RNA: NEGATIVE

## 2013-04-24 LAB — RPR

## 2013-04-24 LAB — HIV ANTIBODY (ROUTINE TESTING W REFLEX): HIV: NONREACTIVE

## 2013-04-26 ENCOUNTER — Encounter: Payer: Self-pay | Admitting: Family Medicine

## 2013-04-26 ENCOUNTER — Other Ambulatory Visit: Payer: Self-pay | Admitting: Gynecology

## 2013-04-26 ENCOUNTER — Ambulatory Visit (INDEPENDENT_AMBULATORY_CARE_PROVIDER_SITE_OTHER): Payer: 59 | Admitting: Family Medicine

## 2013-04-26 VITALS — BP 118/66 | HR 82 | Temp 98.4°F | Ht 63.75 in | Wt 129.8 lb

## 2013-04-26 DIAGNOSIS — Z23 Encounter for immunization: Secondary | ICD-10-CM

## 2013-04-26 DIAGNOSIS — Z0000 Encounter for general adult medical examination without abnormal findings: Secondary | ICD-10-CM

## 2013-04-26 MED ORDER — SULFAMETHOXAZOLE-TMP DS 800-160 MG PO TABS: 1.0000 | ORAL_TABLET | Freq: Two times a day (BID) | ORAL | Status: DC

## 2013-04-26 NOTE — Assessment & Plan Note (Signed)
Reviewed health habits including diet and exercise and skin cancer prevention Also reviewed health mt list, fam hx and immunizations  Reviewed wellness labs in detail Tdap vaccine today in light of upcoming newborn in family

## 2013-04-26 NOTE — Patient Instructions (Addendum)
Your potassium was a little bit low - so make sure to get some high potassium foods in diet like citrus fruit / OJ/ banana/ cantelope Good luck with your surgery  Take care of yourself

## 2013-04-26 NOTE — Progress Notes (Signed)
Subjective:    Patient ID: Anna [name], female    DOB: June 21, 1966, 47 y.o.   MRN: [id number]  HPI Here for health maintenance exam and to review chronic medical problems    Is doing fine overall   Wt is down 2 lb with bmi of 22 Eats a fairly healthy diet - and picked up exercise again - working with bodies in motion program- likes it a lot , also weights and calethenics   Saw gyn on 5/2-had exam without pap  Nl pap 4/13 Contemplating hysterectomy for symptomatic fibroids -in process with picking a date with surgery  Flu vaccine - did not get this season  Td 9/06  K level was 3.4-- she will work to eat more of that   mammo 3/14 - was ok  Self exam - no lumps and she just had exam from gyn   Labs are otherwise ok   Lab Results  Component Value Date   CHOL 175 04/19/2013   CHOL 181 04/01/2012   CHOL 167 02/06/2011   Lab Results  Component Value Date   HDL 59.00 04/19/2013   HDL 40.98 04/01/2012   HDL 11.91 02/06/2011   Lab Results  Component Value Date   LDLCALC 104* 04/19/2013   LDLCALC 111* 04/01/2012   LDLCALC 106* 02/06/2011   Lab Results  Component Value Date   TRIG 62.0 04/19/2013   TRIG 68.0 04/01/2012   TRIG 49.0 02/06/2011   Lab Results  Component Value Date   CHOLHDL 3 04/19/2013   CHOLHDL 3 04/01/2012   CHOLHDL 3 02/06/2011   Lab Results  Component Value Date   LDLDIRECT 123.8 10/13/2008   overall a great profile   Patient Active Problem List   Diagnosis Date Noted  . Routine general medical examination at a health care facility 04/18/2013  . ALLERGIC RHINITIS 03/20/2007  . GERD 03/20/2007  . FIBROCYSTIC BREAST DISEASE 03/20/2007   Past Medical History  Diagnosis Date  . Allergic rhinitis   . GERD (gastroesophageal reflux disease)   . Hiatal hernia     EGD- gastritis, esophagitis 09/2004  . 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 Knee MCL sprain 2007  . History of HPV infection   . Genital warts   . Uterine fibroid    No past surgical  history on file. History  Substance Use Topics  . Smoking status: Never Smoker   . Smokeless tobacco: Not on file  . Alcohol Use: No   Family History  Problem Relation Age of Onset  . Aneurysm Father     cerebral  . Breast cancer Maternal Aunt     50's  . Cancer Paternal Aunt     Uterine   No Known Allergies Current Outpatient Prescriptions on File Prior to Visit  Medication Sig Dispense Refill  . cetirizine (ZYRTEC) 10 MG tablet Take 10 mg by mouth daily as needed.        . diphenhydrAMINE (BENADRYL) 25 MG tablet Take 50 mg by mouth every 6 (six) hours as needed.        . fluticasone (FLONASE) 50 MCG/ACT nasal spray 2 sprays in each nostril once daily through allergy season.        No current facility-administered medications on file prior to visit.       Review of Systems Review of Systems  Constitutional: Negative for fever, appetite change, fatigue and unexpected weight change.  Eyes: Negative for pain and visual disturbance.  Respiratory: Negative for cough  and shortness of breath.   Cardiovascular: Negative for cp or palpitations    Gastrointestinal: Negative for nausea, diarrhea and constipation.  Genitourinary: Negative for urgency and frequency. pos for uterine cramps and heavy menses from fibroids Skin: Negative for pallor or rash   Neurological: Negative for weakness, light-headedness, numbness and headaches.  Hematological: Negative for adenopathy. Does not bruise/bleed easily.  Psychiatric/Behavioral: Negative for dysphoric mood. The patient is not nervous/anxious.         Objective:   Physical Exam  Constitutional: She appears well-developed and well-nourished. No distress.  HENT:  Head: Normocephalic and atraumatic.  Right Ear: External ear normal.  Left Ear: External ear normal.  Nose: Nose normal.  Mouth/Throat: Oropharynx is clear and moist.  Eyes: Conjunctivae and EOM are normal. Pupils are equal, round, and reactive to light. Right eye exhibits  no discharge. Left eye exhibits no discharge. No scleral icterus.  Neck: Normal range of motion. Neck supple. No JVD present. Carotid bruit is not present. No thyromegaly present.  Cardiovascular: Normal rate, regular rhythm, normal heart sounds and intact distal pulses.  Exam reveals no gallop.   Pulmonary/Chest: Effort normal and breath sounds normal. No respiratory distress. She has no wheezes. She has no rales.  Abdominal: Soft. Bowel sounds are normal. She exhibits no distension, no abdominal bruit and no mass. There is no tenderness.  Musculoskeletal: Normal range of motion. She exhibits no edema and no tenderness.  Lymphadenopathy:    She has no cervical adenopathy.  Neurological: She is alert. She has normal reflexes. No cranial nerve deficit. She exhibits normal muscle tone. Coordination normal.  Skin: Skin is warm and dry. No rash noted. No erythema. No pallor.  Acne scars noted  Psychiatric: She has a normal mood and affect.          Assessment & Plan:

## 2013-06-07 ENCOUNTER — Encounter: Payer: Self-pay | Admitting: Gynecology

## 2013-06-07 ENCOUNTER — Ambulatory Visit (INDEPENDENT_AMBULATORY_CARE_PROVIDER_SITE_OTHER): Payer: 59 | Admitting: Gynecology

## 2013-06-07 DIAGNOSIS — R102 Pelvic and perineal pain: Secondary | ICD-10-CM

## 2013-06-07 DIAGNOSIS — D259 Leiomyoma of uterus, unspecified: Secondary | ICD-10-CM

## 2013-06-07 DIAGNOSIS — N949 Unspecified condition associated with female genital organs and menstrual cycle: Secondary | ICD-10-CM

## 2013-06-07 NOTE — Progress Notes (Signed)
The patient presents complaining of increased lower pelvic discomfort fleeting in nature. Does have a history of leiomyoma with planned hysterectomy in August. Menses continue regular.  Exam with [name] assistant Abdomen soft nontender without masses guarding rebound organomegaly. Pelvic external BUS vagina with cervix within 1 fingerbreadth of the introitus. Uterus bulky approximately 14 weeks size. Irregular. Adnexa without gross masses or tenderness.  Assessment and plan: Leiomyoma causing pelvic discomfort. She is planned for attempted vaginal hysterectomy in August. I reviewed with patient that her uterus does appear to be old but larger at 14 weeks size. I more concerned that we'll not be able to accomplish this vaginally. My recommendation would be consider Depo-Lupron and planned surgery 8-12 weeks after the shot. Side effect profile to include menopausal symptoms and accelerated bone loss discussed. Patient agrees with going with this and we'll arrange with her next menses and then plan surgery at the 8-12 weeks window. Need for contraception in the interim with condoms stressed.

## 2013-06-07 NOTE — Patient Instructions (Signed)
Office will contact you to arrange for Depo-Lupron shot. We will also then schedule your surgery based upon this.

## 2013-06-11 ENCOUNTER — Telehealth: Payer: Self-pay

## 2013-06-11 NOTE — Telephone Encounter (Signed)
I received a call from [employer] [employer] that I needed to call verbal Rx into [facility] for patient's Lupron 11.25mg .  When I called [facility] they said they already had the Rx and there had been no need to call. The rep did suggest that I might ask the patient to call them as they need to talk  With her and they will be trying to contact her to discuss benefits and get ok to ship.  I contacted Anna [name] and explained this and provided her with their phone number.    Anna [name] and I discussed that her Aug 5th surgery date will nee to be rescheduled to 2nd or 3rd week of Sept.  She said she needs to check her calendar and she will call me next week to reschedule.

## 2013-06-21 ENCOUNTER — Telehealth: Payer: Self-pay

## 2013-06-21 NOTE — Telephone Encounter (Addendum)
Patient's menses started yesterday.  We had been in the process of arranging Lupron but patient said she did not like reviews that she read about it.  SHe wants to know could you go ahead as scheduled on August 5 and try to do [facility].  She said you had told her it might be possible to do it without Lupron.

## 2013-06-21 NOTE — Telephone Encounter (Signed)
The main advantage of Depo-Lupron would be to improve the success rate going vaginally. If she accepts the increased possibility of a TAH then I would post her as an LAVH. It is up to her. Let me know if she needs surgery slip put through.

## 2013-06-22 NOTE — Telephone Encounter (Signed)
Bikini incision

## 2013-06-22 NOTE — Telephone Encounter (Signed)
Please see note below regarding patient's inquiry regarding incision. thanks

## 2013-06-22 NOTE — Telephone Encounter (Signed)
Planned procedure is LAVH

## 2013-06-22 NOTE — Telephone Encounter (Addendum)
Patient has decided to move forward with vag hyst.  We discussed that she does accept the increased risk of TAH.  We discussed TAH and recovery.  Patient asked regarding where the TAH incision will be and how long would it be? ("bikini incision?" she asked)    You had already sent me surgery order and she was scheduled Aug 5 1:00pm prior to the Lupron discussion.  That order said [facility] with LAVH standby but I will change it as indicated below to LAVH.

## 2013-06-22 NOTE — Telephone Encounter (Signed)
Left message for patient to call me

## 2013-06-22 NOTE — Telephone Encounter (Signed)
Patient informed. 

## 2013-07-12 ENCOUNTER — Telehealth: Payer: Self-pay | Admitting: *Deleted

## 2013-07-12 NOTE — Telephone Encounter (Signed)
Possible. Regardless would not do anything different but proceed with surgery.

## 2013-07-12 NOTE — Telephone Encounter (Signed)
Please see below note. Pt asked me to relay information to you if this could be related to fibroids? Please advise

## 2013-07-12 NOTE — Telephone Encounter (Signed)
Pt informed with the below note. 

## 2013-07-12 NOTE — Telephone Encounter (Signed)
Pt scheduled for TAH on 07/27/13 had intercourse last night and noticed some bleeding after intercourse only. When going to bathroom and wiping no blood. Her cycle is due to start on 08/17/13. This am no bleeding as well, has had spotting in past after intercourse but last night more blood than spotting.

## 2013-07-21 ENCOUNTER — Ambulatory Visit: Payer: 59 | Admitting: Gynecology

## 2013-07-21 ENCOUNTER — Other Ambulatory Visit: Payer: 59

## 2013-07-23 ENCOUNTER — Ambulatory Visit (INDEPENDENT_AMBULATORY_CARE_PROVIDER_SITE_OTHER): Payer: 59 | Admitting: Gynecology

## 2013-07-23 ENCOUNTER — Ambulatory Visit (INDEPENDENT_AMBULATORY_CARE_PROVIDER_SITE_OTHER): Payer: 59

## 2013-07-23 ENCOUNTER — Encounter: Payer: Self-pay | Admitting: Gynecology

## 2013-07-23 ENCOUNTER — Other Ambulatory Visit: Payer: Self-pay | Admitting: Gynecology

## 2013-07-23 DIAGNOSIS — N949 Unspecified condition associated with female genital organs and menstrual cycle: Secondary | ICD-10-CM

## 2013-07-23 DIAGNOSIS — D259 Leiomyoma of uterus, unspecified: Secondary | ICD-10-CM

## 2013-07-23 DIAGNOSIS — N938 Other specified abnormal uterine and vaginal bleeding: Secondary | ICD-10-CM

## 2013-07-23 DIAGNOSIS — N946 Dysmenorrhea, unspecified: Secondary | ICD-10-CM

## 2013-07-23 DIAGNOSIS — R102 Pelvic and perineal pain: Secondary | ICD-10-CM

## 2013-07-23 DIAGNOSIS — D251 Intramural leiomyoma of uterus: Secondary | ICD-10-CM

## 2013-07-23 NOTE — Progress Notes (Signed)
Anna [name] [birth year] [id number]   Preoperative consult  Chief complaint: leiomyoma, pelvic pain, dyspareunia  History of present illness: 47 y.o. [id number] long history of enlarging leiomyoma now causing pelvic pain and dyspareunia for attempted LAVH possible TAH.  Past medical history,surgical history, medications, allergies, family history and social history were all reviewed and documented in the EPIC chart. ROS:  Was performed and pertinent positives and negatives are included in the history of present illness.  Exam:  [name] assistant General: well developed, well nourished female, no acute distress HEENT: normal  Lungs: clear to auscultation without wheezing, rales or rhonchi  Cardiac: regular rate without rubs, murmurs or gallops  Abdomen: soft, nontender without masses, guarding, rebound, organomegaly  Pelvic: external bus vagina: normal   Cervix: grossly normal  Uterus: 14 week size, midline and mobile, nontender  Adnexa: without masses or tenderness    Assessment/Plan:  47 y.o. [id number] with history of enlarging leiomyoma now symptomatic causing pelvic pressure, discomfort and consistent deep dyspareunia. Having regular menses relatively light. Exam with 14 week size irregular uterus. Ultrasound 07/23/2013 with multiple myomas demonstrated the largest 89 mm. Right and left ovaries appear normal. Options for management were reviewed to include observation, uterine artery embolization, myomectomy, hysterectomy. Patient elects for hysterectomy.  She is a gravida 4 para 4 with a mobile although enlarged uterus and the options to attempt TVH/LAVH versus TAH was discussed. Options for Depo-Lupron pretreatment in attempt to shrink her myomas to increase her success rate vaginally was discussed but the patient does not want to do this and postpone surgery and accepts the risk of TAH. She understands I will examine her under anesthesia and then make a decision. She understands that with TAH  this would be a larger incision with a longer recovery.  The ovarian conservation issue was also discussed with her she desires to keep both ovaries. She does give me permission to remove one or both ovaries intraoperatively if significant disease is encountered or complications arise. The risk of ovarian conservation to include ovarian cancer in the future as well as benign ovarian disease requiring reoperation or treatment was discussed understood and accepted.  In general what is involved with hysterectomy was reviewed to include the expected intraoperative and postoperative courses as well as the recovery period.  Absolute irreversible sterility associated with hysterectomy was discussed. Sexuality following hysterectomy to include potential for persistent orgasmic dysfunction and persistent dyspareunia discussed understood and accepted. The risks of infection, abscess and hematoma formation requiring reoperation and drainage was reviewed. Incisional complications including opening and draining of incisions, closure by secondary intention, long term issues such as keloid/cosmetics and hernia formation discussed. The risk of hemorrhage necessitating transfusion and the risks of transfusion reviewed to include transfusion reaction, hepatitis, HIV, mad cow disease and other unknown entities were reviewed and accepted. The risk of inadvertent injury to internal organs either immediately recognized or delay recognized including bowel, bladder, ureters, vessels and nerves necessitating major exploratory reparative surgeries and future reparative surgeries including ostomy formation, bowel resection, bladder repair, ureteral damage repair was also discussed understood and accepted. The patient's questions were answered to her satisfaction she is ready to proceed with surgery.   Note: This document was prepared with digital dictation and possible smart phrase technology. Any transcriptional errors that result from  this process are unintentional.  Dara Lords MD, 10:01 AM 07/23/2013

## 2013-07-23 NOTE — Patient Instructions (Signed)
followup for surgery is scheduled

## 2013-07-23 NOTE — H&P (Signed)
  Anna [name] 1966/05/19 [id number]   History and Physical  Chief complaint: leiomyoma, pelvic pain, dyspareunia  History of present illness: 47 y.o. [id number] long history of enlarging leiomyoma now causing pelvic pain and dyspareunia for attempted LAVH possible TAH.  Past medical history,surgical history, medications, allergies, family history and social history were all reviewed and documented in the EPIC chart. ROS:  Was performed and pertinent positives and negatives are included in the history of present illness.  Exam:  [name] assistant General: well developed, well nourished female, no acute distress HEENT: normal  Lungs: clear to auscultation without wheezing, rales or rhonchi  Cardiac: regular rate without rubs, murmurs or gallops  Abdomen: soft, nontender without masses, guarding, rebound, organomegaly  Pelvic: external bus vagina: normal   Cervix: grossly normal  Uterus: 14 week size, midline and mobile, nontender  Adnexa: without masses or tenderness    Assessment/Plan:  47 y.o. [id number] with history of enlarging leiomyoma now symptomatic causing pelvic pressure, discomfort and consistent deep dyspareunia. Having regular menses relatively light. Exam with 14 week size irregular uterus. Ultrasound 07/23/2013 with multiple myomas demonstrated the largest 89 mm. Right and left ovaries appear normal. Options for management were reviewed to include observation, uterine artery embolization, myomectomy, hysterectomy. Patient elects for hysterectomy.  She is a gravida 4 para 4 with a mobile although enlarged uterus and the options to attempt TVH/LAVH versus TAH was discussed. Options for Depo-Lupron pretreatment in attempt to shrink her myomas to increase her success rate vaginally was discussed but the patient does not want to do this and postpone surgery and accepts the risk of TAH. She understands I will examine her under anesthesia and then make a decision. She understands that with TAH  this would be a larger incision with a longer recovery.  The ovarian conservation issue was also discussed with her she desires to keep both ovaries. She does give me permission to remove one or both ovaries intraoperatively if significant disease is encountered or complications arise. The risk of ovarian conservation to include ovarian cancer in the future as well as benign ovarian disease requiring reoperation or treatment was discussed understood and accepted.  In general what is involved with hysterectomy was reviewed to include the expected intraoperative and postoperative courses as well as the recovery period.  Absolute irreversible sterility associated with hysterectomy was discussed. Sexuality following hysterectomy to include potential for persistent orgasmic dysfunction and persistent dyspareunia discussed understood and accepted. The risks of infection, abscess and hematoma formation requiring reoperation and drainage was reviewed. Incisional complications including opening and draining of incisions, closure by secondary intention, long term issues such as keloid/cosmetics and hernia formation discussed. The risk of hemorrhage necessitating transfusion and the risks of transfusion reviewed to include transfusion reaction, hepatitis, HIV, mad cow disease and other unknown entities were reviewed and accepted. The risk of inadvertent injury to internal organs either immediately recognized or delay recognized including bowel, bladder, ureters, vessels and nerves necessitating major exploratory reparative surgeries and future reparative surgeries including ostomy formation, bowel resection, bladder repair, ureteral damage repair was also discussed understood and accepted. The patient's questions were answered to her satisfaction she is ready to proceed with surgery.   Note: This document was prepared with digital dictation and possible smart phrase technology. Any transcriptional errors that result from  this process are unintentional.  Dara Lords MD, 10:13 AM 07/23/2013

## 2013-07-26 ENCOUNTER — Encounter (HOSPITAL_COMMUNITY): Payer: Self-pay | Admitting: Pharmacy Technician

## 2013-07-26 MED ORDER — DEXTROSE 5 % IV SOLN
2.0000 g | INTRAVENOUS | Status: AC
  Administered 2013-07-27: 2 g via INTRAVENOUS
  Filled 2013-07-26: qty 2

## 2013-07-27 ENCOUNTER — Encounter (HOSPITAL_COMMUNITY)
Admission: RE | Disposition: A | Discharge status: Home / Self Care | Payer: Self-pay | Source: Ambulatory Visit | Attending: Gynecology

## 2013-07-27 ENCOUNTER — Encounter (HOSPITAL_COMMUNITY): Payer: Self-pay | Admitting: Anesthesiology

## 2013-07-27 ENCOUNTER — Ambulatory Visit (HOSPITAL_COMMUNITY): Payer: 59 | Admitting: Anesthesiology

## 2013-07-27 ENCOUNTER — Observation Stay (HOSPITAL_COMMUNITY)
Admission: RE | Admit: 2013-07-27 | Discharge: 2013-07-28 | Disposition: A | Discharge status: Home / Self Care | Payer: 59 | Source: Ambulatory Visit | Attending: Gynecology | Admitting: Gynecology

## 2013-07-27 DIAGNOSIS — D259 Leiomyoma of uterus, unspecified: Secondary | ICD-10-CM

## 2013-07-27 DIAGNOSIS — N949 Unspecified condition associated with female genital organs and menstrual cycle: Secondary | ICD-10-CM

## 2013-07-27 DIAGNOSIS — IMO0002 Reserved for concepts with insufficient information to code with codable children: Secondary | ICD-10-CM | POA: Insufficient documentation

## 2013-07-27 DIAGNOSIS — D251 Intramural leiomyoma of uterus: Principal | ICD-10-CM | POA: Insufficient documentation

## 2013-07-27 DIAGNOSIS — D252 Subserosal leiomyoma of uterus: Secondary | ICD-10-CM | POA: Insufficient documentation

## 2013-07-27 HISTORY — PX: LAPAROSCOPIC ASSISTED VAGINAL HYSTERECTOMY: SHX5398

## 2013-07-27 LAB — CBC
HCT: 39.1 % (ref 36.0–46.0)
MCV: 85.6 fL (ref 78.0–100.0)
Platelets: 355 10*3/uL (ref 150–400)
RBC: 4.57 MIL/uL (ref 3.87–5.11)
WBC: 7.4 10*3/uL (ref 4.0–10.5)

## 2013-07-27 SURGERY — HYSTERECTOMY, VAGINAL, LAPAROSCOPY-ASSISTED
Anesthesia: General | Site: Abdomen | Wound class: Clean Contaminated

## 2013-07-27 MED ORDER — MIDAZOLAM HCL 2 MG/2ML IJ SOLN
INTRAMUSCULAR | Status: AC
  Filled 2013-07-27: qty 2

## 2013-07-27 MED ORDER — ROCURONIUM BROMIDE 50 MG/5ML IV SOLN
INTRAVENOUS | Status: AC
  Filled 2013-07-27: qty 1

## 2013-07-27 MED ORDER — MIDAZOLAM HCL 5 MG/5ML IJ SOLN
INTRAMUSCULAR | Status: DC | PRN
  Administered 2013-07-27: 2 mg via INTRAVENOUS

## 2013-07-27 MED ORDER — PROPOFOL 10 MG/ML IV BOLUS
INTRAVENOUS | Status: DC | PRN
  Administered 2013-07-27: 150 mg via INTRAVENOUS

## 2013-07-27 MED ORDER — GLYCOPYRROLATE 0.2 MG/ML IJ SOLN
INTRAMUSCULAR | Status: DC | PRN
  Administered 2013-07-27: 0.4 mg via INTRAVENOUS

## 2013-07-27 MED ORDER — LIDOCAINE-EPINEPHRINE 1 %-1:100000 IJ SOLN
INTRAMUSCULAR | Status: DC | PRN
  Administered 2013-07-27: 15 mL

## 2013-07-27 MED ORDER — LACTATED RINGERS IR SOLN
Status: DC | PRN
  Administered 2013-07-27: 3000 mL

## 2013-07-27 MED ORDER — HYDROMORPHONE HCL PF 1 MG/ML IJ SOLN
INTRAMUSCULAR | Status: AC
  Administered 2013-07-27: 0.5 mg via INTRAVENOUS
  Filled 2013-07-27: qty 1

## 2013-07-27 MED ORDER — DEXAMETHASONE SODIUM PHOSPHATE 10 MG/ML IJ SOLN
INTRAMUSCULAR | Status: DC | PRN
  Administered 2013-07-27: 10 mg via INTRAVENOUS

## 2013-07-27 MED ORDER — DEXTROSE-NACL 5-0.9 % IV SOLN
INTRAVENOUS | Status: DC
  Administered 2013-07-27: via INTRAVENOUS

## 2013-07-27 MED ORDER — KETOROLAC TROMETHAMINE 30 MG/ML IJ SOLN: 30.0000 mg | Freq: Four times a day (QID) | INTRAMUSCULAR | Status: DC

## 2013-07-27 MED ORDER — FENTANYL CITRATE 0.05 MG/ML IJ SOLN
INTRAMUSCULAR | Status: DC | PRN
  Administered 2013-07-27 (×2): 50 ug via INTRAVENOUS
  Administered 2013-07-27 (×2): 100 ug via INTRAVENOUS

## 2013-07-27 MED ORDER — BUPIVACAINE HCL (PF) 0.25 % IJ SOLN
INTRAMUSCULAR | Status: AC
  Filled 2013-07-27: qty 30

## 2013-07-27 MED ORDER — NEOSTIGMINE METHYLSULFATE 1 MG/ML IJ SOLN
INTRAMUSCULAR | Status: DC | PRN
  Administered 2013-07-27: 2 mg via INTRAVENOUS

## 2013-07-27 MED ORDER — LACTATED RINGERS IV SOLN
INTRAVENOUS | Status: DC
  Administered 2013-07-27 (×4): via INTRAVENOUS

## 2013-07-27 MED ORDER — PROPOFOL 10 MG/ML IV EMUL
INTRAVENOUS | Status: AC
  Filled 2013-07-27: qty 20

## 2013-07-27 MED ORDER — GLYCOPYRROLATE 0.2 MG/ML IJ SOLN
INTRAMUSCULAR | Status: AC
  Filled 2013-07-27: qty 3

## 2013-07-27 MED ORDER — HYDROMORPHONE HCL PF 1 MG/ML IJ SOLN
0.2500 mg | INTRAMUSCULAR | Status: DC | PRN
  Administered 2013-07-27: 0.5 mg via INTRAVENOUS

## 2013-07-27 MED ORDER — DEXAMETHASONE SODIUM PHOSPHATE 10 MG/ML IJ SOLN
INTRAMUSCULAR | Status: AC
  Filled 2013-07-27: qty 1

## 2013-07-27 MED ORDER — KETOROLAC TROMETHAMINE 30 MG/ML IJ SOLN
30.0000 mg | Freq: Four times a day (QID) | INTRAMUSCULAR | Status: DC
  Administered 2013-07-27 – 2013-07-28 (×3): 30 mg via INTRAVENOUS
  Filled 2013-07-27 (×3): qty 1

## 2013-07-27 MED ORDER — ONDANSETRON HCL 4 MG/2ML IJ SOLN
INTRAMUSCULAR | Status: DC | PRN
  Administered 2013-07-27: 4 mg via INTRAVENOUS

## 2013-07-27 MED ORDER — FENTANYL CITRATE 0.05 MG/ML IJ SOLN
INTRAMUSCULAR | Status: AC
  Filled 2013-07-27: qty 5

## 2013-07-27 MED ORDER — ONDANSETRON HCL 4 MG/2ML IJ SOLN: 4.0000 mg | Freq: Four times a day (QID) | INTRAMUSCULAR | Status: DC | PRN

## 2013-07-27 MED ORDER — DIPHENHYDRAMINE HCL 25 MG PO CAPS: 50.0000 mg | ORAL_CAPSULE | Freq: Four times a day (QID) | ORAL | Status: DC | PRN

## 2013-07-27 MED ORDER — ONDANSETRON HCL 4 MG/2ML IJ SOLN
INTRAMUSCULAR | Status: AC
  Filled 2013-07-27: qty 2

## 2013-07-27 MED ORDER — BUPIVACAINE HCL (PF) 0.25 % IJ SOLN
INTRAMUSCULAR | Status: DC | PRN
  Administered 2013-07-27: 5 mL

## 2013-07-27 MED ORDER — LIDOCAINE HCL (CARDIAC) 20 MG/ML IV SOLN
INTRAVENOUS | Status: AC
  Filled 2013-07-27: qty 5

## 2013-07-27 MED ORDER — ROCURONIUM BROMIDE 100 MG/10ML IV SOLN
INTRAVENOUS | Status: DC | PRN
  Administered 2013-07-27: 25 mg via INTRAVENOUS
  Administered 2013-07-27: 5 mg via INTRAVENOUS

## 2013-07-27 MED ORDER — NEOSTIGMINE METHYLSULFATE 1 MG/ML IJ SOLN
INTRAMUSCULAR | Status: AC
  Filled 2013-07-27: qty 1

## 2013-07-27 MED ORDER — LIDOCAINE HCL (CARDIAC) 20 MG/ML IV SOLN
INTRAVENOUS | Status: DC | PRN
  Administered 2013-07-27: 60 mg via INTRAVENOUS

## 2013-07-27 MED ORDER — FENTANYL CITRATE 0.05 MG/ML IJ SOLN
INTRAMUSCULAR | Status: AC
  Filled 2013-07-27: qty 2

## 2013-07-27 MED ORDER — ONDANSETRON HCL 4 MG PO TABS: 4.0000 mg | ORAL_TABLET | Freq: Four times a day (QID) | ORAL | Status: DC | PRN

## 2013-07-27 MED ORDER — MORPHINE SULFATE 4 MG/ML IJ SOLN: 1.0000 mg | INTRAMUSCULAR | Status: DC | PRN

## 2013-07-27 MED ORDER — OXYCODONE-ACETAMINOPHEN 5-325 MG PO TABS: 1.0000 | ORAL_TABLET | ORAL | Status: DC | PRN

## 2013-07-27 SURGICAL SUPPLY — 38 items
CABLE HIGH FREQUENCY MONO STRZ (ELECTRODE) IMPLANT
CLOTH BEACON ORANGE TIMEOUT ST (SAFETY) ×2 IMPLANT
CONT PATH 16OZ SNAP LID 3702 (MISCELLANEOUS) ×2 IMPLANT
COVER MAYO STAND STRL (DRAPES) ×2 IMPLANT
COVER TABLE BACK 60X90 (DRAPES) ×2 IMPLANT
DECANTER SPIKE VIAL GLASS SM (MISCELLANEOUS) ×4 IMPLANT
ELECT REM PT RETURN 9FT ADLT (ELECTROSURGICAL) ×2
ELECTRODE REM PT RTRN 9FT ADLT (ELECTROSURGICAL) ×1 IMPLANT
FILTER SMOKE EVAC LAPAROSHD (FILTER) ×2 IMPLANT
GLOVE BIO SURGEON STRL SZ7.5 (GLOVE) ×6 IMPLANT
GOWN STRL REIN XL XLG (GOWN DISPOSABLE) ×8 IMPLANT
NEEDLE MAYO .5 CIRCLE (NEEDLE) ×2 IMPLANT
NEEDLE SPNL 18GX3.5 QUINCKE PK (NEEDLE) ×2 IMPLANT
NS IRRIG 1000ML POUR BTL (IV SOLUTION) ×2 IMPLANT
PACK LAVH (CUSTOM PROCEDURE TRAY) ×2 IMPLANT
PROTECTOR NERVE ULNAR (MISCELLANEOUS) ×4 IMPLANT
SCALPEL HARMONIC ACE (MISCELLANEOUS) ×2 IMPLANT
SCISSORS LAP 5X35 DISP (ENDOMECHANICALS) IMPLANT
SET CYSTO W/LG BORE CLAMP LF (SET/KITS/TRAYS/PACK) IMPLANT
SET IRRIG TUBING LAPAROSCOPIC (IRRIGATION / IRRIGATOR) ×2 IMPLANT
SOLUTION ELECTROLUBE (MISCELLANEOUS) ×2 IMPLANT
STRIP CLOSURE SKIN 1/4X3 (GAUZE/BANDAGES/DRESSINGS) IMPLANT
SUT PLAIN 4 0 FS 2 27 (SUTURE) ×2 IMPLANT
SUT VIC AB 0 CT1 18XCR BRD8 (SUTURE) ×2 IMPLANT
SUT VIC AB 0 CT1 27 (SUTURE) ×1
SUT VIC AB 0 CT1 27XBRD ANBCTR (SUTURE) ×1 IMPLANT
SUT VIC AB 0 CT1 8-18 (SUTURE) ×2
SUT VIC AB 2-0 SH 27 (SUTURE) ×1
SUT VIC AB 2-0 SH 27XBRD (SUTURE) ×1 IMPLANT
SUT VICRYL 0 TIES 12 18 (SUTURE) ×2 IMPLANT
SUT VICRYL 0 UR6 27IN ABS (SUTURE) ×2 IMPLANT
SYR BULB IRRIGATION 50ML (SYRINGE) ×2 IMPLANT
TOWEL OR 17X24 6PK STRL BLUE (TOWEL DISPOSABLE) ×4 IMPLANT
TRAY FOLEY CATH 14FR (SET/KITS/TRAYS/PACK) ×2 IMPLANT
TROCAR XCEL NON-BLD 11X100MML (ENDOMECHANICALS) ×2 IMPLANT
TROCAR XCEL NON-BLD 5MMX100MML (ENDOMECHANICALS) ×2 IMPLANT
TROCAR XCEL OPT SLVE 5M 100M (ENDOMECHANICALS) ×2 IMPLANT
WATER STERILE IRR 1000ML POUR (IV SOLUTION) IMPLANT

## 2013-07-27 NOTE — Op Note (Signed)
Anna [name] 08/15/1966 [id number]   Post Operative Note   Date of surgery:  07/27/2013  Pre Op Dx:  Leiomyoma, pelvic pain, dyspareunia  Post Op Dx:  Same  Procedure:  LAVH  Surgeon:  Dara Lords  Assistant:  Reynaldo Minium  Anesthesia:  General  EBL:  500 cc per anesthesia  Complications:  None  Specimen:  Morcellated uterus and cervix clinical weight 507 g to pathology  Findings: EUA:  Bulky 14 week size uterus without gross adnexal masses   Operative:  Bulky 14 week size uterus filling the pelvis with multiple leiomyoma, noting a large anterior fundal measuring approximately 8 cm. Right and left fallopian tube segments normal. Right and left ovaries grossly normal freely mobile. No evidence of pelvic endometriosis or adhesive disease. Upper abdominal exam shows appendix grossly normal freely mobile. Liver smooth no abnormalities. Gallbladder grossly normal.  Procedure:  The patient was taken to the operating room, underwent general anesthesia, was placed in the low dorsal lithotomy position, received an abdominal/perineal/vaginal preparation with Betadine solution and an EUA was performed. The time out was performed by the surgical team. The cervix was visualized with a speculum and a Hulka tenaculum was placed without difficulty. A Foley catheter was placed in sterile technique and the patient was subsequently draped in the usual fashion. A transverse infraumbilical incision was made and using the 10 mm Optiview direct entry trocar the abdomen was directly entered under direct visualization without difficulty and subsequently insufflated.  Right and left 5 mm suprapubic ports were then placed under direct visualization after transillumination for the vessels without difficulty.  Examination of the pelvic organs and upper abdominal exam was carried out with findings noted above. The left uterine/ovarian pedicle and fallopian tube were identified and transected using the  harmonic scalpel. The left round ligament was also transected using the harmonic scalpel. Due to the bulk of the uterus no further dissection was performed. A similar procedure was carried out on the other side. At this point attention was turned to the vaginal portion of the procedure and the patient was placed in the high dorsal lithotomy position, the cervix visualized with a weighted speculum, grasped with a tenaculum and the paracervical mucosa was circumferentially injected using 1% lidocaine with 1-100,000 epinephrine mixture using approximately 14 cc. The cervical mucosa was then sharply incised circumferentially and the paracervical planes were sharply developed. The posterior cul-de-sac was then sharply entered and a long weighted speculum was placed. The right and left uterosacral ligaments were identified clamped cut and ligated using 0 Vicryl suture and tagged for future reference. The anterior vesicouterine plane was sharply and bluntly developed and the anterior cul-de-sac was ultimately entered without difficulty and the uterus was then progressively free from its attachments through clamping, cutting and ligating of the paracervical and parametrial tissues using 0 Vicryl suture to the level of the uterine arteries. At this point due to the bulk of the uterus the cervix was excised from the specimen and subsequently through progressive morselization the uterus was removed in stages ultimately identifying the remaining peritoneal attachments and these were clamped cut and ligated using 0 Vicryl suture, completely removing all of the uterus and cervix. The long weighted speculum was replaced with a shorter weighted speculum, the intestines packed from the cul-de-sac using a tagged tail sponge and the pelvis was irrigated showing adequate hemostasis. The posterior vaginal cuff was run from uterosacral ligament to uterosacral ligament using 0 Vicryl suture in a running interlocking stitch. The tail  sponge was removed and the vagina was closed anterior to posterior using 0 Vicryl suture in interrupted figure-of-eight stitch. The vagina was irrigated and again showed adequate hemostasis and attention was then returned to the laparoscopic portion of the procedure. The surgical team regloved, the abdomen was reinsufflated and the pelvis was copiously irrigated with subsequent inspection showing hemostasis at all pedicles and along the vaginal cuff. The gas was slowly allowed to escape and under a low-pressure situation hemostasis was still appreciated and subsequently the 5 mm ports were removed under direct visualization showing adequate hemostasis no evidence of hernia formation. The infraumbilical port was then backed out under direct visualization again showing adequate hemostasis and no evidence of hernia formation. All skin incisions were injected using 0.25% Marcaine and the infraumbilical port was closed using an interrupted 0 Vicryl subcutaneous/fascial stitch. The left 5 mm port was oozing and was closed using 4-0 plain suture in an interrupted stitch and the infraumbilical and right 5 mm suprapubic ports were closed using Dermabond skin adhesive. The patient was placed in the supine position, awakened without difficulty and was taken to the recovery room in good condition having tolerated the procedure well with clear yellow urine in the Foley catheter.   Note: This document was prepared with digital dictation and possible smart phrase technology. Any transcriptional errors that result from this process are unintentional.  Dara Lords MD, 4:54 PM 07/27/2013

## 2013-07-27 NOTE — Anesthesia Preprocedure Evaluation (Signed)
Anesthesia Evaluation  Patient identified by MRN, date of birth, ID band Patient awake    Reviewed: Allergy & Precautions, H&P , Patient's Chart, lab work & pertinent test results, reviewed documented beta blocker date and time   Airway Mallampati: II TM Distance: >3 FB Neck ROM: full    Dental no notable dental hx.    Pulmonary  breath sounds clear to auscultation  Pulmonary exam normal       Cardiovascular Rhythm:regular Rate:Normal     Neuro/Psych    GI/Hepatic GERD-  ,  Endo/Other    Renal/GU      Musculoskeletal   Abdominal   Peds  Hematology   Anesthesia Other Findings   Reproductive/Obstetrics                           Anesthesia Physical Anesthesia Plan  ASA: II  Anesthesia Plan: General   Post-op Pain Management:    Induction: Intravenous  Airway Management Planned: Oral ETT  Additional Equipment:   Intra-op Plan:   Post-operative Plan: Extubation in OR  Informed Consent: I have reviewed the patients History and Physical, chart, labs and discussed the procedure including the risks, benefits and alternatives for the proposed anesthesia with the patient or authorized representative who has indicated his/her understanding and acceptance.   Dental Advisory Given and Dental advisory given  Plan Discussed with: CRNA and Surgeon  Anesthesia Plan Comments: (  Discussed general anesthesia, including possible nausea, instrumentation of airway, sore throat,pulmonary aspiration, etc. I asked if the were any outstanding questions, or  concerns before we proceeded. )        Anesthesia Quick Evaluation

## 2013-07-27 NOTE — H&P (Signed)
  The patient was examined.  I reviewed the proposed surgery and consent form with the patient.  The dictated history and physical is current and accurate and all questions were answered. The patient is ready to proceed with surgery and has a realistic understanding and expectation for the outcome.   Dara Lords MD, 12:31 PM 07/27/2013

## 2013-07-27 NOTE — Anesthesia Procedure Notes (Signed)
Procedure Name: Intubation Date/Time: 07/27/2013 1:19 PM Performed by: Graciela Husbands Pre-anesthesia Checklist: Suction available, Emergency Drugs available, Timeout performed, Patient being monitored and Patient identified Patient Re-evaluated:Patient Re-evaluated prior to inductionOxygen Delivery Method: Circle system utilized Intubation Type: IV induction Ventilation: Mask ventilation without difficulty Tube type: Oral Tube size: 7.0 mm Number of attempts: 1 Airway Equipment and Method: Lighted stylet Placement Confirmation: ETT inserted through vocal cords under direct vision,  positive ETCO2 and breath sounds checked- equal and bilateral Secured at: 21 cm Tube secured with: Tape Dental Injury: Teeth and Oropharynx as per pre-operative assessment

## 2013-07-27 NOTE — Transfer of Care (Signed)
Immediate Anesthesia Transfer of Care Note  Patient: Anna [name]  Procedure(s) Performed: Procedure(s): LAPAROSCOPIC ASSISTED VAGINAL HYSTERECTOMY (N/A)  Patient Location: PACU  Anesthesia Type:General  Level of Consciousness: awake and oriented  Airway & Oxygen Therapy: Patient Spontanous Breathing and Patient connected to nasal cannula oxygen  Post-op Assessment: Report given to PACU RN and Post -op Vital signs reviewed and stable  Post vital signs: Reviewed and stable  Complications: No apparent anesthesia complications

## 2013-07-27 NOTE — Anesthesia Postprocedure Evaluation (Signed)
  Anesthesia Post-op Note  Anesthesia Post Note  Patient: Anna [name]  Procedure(s) Performed: Procedure(s) (LRB): LAPAROSCOPIC ASSISTED VAGINAL HYSTERECTOMY (N/A)  Anesthesia type: General  Patient location: PACU  Post pain: Pain level controlled  Post assessment: Post-op Vital signs reviewed  Last Vitals:  Filed Vitals:   07/27/13 1600  BP: 108/56  Pulse: 61  Temp:   Resp: 14    Post vital signs: Reviewed  Level of consciousness: sedated  Complications: No apparent anesthesia complications

## 2013-07-28 ENCOUNTER — Encounter (HOSPITAL_COMMUNITY): Payer: Self-pay | Admitting: Gynecology

## 2013-07-28 LAB — CBC
MCH: 29.5 pg (ref 26.0–34.0)
MCV: 85.5 fL (ref 78.0–100.0)
Platelets: 279 10*3/uL (ref 150–400)
RDW: 13.3 % (ref 11.5–15.5)

## 2013-07-28 MED ORDER — OXYCODONE-ACETAMINOPHEN 5-325 MG PO TABS: 1.0000 | ORAL_TABLET | ORAL | Status: DC | PRN

## 2013-07-28 NOTE — Anesthesia Postprocedure Evaluation (Signed)
  Anesthesia Post-op Note  Patient: Anna [name]  Procedure(s) Performed: Procedure(s): LAPAROSCOPIC ASSISTED VAGINAL HYSTERECTOMY (N/A)  Patient Location: [facility]  Anesthesia Type:General  Level of Consciousness: awake, alert  and oriented  Airway and Oxygen Therapy: Patient Spontanous Breathing and Patient connected to nasal cannula oxygen  Post-op Pain: mild  Post-op Assessment: Post-op Vital signs reviewed and Patient's Cardiovascular Status Stable  Post-op Vital Signs: Reviewed and stable  Complications: No apparent anesthesia complications

## 2013-07-28 NOTE — Progress Notes (Signed)
Anna [name] [birth year] [id number]   1 Day Post-Op Procedure(s) (LRB): LAPAROSCOPIC ASSISTED VAGINAL HYSTERECTOMY (N/A)  Subjective: Patient reports no complaints, pain severity reported mild, yes taking PO, foley catheter out, no voiding, yes ambulating, no passing flatus  Objective: Vital signs in last 24 hours: Temp:  [97.4 F (36.3 C)-98.3 F (36.8 C)] 97.8 F (36.6 C) (08/06 0559) Pulse Rate:  [59-105] 69 (08/06 0559) Resp:  [14-20] 18 (08/06 0559) BP: (95-119)/(50-73) 95/60 mmHg (08/06 0559) SpO2:  [100 %] 100 % (08/06 0559) Last BM Date: 07/26/13    EXAM General: awake, alert and no distress Resp: rhonchi clear to auscultation bilaterally Cardio: regular rate and rhythm, S1, S2 normal, no murmur, click, rub or gallop GI: soft, non tender, bowel sounds active, incisions dry intact Lower Extremities: Without swelling or tenderness Vaginal Bleeding: Reported scant   Lab Results:   Recent Labs  07/27/13 1145 07/28/13 0525  WBC 7.4 15.4*  HGB 13.3 10.2*  HCT 39.1 29.6*  PLT 355 279    Assessment: s/p Procedure(s): LAPAROSCOPIC ASSISTED VAGINAL HYSTERECTOMY: progressing well, eating, ambulating, good pain relief with po pain meds, ready for discharge after voiding.    Plan: Discharge home today after voiding.  Precautions, instructions and follow up were discussed with the patient.  Prescriptions provided per AVS.  Patient to call the office to arrange a post-operative appointmant in 2 weeks.   Note: This document was prepared with digital dictation and possible smart phrase technology. Any transcriptional errors that result from this process are unintentional.  Dara Lords, MD 07/28/2013 8:02 AM

## 2013-07-29 ENCOUNTER — Telehealth: Payer: Self-pay

## 2013-07-29 NOTE — Telephone Encounter (Signed)
Dulcolax suppository

## 2013-07-29 NOTE — Telephone Encounter (Signed)
Patient informed. 

## 2013-07-29 NOTE — Telephone Encounter (Signed)
LAVH 07/27/13.  Had not passed gas when she left the hospital and still has not passed gas or had BM.  Wants to know what you recommend?

## 2013-08-01 NOTE — Discharge Summary (Signed)
Anna [name] [birth year] [id number]   Discharge Summary  Date of Admission:  07/27/2013  Date of Discharge:  07/28/2013  Discharge Diagnosis:  Pelvic pain, dyspareunia, leiomyomata  Procedure:  Procedure(s): LAPAROSCOPIC ASSISTED VAGINAL HYSTERECTOMY  Pathology:  Uterus and cervix, Morcellated - UTERUS: - ENDOMETRIUM: SECRETORY PHASE ENDOMETRIUM. NO HYPERPLASIA OR MALIGNANCY IDENTIFIED. - MYOMETRIUM: LEIOMYOMATA. NO MALIGNANCY IDENTIFIED. - SEROSA: UNREMARKABLE. - CERVIX: BENIGN SQUAMOUS AND ENDOCERVICAL MUCOSA. NO DYSPLASIA OR MALIGNANCY IDENTIFIED.  Hospital Course:  The patient underwent an uncomplicated LAVH 07/27/2013.  Her postoperative course was uneventful and she was discharged on postoperative day #1 ambulating, eating a regular diet, voiding, with minimal pain relieved with PO pain medication.  Preop Hb 13 with Postop Hb of 10.  The patient received instructions for postoperative care and call precautions.  She received prescriptions per AVS and will be seen in the office 2 weeks following discharge.     Note: This document was prepared with digital dictation and possible smart phrase technology. Any transcriptional errors that result from this process are unintentional.   Dara Lords MD, 4:15 PM 08/01/2013

## 2013-08-09 ENCOUNTER — Encounter: Payer: Self-pay | Admitting: Gynecology

## 2013-08-09 ENCOUNTER — Ambulatory Visit (INDEPENDENT_AMBULATORY_CARE_PROVIDER_SITE_OTHER): Payer: 59 | Admitting: Gynecology

## 2013-08-09 DIAGNOSIS — Z9889 Other specified postprocedural states: Secondary | ICD-10-CM

## 2013-08-09 DIAGNOSIS — R300 Dysuria: Secondary | ICD-10-CM

## 2013-08-09 LAB — URINALYSIS W MICROSCOPIC + REFLEX CULTURE
Glucose, UA: NEGATIVE mg/dL
Ketones, ur: 15 mg/dL — AB
Protein, ur: NEGATIVE mg/dL

## 2013-08-09 NOTE — Patient Instructions (Signed)
Office will contact you to arrange mammogram/ultrasound of the right breast. Use stool softeners to encourage daily soft bowel movements. Slowly resume normal activities with the exception of nothing in the vagina. Followup in 2 weeks for your next postoperative visit.

## 2013-08-09 NOTE — Progress Notes (Signed)
Patient presents for her postoperative visit with several complaints. Notes some constipation requiring suppositories. Does note some pelvic discomfort with bowel movements. No fever chills nausea vomiting or other symptoms. Otherwise doing well voiding regularly eating with minimal pain or discomfort. Ambulating without difficulty. Also notes feeling a nodule in her right breast over the last 2 weeks not noticed previously.  Exam was [employer] Both breast examined lying and sitting with some nodularity the right tail of Spence. No definitive masses consistent with fibrocystic changes. No overlying skin changes nipple discharge or axillary adenopathy. Abdomen soft nontender with incisions healed nicely. Suture still in place at left 5 mm port site. Pelvic external BUS vagina with cuff intact. Bimanual with suggestion of fullness, nontender consistent with small cuff hematoma.  Assessment and plan: 1. Two-week postoperative visit status post LAVH for 450 g leiomyoma. Reviewed benign pathology report with the patient. Symptoms and exam suggest small cuff hematoma. She's otherwise doing well and I do not think further studies needed at this time. I reassured the patient that it should resolve over time. I encouraged her to use stool softener such as Metamucil so that she's having a daily soft stool and to drink fluids to encourage this. I did a urinalysis which did show some WBC and few bacteria. Will await culture and treat accordingly. Otherwise patient will slowly resume activities with the exception of continued pelvic rest and followup in 2 weeks for her next postoperative visit. 2. Nodularity right tail of Spence which I think is probably physiologic. Mammogram 02/2013 normal. We'll start with ultrasound/diagnostic mammography of the right breast for reassurance and the patient agrees with this and knows that we will be arranging this for her.

## 2013-08-10 ENCOUNTER — Telehealth: Payer: Self-pay | Admitting: *Deleted

## 2013-08-10 DIAGNOSIS — N63 Unspecified lump in unspecified breast: Secondary | ICD-10-CM

## 2013-08-10 LAB — URINE CULTURE
Colony Count: NO GROWTH
Organism ID, Bacteria: NO GROWTH

## 2013-08-10 NOTE — Telephone Encounter (Signed)
Per TF note on 08/10/13 pt will need ultrasound/diagnostic mammography of the right breast due to Nodularity right tail of Spence. Orders placed breast center will contact pt.

## 2013-08-11 ENCOUNTER — Ambulatory Visit: Payer: 59 | Admitting: Gynecology

## 2013-08-11 NOTE — Telephone Encounter (Signed)
appt 09/15/13 2 2:00 pm

## 2013-08-24 ENCOUNTER — Encounter: Payer: Self-pay | Admitting: Gynecology

## 2013-08-24 ENCOUNTER — Ambulatory Visit (INDEPENDENT_AMBULATORY_CARE_PROVIDER_SITE_OTHER): Payer: 59 | Admitting: Gynecology

## 2013-08-24 DIAGNOSIS — Z9889 Other specified postprocedural states: Secondary | ICD-10-CM

## 2013-08-24 NOTE — Patient Instructions (Signed)
Abstain from intercourse for another 2 weeks. Slowly resume all normal activities. Followup of any issues otherwise in 6 months for annual exam.

## 2013-08-24 NOTE — Progress Notes (Signed)
Patient presents for 4 week postoperative visit status post LAVH. Questionable cuff hematoma at last visit. She is doing well without symptoms such as pain or discomfort. No vaginal bleeding. Did have intercourse yesterday.  Exam was [facility] Abdomen soft nontender without masses guarding rebound organomegaly. Incision is healed nicely. Pelvic external BUS vagina normal with cuff intact. Bimanual without masses or tenderness.  Assessment and plan: Normal four-week postoperative visit status post LAVH. Patient asked to abstain from anything in the vagina for another 2 weeks to allow complete healing. No evidence of prior suspected cuff hematoma. Slowly resume all normal activities. Followup in 6 months for annual exam, sooner if any issues.

## 2013-08-26 ENCOUNTER — Other Ambulatory Visit: Payer: 59

## 2013-09-10 ENCOUNTER — Ambulatory Visit
Admission: RE | Admit: 2013-09-10 | Discharge: 2013-09-10 | Disposition: A | Discharge status: Home / Self Care | Payer: 59 | Source: Ambulatory Visit | Attending: Gynecology | Admitting: Gynecology

## 2013-09-10 DIAGNOSIS — N63 Unspecified lump in unspecified breast: Secondary | ICD-10-CM

## 2013-09-29 ENCOUNTER — Encounter: Payer: Self-pay | Admitting: Family Medicine

## 2013-09-29 ENCOUNTER — Ambulatory Visit (INDEPENDENT_AMBULATORY_CARE_PROVIDER_SITE_OTHER): Payer: 59 | Admitting: Family Medicine

## 2013-09-29 VITALS — BP 108/68 | HR 78 | Temp 97.6°F | Ht 64.0 in | Wt 131.0 lb

## 2013-09-29 DIAGNOSIS — B354 Tinea corporis: Secondary | ICD-10-CM

## 2013-09-29 MED ORDER — KETOCONAZOLE 2 % EX CREA: TOPICAL_CREAM | Freq: Every day | CUTANEOUS | Status: DC

## 2013-09-29 NOTE — Progress Notes (Signed)
  Subjective:    Patient ID: Anna [name], female    DOB: Aug 09, 1966, 47 y.o.   MRN: [id number]  HPI Has a red spot on her R breast  Happened after a mammogram and [place] Thinks she has a ringworm lesion now It is itchy and a bit red Uses cetophil  Patient Active Problem List   Diagnosis Date Noted  . Routine general medical examination at a health care facility 04/18/2013  . ALLERGIC RHINITIS 03/20/2007  . GERD 03/20/2007  . FIBROCYSTIC BREAST DISEASE 03/20/2007   Past Medical History  Diagnosis Date  . Allergic rhinitis   . GERD (gastroesophageal reflux disease)   . Hiatal hernia     EGD- gastritis, esophagitis 09/2004  . 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 Knee MCL sprain 2007  . History of HPV infection   . Genital warts    Past Surgical History  Procedure Laterality Date  . Laparoscopic assisted vaginal hysterectomy N/A 07/27/2013    Procedure: LAPAROSCOPIC ASSISTED VAGINAL HYSTERECTOMY;  Surgeon: Dara Lords, MD;  Location: [facility];  Service: Gynecology;  Laterality: N/A;   History  Substance Use Topics  . Smoking status: Never Smoker   . Smokeless tobacco: Not on file  . Alcohol Use: No   Family History  Problem Relation Age of Onset  . Aneurysm Father     cerebral  . Breast cancer Maternal Aunt     50's  . Cancer Paternal Aunt     Uterine   No Known Allergies No current outpatient prescriptions on file prior to visit.   No current facility-administered medications on file prior to visit.     Review of Systems Review of Systems  Constitutional: Negative for fever, appetite change, fatigue and unexpected weight change.  Eyes: Negative for pain and visual disturbance.  Respiratory: Negative for cough and shortness of breath.   Cardiovascular: Negative for cp or palpitations    Gastrointestinal: Negative for nausea, diarrhea and constipation.  Genitourinary: Negative for urgency and frequency.  Skin: Negative for pallor    Neurological:  Negative for weakness, light-headedness, numbness and headaches.  Hematological: Negative for adenopathy. Does not bruise/bleed easily.  Psychiatric/Behavioral: Negative for dysphoric mood. The patient is not nervous/anxious.         Objective:   Physical Exam  Constitutional: She appears well-developed and well-nourished. No distress.  HENT:  Head: Normocephalic and atraumatic.  Eyes: Conjunctivae and EOM are normal. Pupils are equal, round, and reactive to light. Right eye exhibits no discharge. Left eye exhibits no discharge. No scleral icterus.  Neck: Normal range of motion. Neck supple.  Cardiovascular: Normal rate and regular rhythm.   Lymphadenopathy:    She has no cervical adenopathy.  Neurological: She is alert.  Skin: Skin is warm and dry. There is erythema.  1-2 cm oval area of erythema with scant central clearing and scale around the outside only  Consistent with ringworm On upper R chest wall  Psychiatric: She has a normal mood and affect.          Assessment & Plan:

## 2013-09-29 NOTE — Patient Instructions (Signed)
Keep area clean and dry  Use the ketoconazole cream once daily Update me if no improvement in 2-4 weeks

## 2013-09-30 NOTE — Assessment & Plan Note (Signed)
tx with ketoconazole cream  inst to keep area clean and dry Update if not starting to improve in a week or if worsening

## 2013-10-18 ENCOUNTER — Telehealth: Payer: Self-pay

## 2013-10-18 DIAGNOSIS — B354 Tinea corporis: Secondary | ICD-10-CM

## 2013-10-18 NOTE — Telephone Encounter (Signed)
Pt left v/m; pt seen 09/29/13 and ketoconazole cream that has been applying for 2 weeks is not helping itching and  ring worm spot has gotten larger. Pt request cb. [facility].

## 2013-10-19 NOTE — Telephone Encounter (Signed)
Left voicemail requesting pt to call office 

## 2013-10-19 NOTE — Telephone Encounter (Signed)
I want to refer her to derm for further eval to see if it is something else  Will do ref now

## 2013-10-27 NOTE — Telephone Encounter (Signed)
appt scheduled for 11/11/13

## 2013-11-04 ENCOUNTER — Telehealth: Payer: Self-pay

## 2013-11-04 NOTE — Telephone Encounter (Signed)
Pt was requesting to schedule PPD for employment; next available appt is not convenient for pt and cannot do on a Thur due to reading TB skin test. Pt said she would call back if needs appt.

## 2013-11-04 NOTE — Telephone Encounter (Signed)
Pt left v/m requesting cb. Left v/m for pt to cb. 

## 2013-11-07 ENCOUNTER — Ambulatory Visit (INDEPENDENT_AMBULATORY_CARE_PROVIDER_SITE_OTHER): Payer: 59 | Admitting: Physician Assistant

## 2013-11-07 VITALS — BP 102/64 | HR 73 | Temp 98.0°F | Resp 16 | Ht 64.0 in | Wt 129.0 lb

## 2013-11-07 DIAGNOSIS — Z111 Encounter for screening for respiratory tuberculosis: Secondary | ICD-10-CM

## 2013-11-07 DIAGNOSIS — Z23 Encounter for immunization: Secondary | ICD-10-CM

## 2013-11-07 NOTE — Progress Notes (Signed)
  Subjective:    Patient ID: Anna [name], female    DOB: October 17, 1966, 47 y.o.   MRN: [id number]  HPI 47 year old female presents for TB screening and flu shot for work.  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  Has had a TB test in the past - several years ago. Never been positive.   Patient is healthy with no other concerns today. Denies chest pain, SOB, hemoptysis, or night sweats    Review of Systems  Constitutional: Negative for fever and chills.  Respiratory: Negative for cough.   Neurological: Negative for headaches.       Objective:   Physical Exam  Constitutional: She is oriented to person, place, and time. She appears well-developed and well-nourished.  HENT:  Head: Normocephalic and atraumatic.  Right Ear: External ear normal.  Left Ear: External ear normal.  Eyes: Conjunctivae are normal.  Neck: Normal range of motion.  Cardiovascular: Normal rate.   Pulmonary/Chest: Effort normal.  Neurological: She is alert and oriented to person, place, and time.  Psychiatric: She has a normal mood and affect. Her behavior is normal. Judgment and thought content normal.          Assessment & Plan:  Screening for tuberculosis - Plan: TB Skin Test  Need for prophylactic vaccination and inoculation against influenza - Plan: Flu Vaccine QUAD 36+ mos IM  TB test placed. Return in 48-72 hours for TB read Flu shot given

## 2013-11-10 ENCOUNTER — Ambulatory Visit (INDEPENDENT_AMBULATORY_CARE_PROVIDER_SITE_OTHER): Payer: 59 | Admitting: Radiology

## 2013-11-10 DIAGNOSIS — Z111 Encounter for screening for respiratory tuberculosis: Secondary | ICD-10-CM

## 2013-11-10 LAB — TB SKIN TEST
Induration: 0 mm
TB Skin Test: NEGATIVE

## 2013-11-16 ENCOUNTER — Telehealth: Payer: Self-pay

## 2013-11-16 NOTE — Telephone Encounter (Signed)
Patient needs copy of tb test for employer. Please call when ready. CB# [phone]

## 2013-11-17 NOTE — Telephone Encounter (Signed)
LMOM for patient to pick up medical records (TB results) at [address] front desk.

## 2014-01-20 ENCOUNTER — Ambulatory Visit (INDEPENDENT_AMBULATORY_CARE_PROVIDER_SITE_OTHER): Payer: 59 | Admitting: Gynecology

## 2014-01-20 ENCOUNTER — Encounter: Payer: Self-pay | Admitting: Gynecology

## 2014-01-20 DIAGNOSIS — N644 Mastodynia: Secondary | ICD-10-CM

## 2014-01-20 NOTE — Progress Notes (Signed)
Patient presents complaining of intermittent bilateral breast pain and burning. Comes and goes throughout the month. No masses or abnormalities palpated. No nipple discharge. Was evaluated for right tail of Spence nodularity in the fall with negative mammography and ultrasound.  Exam was [employer] Both breast examined lying and sitting without masses retractions discharge adenopathy.  Assessment and plan: Physiologic mastalgia. As this comes and goes and does not persist patient will continue with self breast exams and follow. She'll report any persistent discomfort or any perceived abnormalities on SBE. Patient does have an appointment for her annual exam in March and will represent at that time and we'll see how she's doing. Alternatives for treatment to include medications such as danazol discussed but she is not interested in doing this.

## 2014-01-20 NOTE — Patient Instructions (Signed)
Continue with self breast exams. Return if discomfort persists or any palpable abnormalities.

## 2014-03-09 ENCOUNTER — Ambulatory Visit (INDEPENDENT_AMBULATORY_CARE_PROVIDER_SITE_OTHER): Payer: 59 | Admitting: Family Medicine

## 2014-03-09 ENCOUNTER — Telehealth: Payer: Self-pay | Admitting: Family Medicine

## 2014-03-09 ENCOUNTER — Encounter: Payer: Self-pay | Admitting: Family Medicine

## 2014-03-09 VITALS — BP 100/68 | HR 72 | Temp 98.1°F | Wt 132.5 lb

## 2014-03-09 DIAGNOSIS — L739 Follicular disorder, unspecified: Secondary | ICD-10-CM | POA: Insufficient documentation

## 2014-03-09 DIAGNOSIS — L738 Other specified follicular disorders: Secondary | ICD-10-CM

## 2014-03-09 MED ORDER — TRIAMCINOLONE ACETONIDE 0.1 % EX LOTN: 1.0000 "application " | TOPICAL_LOTION | Freq: Three times a day (TID) | CUTANEOUS | Status: DC

## 2014-03-09 MED ORDER — CEPHALEXIN 500 MG PO CAPS: 500.0000 mg | ORAL_CAPSULE | Freq: Four times a day (QID) | ORAL | Status: DC

## 2014-03-09 NOTE — Patient Instructions (Signed)
Start the antibiotics today and use the lotion on the itch areas.  This should gradually get better.   Take care. I wouldn't use the dye again.

## 2014-03-09 NOTE — Telephone Encounter (Signed)
Patient Information:  Caller Name: Anna [name] Anna [name] [name]  Phone: [phone]  Patient: Anna [name], Anna [name] [name]  Gender: Female  DOB: 1966-04-22  Age: 48 Years  PCP: [facility], [facility], [facility])  Pregnant: No  Office Follow Up:  Does the office need to follow up with this patient?: No  Instructions For The Office: N/A   Symptoms  Reason For Call & Symptoms: Patient calling, she had hair dye applied on 3/14 and had an allergic reaction.  She has sores in her scalp and has a rash in front of her left ear.  Same is tender to touch.  She has been taking Benadryl.  She has been washing out the dye per instructions from the hair dresser.  Some of the sores behind her ear are draining.  No fever.  Same was very itchy but has improved.  Reviewed Health History In EMR: Yes  Reviewed Medications In EMR: Yes  Reviewed Allergies In EMR: Yes  Reviewed Surgeries / Procedures: Yes  Date of Onset of Symptoms: 03/05/2014  Treatments Tried: Benadryl q6 hours  Treatments Tried Worked: Yes OB / GYN:  LMP: Unknown  Guideline(s) Used:  Skin Lesion - Moles or Growths  Disposition Per Guideline:   See Within 3 Days in Office  Reason For Disposition Reached:   Patient wants to be seen  Advice Given:  N/A  Patient Will Follow Care Advice:  YES  Appointment Scheduled:  03/09/2014 16:00:00 Appointment Scheduled Provider:  Elsie Stain Brigitte [name]) [facility])

## 2014-03-09 NOTE — Assessment & Plan Note (Signed)
Likely developed after skin irritation.  rewash hair tonight, then use TAC on itchy areas.  Start keflex in meantime, f/u prn.  D/w pt. She agrees.  Wouldn't use that dye again.

## 2014-03-09 NOTE — Progress Notes (Signed)
Pre visit review using our clinic review tool, if applicable. No additional management support is needed unless otherwise documented below in the visit note.  Used a hair dye a few days ago.  It was a new dye, she hadn't used prev.  She had dyed her hair prev, with a milder reaction prev.  Done at home, was left in for about 25 minutes.  Then washed out.  She is going to get it rewashed tonight.  Itching, irritated skin on the scalp.  Less painful now, but scalp is still sore.  Itching is some better.  No fevers.  No tongue swelling or other skin lesion, only the scalp.  Not SOB.  Still swallowing well.    Meds, vitals, and allergies reviewed.   ROS: See HPI.  Otherwise, noncontributory.  nad ncat except for scattered patches of irritated skin with folliculitis on the scalp.  OP w/o tongue or lip edema Neck w/o stridor

## 2014-03-16 ENCOUNTER — Encounter: Payer: Self-pay | Admitting: Gynecology

## 2014-03-16 ENCOUNTER — Ambulatory Visit (INDEPENDENT_AMBULATORY_CARE_PROVIDER_SITE_OTHER): Payer: 59 | Admitting: Gynecology

## 2014-03-16 VITALS — BP 110/70 | Ht 64.0 in | Wt 133.0 lb

## 2014-03-16 DIAGNOSIS — T148XXA Other injury of unspecified body region, initial encounter: Secondary | ICD-10-CM

## 2014-03-16 DIAGNOSIS — Z01419 Encounter for gynecological examination (general) (routine) without abnormal findings: Secondary | ICD-10-CM

## 2014-03-16 LAB — APTT: aPTT: 28 seconds (ref 24–37)

## 2014-03-16 LAB — CBC WITH DIFFERENTIAL/PLATELET
Basophils Absolute: 0 10*3/uL (ref 0.0–0.1)
Basophils Relative: 1 % (ref 0–1)
Eosinophils Absolute: 0.2 10*3/uL (ref 0.0–0.7)
Eosinophils Relative: 5 % (ref 0–5)
HEMATOCRIT: 36.7 % (ref 36.0–46.0)
HEMOGLOBIN: 12.4 g/dL (ref 12.0–15.0)
LYMPHS ABS: 1.5 10*3/uL (ref 0.7–4.0)
LYMPHS PCT: 33 % (ref 12–46)
MCH: 28.4 pg (ref 26.0–34.0)
MCHC: 33.8 g/dL (ref 30.0–36.0)
MCV: 84.2 fL (ref 78.0–100.0)
MONOS PCT: 8 % (ref 3–12)
Monocytes Absolute: 0.4 10*3/uL (ref 0.1–1.0)
NEUTROS ABS: 2.3 10*3/uL (ref 1.7–7.7)
NEUTROS PCT: 53 % (ref 43–77)
Platelets: 359 10*3/uL (ref 150–400)
RBC: 4.36 MIL/uL (ref 3.87–5.11)
RDW: 13.4 % (ref 11.5–15.5)
WBC: 4.4 10*3/uL (ref 4.0–10.5)

## 2014-03-16 LAB — URINALYSIS W MICROSCOPIC + REFLEX CULTURE
BACTERIA UA: NONE SEEN
Bilirubin Urine: NEGATIVE
CASTS: NONE SEEN
CRYSTALS: NONE SEEN
Glucose, UA: NEGATIVE mg/dL
Hgb urine dipstick: NEGATIVE
KETONES UR: NEGATIVE mg/dL
Leukocytes, UA: NEGATIVE
NITRITE: NEGATIVE
PH: 6 (ref 5.0–8.0)
Protein, ur: NEGATIVE mg/dL
SPECIFIC GRAVITY, URINE: 1.02 (ref 1.005–1.030)
Squamous Epithelial / LPF: NONE SEEN
Urobilinogen, UA: 1 mg/dL (ref 0.0–1.0)

## 2014-03-16 LAB — COMPREHENSIVE METABOLIC PANEL
ALBUMIN: 3.9 g/dL (ref 3.5–5.2)
ALK PHOS: 48 U/L (ref 39–117)
ALT: 12 U/L (ref 0–35)
AST: 13 U/L (ref 0–37)
BUN: 12 mg/dL (ref 6–23)
CALCIUM: 8.8 mg/dL (ref 8.4–10.5)
CHLORIDE: 106 meq/L (ref 96–112)
CO2: 25 meq/L (ref 19–32)
Creat: 0.63 mg/dL (ref 0.50–1.10)
GLUCOSE: 89 mg/dL (ref 70–99)
POTASSIUM: 3.7 meq/L (ref 3.5–5.3)
SODIUM: 139 meq/L (ref 135–145)
TOTAL PROTEIN: 6.6 g/dL (ref 6.0–8.3)
Total Bilirubin: 0.5 mg/dL (ref 0.2–1.2)

## 2014-03-16 LAB — LIPID PANEL
CHOLESTEROL: 173 mg/dL (ref 0–200)
HDL: 55 mg/dL (ref >39)
LDL CALC: 106 mg/dL — AB (ref 0–99)
TRIGLYCERIDES: 62 mg/dL (ref <150)
Total CHOL/HDL Ratio: 3.1 Ratio
VLDL: 12 mg/dL (ref 0–40)

## 2014-03-16 LAB — PROTIME-INR
INR: 1.02 (ref <1.50)
Prothrombin Time: 13.3 seconds (ref 11.6–15.2)

## 2014-03-16 NOTE — Patient Instructions (Signed)
Followup in one year for annual exam, sooner if any issues.  You may obtain a copy of any labs that were done today by logging onto MyChart as outlined in the instructions provided with your AVS (after visit summary). The office will not call with normal lab results but certainly if there are any significant abnormalities then we will contact you.   Health Maintenance, Female A healthy lifestyle and preventative care can promote health and wellness.  Maintain regular health, dental, and eye exams.  Eat a healthy diet. Foods like vegetables, fruits, whole grains, low-fat dairy products, and lean protein foods contain the nutrients you need without too many calories. Decrease your intake of foods high in solid fats, added sugars, and salt. Get information about a proper diet from your caregiver, if necessary.  Regular physical exercise is one of the most important things you can do for your health. Most adults should get at least 150 minutes of moderate-intensity exercise (any activity that increases your heart rate and causes you to sweat) each week. In addition, most adults need muscle-strengthening exercises on 2 or more days a week.   Maintain a healthy weight. The body mass index (BMI) is a screening tool to identify possible weight problems. It provides an estimate of body fat based on height and weight. Your caregiver can help determine your BMI, and can help you achieve or maintain a healthy weight. For adults 20 years and older:  A BMI below 18.5 is considered underweight.  A BMI of 18.5 to 24.9 is normal.  A BMI of 25 to 29.9 is considered overweight.  A BMI of 30 and above is considered obese.  Maintain normal blood lipids and cholesterol by exercising and minimizing your intake of saturated fat. Eat a balanced diet with plenty of fruits and vegetables. Blood tests for lipids and cholesterol should begin at age 20 and be repeated every 5 years. If your lipid or cholesterol levels are  high, you are over 50, or you are a high risk for heart disease, you may need your cholesterol levels checked more frequently.Ongoing high lipid and cholesterol levels should be treated with medicines if diet and exercise are not effective.  If you smoke, find out from your caregiver how to quit. If you do not use tobacco, do not start.  Lung cancer screening is recommended for adults aged 55 80 years who are at high risk for developing lung cancer because of a history of smoking. Yearly low-dose computed tomography (CT) is recommended for people who have at least a 30-pack-year history of smoking and are a current smoker or have quit within the past 15 years. A pack year of smoking is smoking an average of 1 pack of cigarettes a day for 1 year (for example: 1 pack a day for 30 years or 2 packs a day for 15 years). Yearly screening should continue until the smoker has stopped smoking for at least 15 years. Yearly screening should also be stopped for people who develop a health problem that would prevent them from having lung cancer treatment.  If you are pregnant, do not drink alcohol. If you are breastfeeding, be very cautious about drinking alcohol. If you are not pregnant and choose to drink alcohol, do not exceed 1 drink per day. One drink is considered to be 12 ounces (355 mL) of beer, 5 ounces (148 mL) of wine, or 1.5 ounces (44 mL) of liquor.  Avoid use of street drugs. Do not share needles with   anyone. Ask for help if you need support or instructions about stopping the use of drugs.  High blood pressure causes heart disease and increases the risk of stroke. Blood pressure should be checked at least every 1 to 2 years. Ongoing high blood pressure should be treated with medicines, if weight loss and exercise are not effective.  If you are 55 to 47 years old, ask your caregiver if you should take aspirin to prevent strokes.  Diabetes screening involves taking a blood sample to check your fasting  blood sugar level. This should be done once every 3 years, after age 45, if you are within normal weight and without risk factors for diabetes. Testing should be considered at a younger age or be carried out more frequently if you are overweight and have at least 1 risk factor for diabetes.  Breast cancer screening is essential preventative care for women. You should practice "breast self-awareness." This means understanding the normal appearance and feel of your breasts and may include breast self-examination. Any changes detected, no matter how small, should be reported to a caregiver. Women in their 20s and 30s should have a clinical breast exam (CBE) by a caregiver as part of a regular health exam every 1 to 3 years. After age 40, women should have a CBE every year. Starting at age 40, women should consider having a mammogram (breast X-ray) every year. Women who have a family history of breast cancer should talk to their caregiver about genetic screening. Women at a high risk of breast cancer should talk to their caregiver about having an MRI and a mammogram every year.  Breast cancer gene (BRCA)-related cancer risk assessment is recommended for women who have family members with BRCA-related cancers. BRCA-related cancers include breast, ovarian, tubal, and peritoneal cancers. Having family members with these cancers may be associated with an increased risk for harmful changes (mutations) in the breast cancer genes BRCA1 and BRCA2. Results of the assessment will determine the need for genetic counseling and BRCA1 and BRCA2 testing.  The Pap test is a screening test for cervical cancer. Women should have a Pap test starting at age 21. Between ages 21 and 29, Pap tests should be repeated every 2 years. Beginning at age 30, you should have a Pap test every 3 years as long as the past 3 Pap tests have been normal. If you had a hysterectomy for a problem that was not cancer or a condition that could lead to  cancer, then you no longer need Pap tests. If you are between ages 65 and 70, and you have had normal Pap tests going back 10 years, you no longer need Pap tests. If you have had past treatment for cervical cancer or a condition that could lead to cancer, you need Pap tests and screening for cancer for at least 20 years after your treatment. If Pap tests have been discontinued, risk factors (such as a new sexual partner) need to be reassessed to determine if screening should be resumed. Some women have medical problems that increase the chance of getting cervical cancer. In these cases, your caregiver may recommend more frequent screening and Pap tests.  The human papillomavirus (HPV) test is an additional test that may be used for cervical cancer screening. The HPV test looks for the virus that can cause the cell changes on the cervix. The cells collected during the Pap test can be tested for HPV. The HPV test could be used to screen women aged 30   years and older, and should be used in women of any age who have unclear Pap test results. After the age of 30, women should have HPV testing at the same frequency as a Pap test.  Colorectal cancer can be detected and often prevented. Most routine colorectal cancer screening begins at the age of 50 and continues through age 75. However, your caregiver may recommend screening at an earlier age if you have risk factors for colon cancer. On a yearly basis, your caregiver may provide home test kits to check for hidden blood in the stool. Use of a small camera at the end of a tube, to directly examine the colon (sigmoidoscopy or colonoscopy), can detect the earliest forms of colorectal cancer. Talk to your caregiver about this at age 50, when routine screening begins. Direct examination of the colon should be repeated every 5 to 10 years through age 75, unless early forms of pre-cancerous polyps or small growths are found.  Hepatitis C blood testing is recommended for  all people born from 1945 through 1965 and any individual with known risks for hepatitis C.  Practice safe sex. Use condoms and avoid high-risk sexual practices to reduce the spread of sexually transmitted infections (STIs). Sexually active women aged 25 and younger should be checked for Chlamydia, which is a common sexually transmitted infection. Older women with new or multiple partners should also be tested for Chlamydia. Testing for other STIs is recommended if you are sexually active and at increased risk.  Osteoporosis is a disease in which the bones lose minerals and strength with aging. This can result in serious bone fractures. The risk of osteoporosis can be identified using a bone density scan. Women ages 65 and over and women at risk for fractures or osteoporosis should discuss screening with their caregivers. Ask your caregiver whether you should be taking a calcium supplement or vitamin D to reduce the rate of osteoporosis.  Menopause can be associated with physical symptoms and risks. Hormone replacement therapy is available to decrease symptoms and risks. You should talk to your caregiver about whether hormone replacement therapy is right for you.  Use sunscreen. Apply sunscreen liberally and repeatedly throughout the day. You should seek shade when your shadow is shorter than you. Protect yourself by wearing long sleeves, pants, a wide-brimmed hat, and sunglasses year round, whenever you are outdoors.  Notify your caregiver of new moles or changes in moles, especially if there is a change in shape or color. Also notify your caregiver if a mole is larger than the size of a pencil eraser.  Stay current with your immunizations. Document Released: 06/24/2011 Document Revised: 04/05/2013 Document Reviewed: 06/24/2011 [employer] Patient Information 2014 [employer].   

## 2014-03-16 NOTE — Progress Notes (Signed)
Anna [name] 1966/05/27 [id number]        48 y.o.  [id number] for annual exam.  Several issues noted below.  Past medical history,surgical history, problem list, medications, allergies, family history and social history were all reviewed and documented in the EPIC chart.  ROS:  Performed and pertinent positives and negatives are included in the history, assessment and plan .  Exam: [name] assistant Filed Vitals:   03/16/14 0925  BP: 110/70  Height: 5\' 4"  (1.626 m)  Weight: 133 lb (60.328 kg)   General appearance  Normal Skin grossly normal Head/Neck normal with no cervical or supraclavicular adenopathy thyroid normal Lungs  clear Cardiac RR, without RMG Abdominal  soft, nontender, without masses, organomegaly or hernia Breasts  examined lying and sitting without masses, retractions, discharge or axillary adenopathy. Pelvic  Ext/BUS/vagina normal.  Adnexa  Without masses or tenderness    Anus and perineum  Normal   Rectovaginal  Normal sphincter tone without palpated masses or tenderness.    Assessment/Plan:  48 y.o. [id number] female for annual exam.   1. 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 Doing well without complaints. 2. Pap smear 2013. Pap smear done today. No history of significant abnormal Pap smears. Options to stop screening altogether she is status post hysterectomy for benign indications versus less frequent screening intervals reviewed. Will readdress on annual basis. 3. Mammography 08/2013. Continue with annual mammography. SBE reviewed. 4. Reports of easy bruisability. Has been going on for years. Apparently was worked up by her primary in the past. Will recheck baseline CBC, PT, PTT 70 normal will monitor. Continues to be a significant issue we'll followup with her primary for evaluation. 5. Health maintenance. Patient requested routine blood work today. Baseline CBC comprehensive metabolic panel lipid profile urinalysis ordered. Followup one year, sooner as  needed.   Note: This document was prepared with digital dictation and possible smart phrase technology. Any transcriptional errors that result from this process are unintentional.   Anastasio Auerbach MD, 9:50 AM 03/16/2014

## 2014-03-17 ENCOUNTER — Other Ambulatory Visit: Payer: Self-pay | Admitting: Gynecology

## 2014-03-17 DIAGNOSIS — R3129 Other microscopic hematuria: Secondary | ICD-10-CM

## 2014-03-18 LAB — URINE CULTURE
COLONY COUNT: NO GROWTH
ORGANISM ID, BACTERIA: NO GROWTH

## 2014-04-29 ENCOUNTER — Other Ambulatory Visit: Payer: 59

## 2014-04-29 DIAGNOSIS — R3129 Other microscopic hematuria: Secondary | ICD-10-CM

## 2014-04-30 LAB — URINALYSIS W MICROSCOPIC + REFLEX CULTURE
Bilirubin Urine: NEGATIVE
CRYSTALS: NONE SEEN
Casts: NONE SEEN
Glucose, UA: NEGATIVE mg/dL
Hgb urine dipstick: NEGATIVE
Ketones, ur: NEGATIVE mg/dL
Leukocytes, UA: NEGATIVE
NITRITE: NEGATIVE
Protein, ur: NEGATIVE mg/dL
SPECIFIC GRAVITY, URINE: 1.007 (ref 1.005–1.030)
SQUAMOUS EPITHELIAL / LPF: NONE SEEN
UROBILINOGEN UA: 1 mg/dL (ref 0.0–1.0)
pH: 6 (ref 5.0–8.0)

## 2014-06-07 ENCOUNTER — Encounter: Payer: Self-pay | Admitting: Gynecology

## 2014-06-07 ENCOUNTER — Ambulatory Visit (INDEPENDENT_AMBULATORY_CARE_PROVIDER_SITE_OTHER): Payer: 59 | Admitting: Gynecology

## 2014-06-07 DIAGNOSIS — N898 Other specified noninflammatory disorders of vagina: Secondary | ICD-10-CM

## 2014-06-07 DIAGNOSIS — B3731 Acute candidiasis of vulva and vagina: Secondary | ICD-10-CM

## 2014-06-07 DIAGNOSIS — L293 Anogenital pruritus, unspecified: Secondary | ICD-10-CM

## 2014-06-07 DIAGNOSIS — B373 Candidiasis of vulva and vagina: Secondary | ICD-10-CM

## 2014-06-07 LAB — WET PREP FOR TRICH, YEAST, CLUE
CLUE CELLS WET PREP: NONE SEEN
Trich, Wet Prep: NONE SEEN

## 2014-06-07 MED ORDER — FLUCONAZOLE 150 MG PO TABS: 150.0000 mg | ORAL_TABLET | Freq: Once | ORAL | Status: DC

## 2014-06-07 NOTE — Patient Instructions (Signed)
Take one Diflucan pill for the yeast infection. Use the other pill as needed if recurrent symptoms.

## 2014-06-07 NOTE — Progress Notes (Signed)
Anna [name] [birth year] [id number]        48 y.o.  [id number] presents with two-day history of vaginal itching. Also with a little sore area that she caused with shaving.  Past medical history,surgical history, problem list, medications, allergies, family history and social history were all reviewed and documented in the EPIC chart.  Directed ROS with pertinent positives and negatives documented in the history of present illness/assessment and plan.  Exam: [name] assistant General appearance  Normal External BUS vagina with small abrasion mid left labia majora. Generalized mild introital erythema. White cakey discharge. Bimanual without masses or tenderness  Assessment/Plan:  48 y.o. [id number] with exam, history and wet prep consistent with yeast vulvovaginitis. Treat with Diflucan 150 mg x1 dose. Extra pill given to have for when necessary use. Small abrasion left labia majora from shaving. Patient will followup if this does not resolve quickly.   Note: This document was prepared with digital dictation and possible smart phrase technology. Any transcriptional errors that result from this process are unintentional.   Anastasio Auerbach MD, 2:30 PM 06/07/2014

## 2014-09-19 ENCOUNTER — Other Ambulatory Visit: Payer: Self-pay | Admitting: Gynecology

## 2014-09-19 DIAGNOSIS — N644 Mastodynia: Secondary | ICD-10-CM

## 2014-09-22 ENCOUNTER — Encounter: Payer: Self-pay | Admitting: Gynecology

## 2014-09-22 ENCOUNTER — Telehealth: Payer: Self-pay | Admitting: *Deleted

## 2014-09-22 ENCOUNTER — Ambulatory Visit (INDEPENDENT_AMBULATORY_CARE_PROVIDER_SITE_OTHER): Payer: 59 | Admitting: Gynecology

## 2014-09-22 DIAGNOSIS — N644 Mastodynia: Secondary | ICD-10-CM

## 2014-09-22 NOTE — Patient Instructions (Signed)
Office will contact you to arrange mammogram and ultrasound. 

## 2014-09-22 NOTE — Progress Notes (Signed)
Anna [name] 19-Jan-1966 [id number]        48 y.o.  [id number] presents complaining of bilateral mastalgia over the past several months. Comes and goes. Seems worst in the left tail of Spence. No palpable abnormalities to self breast exam. No galactorrhea.   Past medical history,surgical history, problem list, medications, allergies, family history and social history were all reviewed and documented in the EPIC chart.  Directed ROS with pertinent positives and negatives documented in the history of present illness/assessment and plan.  Exam: [name] assistant General appearance:  Normal Both breasts examined lying and sitting without masses, retractions, discharge, adenopathy.  Assessment/Plan:  48 y.o. [id number] with bilateral mastalgia left greater than right. Exam is normal. Suspect physiologic. We'll pursue diagnostic mammography for completeness and ultrasound of the left tail of Spence. Assuming negative patient will continue with self breast exams and as long as no palpable abnormalities and these symptoms come and go we will monitor. If she feels anything or a specific area remains tender she'll represent for further evaluation.     Anastasio Auerbach MD, 9:31 AM 09/22/2014

## 2014-09-22 NOTE — Telephone Encounter (Signed)
Message copied by Thamas Jaegers on Thu Sep 22, 2014 10:22 AM ------      Message from: Anastasio Auerbach      Created: Thu Sep 22, 2014  9:32 AM       Schedule bilateral diagnostic mammography and ultrasound left tail of Spence reference bilateral breast tenderness greatest at the left tail of Spence. ------

## 2014-09-22 NOTE — Telephone Encounter (Signed)
Order placed in for breast center they will contact pt to schedule. The bilateral mammogram and left breast u/s was in system already so I put right u/s order in as well.

## 2014-09-28 ENCOUNTER — Ambulatory Visit
Admission: RE | Admit: 2014-09-28 | Discharge: 2014-09-28 | Disposition: A | Discharge status: Home / Self Care | Payer: 59 | Source: Ambulatory Visit | Attending: Gynecology | Admitting: Gynecology

## 2014-09-28 DIAGNOSIS — N644 Mastodynia: Secondary | ICD-10-CM

## 2014-09-28 NOTE — Telephone Encounter (Signed)
Appointment 09/28/14 @ 3:00 pm

## 2014-10-24 ENCOUNTER — Encounter: Payer: Self-pay | Admitting: Gynecology

## 2015-03-22 ENCOUNTER — Ambulatory Visit (INDEPENDENT_AMBULATORY_CARE_PROVIDER_SITE_OTHER): Payer: 59 | Admitting: Gynecology

## 2015-03-22 ENCOUNTER — Other Ambulatory Visit (HOSPITAL_COMMUNITY)
Admission: RE | Admit: 2015-03-22 | Discharge: 2015-03-22 | Disposition: A | Discharge status: Home / Self Care | Payer: 59 | Source: Ambulatory Visit | Attending: Gynecology | Admitting: Gynecology

## 2015-03-22 ENCOUNTER — Encounter: Payer: Self-pay | Admitting: Gynecology

## 2015-03-22 VITALS — BP 114/66 | Ht 64.0 in | Wt 139.0 lb

## 2015-03-22 DIAGNOSIS — Z01419 Encounter for gynecological examination (general) (routine) without abnormal findings: Secondary | ICD-10-CM | POA: Diagnosis not present

## 2015-03-22 DIAGNOSIS — R21 Rash and other nonspecific skin eruption: Secondary | ICD-10-CM

## 2015-03-22 LAB — COMPREHENSIVE METABOLIC PANEL
ALK PHOS: 56 U/L (ref 39–117)
ALT: 11 U/L (ref 0–35)
AST: 13 U/L (ref 0–37)
Albumin: 4 g/dL (ref 3.5–5.2)
BUN: 11 mg/dL (ref 6–23)
CALCIUM: 9.4 mg/dL (ref 8.4–10.5)
CO2: 27 meq/L (ref 19–32)
Chloride: 103 mEq/L (ref 96–112)
Creat: 0.66 mg/dL (ref 0.50–1.10)
GLUCOSE: 76 mg/dL (ref 70–99)
POTASSIUM: 4 meq/L (ref 3.5–5.3)
Sodium: 138 mEq/L (ref 135–145)
TOTAL PROTEIN: 7.3 g/dL (ref 6.0–8.3)
Total Bilirubin: 0.5 mg/dL (ref 0.2–1.2)

## 2015-03-22 LAB — LIPID PANEL
Cholesterol: 198 mg/dL (ref 0–200)
HDL: 55 mg/dL (ref >=46)
LDL Cholesterol: 126 mg/dL — ABNORMAL HIGH (ref 0–99)
Total CHOL/HDL Ratio: 3.6 Ratio
Triglycerides: 84 mg/dL (ref <150)
VLDL: 17 mg/dL (ref 0–40)

## 2015-03-22 LAB — CBC WITH DIFFERENTIAL/PLATELET
BASOS ABS: 0 10*3/uL (ref 0.0–0.1)
BASOS PCT: 0 % (ref 0–1)
Eosinophils Absolute: 0.3 10*3/uL (ref 0.0–0.7)
Eosinophils Relative: 5 % (ref 0–5)
HCT: 41.3 % (ref 36.0–46.0)
Hemoglobin: 13.8 g/dL (ref 12.0–15.0)
LYMPHS ABS: 1.4 10*3/uL (ref 0.7–4.0)
LYMPHS PCT: 27 % (ref 12–46)
MCH: 28.9 pg (ref 26.0–34.0)
MCHC: 33.4 g/dL (ref 30.0–36.0)
MCV: 86.4 fL (ref 78.0–100.0)
MONO ABS: 0.6 10*3/uL (ref 0.1–1.0)
MPV: 8.3 fL — AB (ref 8.6–12.4)
Monocytes Relative: 11 % (ref 3–12)
NEUTROS ABS: 2.9 10*3/uL (ref 1.7–7.7)
NEUTROS PCT: 57 % (ref 43–77)
PLATELETS: 366 10*3/uL (ref 150–400)
RBC: 4.78 MIL/uL (ref 3.87–5.11)
RDW: 13.3 % (ref 11.5–15.5)
WBC: 5 10*3/uL (ref 4.0–10.5)

## 2015-03-22 MED ORDER — BETAMETHASONE DIPROPIONATE 0.05 % EX CREA: TOPICAL_CREAM | Freq: Two times a day (BID) | CUTANEOUS | Status: DC

## 2015-03-22 NOTE — Addendum Note (Signed)
Addended by: Nelva Nay on: 03/22/2015 11:50 AM   Modules accepted: Orders

## 2015-03-22 NOTE — Progress Notes (Signed)
Anna [name] 1966/10/03 [id number]        49 y.o.  [id number] for annual exam.  Doing well.  Past medical history,surgical history, problem list, medications, allergies, family history and social history were all reviewed and documented as reviewed in the EPIC chart.  ROS:  Performed with pertinent positives and negatives included in the history, assessment and plan.   Additional significant findings :   Rash under left arm   Exam: [name] assistant Filed Vitals:   03/22/15 1048  BP: 114/66  Height: 5\' 4"  (1.626 m)  Weight: 139 lb (63.05 kg)   General appearance:  Normal affect, orientation and appearance. Skin: Grossly normal excepting small patch of raised erythematous papule in her upper left arm. HEENT: Without gross lesions.  No cervical or supraclavicular adenopathy. Thyroid normal.  Lungs:  Clear without wheezing, rales or rhonchi Cardiac: RR, without RMG Abdominal:  Soft, nontender, without masses, guarding, rebound, organomegaly or hernia Breasts:  Examined lying and sitting without masses, retractions, discharge or axillary adenopathy. Pelvic:  Ext/BUS/vagina Pap smear of cuff done  Adnexa  Without masses or tenderness    Anus and perineum  Normal   Rectovaginal  Normal sphincter tone without palpated masses or tenderness.    Assessment/Plan:  49 y.o. [id number] female for annual exam.   1. Status post LAVH 2014 for leiomyoma/menorrhagia. Doing well without significant hot flushes, night sweats or other issues. Continue to monitor. 2. Pap smear 2013. Pap smear of vaginal cuff today.  Options to stop screening altogether as she is status post hysterectomy for benign indications no history of significant abnormal Pap smears previously current screening guidelines reviewed.  Will readdress on annual basis. Of note last year's note says Pap smear done but it was not done. I did one this year. 3. Rash upper inner arm. Questionable insect bite. Unusual for contact dermatitis or other  etiology. We'll treat symptomatically with Diprolene 0.05% cream twice a day.  Follow up if persists or worsens for dermatologic referral. 4. Mammography 09/2014. Continue with annual mammography. SBE monthly reviewed. 5. Health maintenance. Baseline CBC comprehensive metabolic panel lipid profile urinalysis ordered per her request. Follow up in one year, sooner as needed.     Anastasio Auerbach MD, 11:33 AM 03/22/2015

## 2015-03-22 NOTE — Patient Instructions (Signed)
Use the steroid cream on your rash twice daily. Call if thoracic continues or worsens for referral to dermatology.  You may obtain a copy of any labs that were done today by logging onto MyChart as outlined in the instructions provided with your AVS (after visit summary). The office will not call with normal lab results but certainly if there are any significant abnormalities then we will contact you.   Health Maintenance, Female A healthy lifestyle and preventative care can promote health and wellness.  Maintain regular health, dental, and eye exams.  Eat a healthy diet. Foods like vegetables, fruits, whole grains, low-fat dairy products, and lean protein foods contain the nutrients you need without too many calories. Decrease your intake of foods high in solid fats, added sugars, and salt. Get information about a proper diet from your caregiver, if necessary.  Regular physical exercise is one of the most important things you can do for your health. Most adults should get at least 150 minutes of moderate-intensity exercise (any activity that increases your heart rate and causes you to sweat) each week. In addition, most adults need muscle-strengthening exercises on 2 or more days a week.   Maintain a healthy weight. The body mass index (BMI) is a screening tool to identify possible weight problems. It provides an estimate of body fat based on height and weight. Your caregiver can help determine your BMI, and can help you achieve or maintain a healthy weight. For adults 20 years and older:  A BMI below 18.5 is considered underweight.  A BMI of 18.5 to 24.9 is normal.  A BMI of 25 to 29.9 is considered overweight.  A BMI of 30 and above is considered obese.  Maintain normal blood lipids and cholesterol by exercising and minimizing your intake of saturated fat. Eat a balanced diet with plenty of fruits and vegetables. Blood tests for lipids and cholesterol should begin at age 33 and be repeated  every 5 years. If your lipid or cholesterol levels are high, you are over 50, or you are a high risk for heart disease, you may need your cholesterol levels checked more frequently.Ongoing high lipid and cholesterol levels should be treated with medicines if diet and exercise are not effective.  If you smoke, find out from your caregiver how to quit. If you do not use tobacco, do not start.  Lung cancer screening is recommended for adults aged 47 80 years who are at high risk for developing lung cancer because of a history of smoking. Yearly low-dose computed tomography (CT) is recommended for people who have at least a 30-pack-year history of smoking and are a current smoker or have quit within the past 15 years. A pack year of smoking is smoking an average of 1 pack of cigarettes a day for 1 year (for example: 1 pack a day for 30 years or 2 packs a day for 15 years). Yearly screening should continue until the smoker has stopped smoking for at least 15 years. Yearly screening should also be stopped for people who develop a health problem that would prevent them from having lung cancer treatment.  If you are pregnant, do not drink alcohol. If you are breastfeeding, be very cautious about drinking alcohol. If you are not pregnant and choose to drink alcohol, do not exceed 1 drink per day. One drink is considered to be 12 ounces (355 mL) of beer, 5 ounces (148 mL) of wine, or 1.5 ounces (44 mL) of liquor.  Avoid use  of street drugs. Do not share needles with anyone. Ask for help if you need support or instructions about stopping the use of drugs.  High blood pressure causes heart disease and increases the risk of stroke. Blood pressure should be checked at least every 1 to 2 years. Ongoing high blood pressure should be treated with medicines, if weight loss and exercise are not effective.  If you are 62 to 48 years old, ask your caregiver if you should take aspirin to prevent strokes.  Diabetes  screening involves taking a blood sample to check your fasting blood sugar level. This should be done once every 3 years, after age 46, if you are within normal weight and without risk factors for diabetes. Testing should be considered at a younger age or be carried out more frequently if you are overweight and have at least 1 risk factor for diabetes.  Breast cancer screening is essential preventative care for women. You should practice "breast self-awareness." This means understanding the normal appearance and feel of your breasts and may include breast self-examination. Any changes detected, no matter how small, should be reported to a caregiver. Women in their 51s and 30s should have a clinical breast exam (CBE) by a caregiver as part of a regular health exam every 1 to 3 years. After age 25, women should have a CBE every year. Starting at age 28, women should consider having a mammogram (breast X-ray) every year. Women who have a family history of breast cancer should talk to their caregiver about genetic screening. Women at a high risk of breast cancer should talk to their caregiver about having an MRI and a mammogram every year.  Breast cancer gene (BRCA)-related cancer risk assessment is recommended for women who have family members with BRCA-related cancers. BRCA-related cancers include breast, ovarian, tubal, and peritoneal cancers. Having family members with these cancers may be associated with an increased risk for harmful changes (mutations) in the breast cancer genes BRCA1 and BRCA2. Results of the assessment will determine the need for genetic counseling and BRCA1 and BRCA2 testing.  The Pap test is a screening test for cervical cancer. Women should have a Pap test starting at age 85. Between ages 59 and 66, Pap tests should be repeated every 2 years. Beginning at age 23, you should have a Pap test every 3 years as long as the past 3 Pap tests have been normal. If you had a hysterectomy for a  problem that was not cancer or a condition that could lead to cancer, then you no longer need Pap tests. If you are between ages 5 and 46, and you have had normal Pap tests going back 10 years, you no longer need Pap tests. If you have had past treatment for cervical cancer or a condition that could lead to cancer, you need Pap tests and screening for cancer for at least 20 years after your treatment. If Pap tests have been discontinued, risk factors (such as a new sexual partner) need to be reassessed to determine if screening should be resumed. Some women have medical problems that increase the chance of getting cervical cancer. In these cases, your caregiver may recommend more frequent screening and Pap tests.  The human papillomavirus (HPV) test is an additional test that may be used for cervical cancer screening. The HPV test looks for the virus that can cause the cell changes on the cervix. The cells collected during the Pap test can be tested for HPV. The HPV test  could be used to screen women aged 4 years and older, and should be used in women of any age who have unclear Pap test results. After the age of 39, women should have HPV testing at the same frequency as a Pap test.  Colorectal cancer can be detected and often prevented. Most routine colorectal cancer screening begins at the age of 27 and continues through age 35. However, your caregiver may recommend screening at an earlier age if you have risk factors for colon cancer. On a yearly basis, your caregiver may provide home test kits to check for hidden blood in the stool. Use of a small camera at the end of a tube, to directly examine the colon (sigmoidoscopy or colonoscopy), can detect the earliest forms of colorectal cancer. Talk to your caregiver about this at age 75, when routine screening begins. Direct examination of the colon should be repeated every 5 to 10 years through age 21, unless early forms of pre-cancerous polyps or small growths  are found.  Hepatitis C blood testing is recommended for all people born from [birth year] through 1964 and any individual with known risks for hepatitis C.  Practice safe sex. Use condoms and avoid high-risk sexual practices to reduce the spread of sexually transmitted infections (STIs). Sexually active women aged 49 and younger should be checked for Chlamydia, which is a common sexually transmitted infection. Older women with new or multiple partners should also be tested for Chlamydia. Testing for other STIs is recommended if you are sexually active and at increased risk.  Osteoporosis is a disease in which the bones lose minerals and strength with aging. This can result in serious bone fractures. The risk of osteoporosis can be identified using a bone density scan. Women ages 62 and over and women at risk for fractures or osteoporosis should discuss screening with their caregivers. Ask your caregiver whether you should be taking a calcium supplement or vitamin D to reduce the rate of osteoporosis.  Menopause can be associated with physical symptoms and risks. Hormone replacement therapy is available to decrease symptoms and risks. You should talk to your caregiver about whether hormone replacement therapy is right for you.  Use sunscreen. Apply sunscreen liberally and repeatedly throughout the day. You should seek shade when your shadow is shorter than you. Protect yourself by wearing long sleeves, pants, a wide-brimmed hat, and sunglasses year round, whenever you are outdoors.  Notify your caregiver of new moles or changes in moles, especially if there is a change in shape or color. Also notify your caregiver if a mole is larger than the size of a pencil eraser.  Stay current with your immunizations. Document Released: 06/24/2011 Document Revised: 04/05/2013 Document Reviewed: 06/24/2011 [facility] Patient Information 2014 [place].

## 2015-03-23 ENCOUNTER — Other Ambulatory Visit: Payer: Self-pay | Admitting: Women's Health

## 2015-03-23 ENCOUNTER — Other Ambulatory Visit: Payer: Self-pay | Admitting: Gynecology

## 2015-03-23 DIAGNOSIS — E7800 Pure hypercholesterolemia, unspecified: Secondary | ICD-10-CM

## 2015-03-23 LAB — URINALYSIS W MICROSCOPIC + REFLEX CULTURE
BILIRUBIN URINE: NEGATIVE
Bacteria, UA: NONE SEEN
CRYSTALS: NONE SEEN
Casts: NONE SEEN
GLUCOSE, UA: NEGATIVE mg/dL
Hgb urine dipstick: NEGATIVE
KETONES UR: NEGATIVE mg/dL
Leukocytes, UA: NEGATIVE
Nitrite: NEGATIVE
PH: 5.5 (ref 5.0–8.0)
Protein, ur: NEGATIVE mg/dL
SPECIFIC GRAVITY, URINE: 1.026 (ref 1.005–1.030)
SQUAMOUS EPITHELIAL / LPF: NONE SEEN
Urobilinogen, UA: 0.2 mg/dL (ref 0.0–1.0)

## 2015-03-24 ENCOUNTER — Other Ambulatory Visit: Payer: 59

## 2015-03-24 DIAGNOSIS — E7800 Pure hypercholesterolemia, unspecified: Secondary | ICD-10-CM

## 2015-03-24 LAB — LIPID PANEL
CHOL/HDL RATIO: 2.9 ratio
Cholesterol: 178 mg/dL (ref 0–200)
HDL: 61 mg/dL (ref >=46)
LDL Cholesterol: 104 mg/dL — ABNORMAL HIGH (ref 0–99)
Triglycerides: 67 mg/dL (ref <150)
VLDL: 13 mg/dL (ref 0–40)

## 2015-03-24 LAB — CYTOLOGY - PAP

## 2015-06-23 ENCOUNTER — Ambulatory Visit: Payer: 59 | Admitting: Family Medicine

## 2015-11-03 ENCOUNTER — Other Ambulatory Visit: Payer: Self-pay

## 2015-11-03 DIAGNOSIS — Z1231 Encounter for screening mammogram for malignant neoplasm of breast: Secondary | ICD-10-CM

## 2015-11-06 ENCOUNTER — Ambulatory Visit (INDEPENDENT_AMBULATORY_CARE_PROVIDER_SITE_OTHER): Payer: 59 | Admitting: Family Medicine

## 2015-11-06 ENCOUNTER — Encounter: Payer: Self-pay | Admitting: Family Medicine

## 2015-11-06 VITALS — BP 126/84 | HR 67 | Temp 97.9°F | Ht 64.0 in | Wt 138.2 lb

## 2015-11-06 DIAGNOSIS — R109 Unspecified abdominal pain: Secondary | ICD-10-CM | POA: Diagnosis not present

## 2015-11-06 DIAGNOSIS — K2970 Gastritis, unspecified, without bleeding: Secondary | ICD-10-CM | POA: Insufficient documentation

## 2015-11-06 LAB — POCT URINALYSIS DIPSTICK
BILIRUBIN UA: NEGATIVE
Blood, UA: NEGATIVE
GLUCOSE UA: NEGATIVE
KETONES UA: NEGATIVE
LEUKOCYTES UA: NEGATIVE
NITRITE UA: NEGATIVE
Protein, UA: NEGATIVE
Spec Grav, UA: 1.02
Urobilinogen, UA: 0.2
pH, UA: 6.5

## 2015-11-06 NOTE — Progress Notes (Signed)
Pre visit review using our clinic review tool, if applicable. No additional management support is needed unless otherwise documented below in the visit note. 

## 2015-11-06 NOTE — Assessment & Plan Note (Signed)
In pt with hx of GERD with mild esophagitis  Suspect irregular eating has played a role  Disc diet Start omeprazole 20 mg daily  F/u 3 mo  Update if not starting to improve in a week or if worsening

## 2015-11-06 NOTE — Patient Instructions (Signed)
Try to eat more regularly Also start omeprazole 20 mg (generic prilosec) one pill daily each am (today take your first dose when you get it) Avoid anti inflammatories (ibuprofen and aleve/naproxen) If no improvement in 2 weeks please let me know   Follow up in 3 months

## 2015-11-06 NOTE — Progress Notes (Signed)
Subjective:    Patient ID: Anna [name], female    DOB: 1966/01/08, 49 y.o.   MRN: [id number]  HPI Here for abdominal discomfort    Abdominal pain - in epigastric area (dull)  Lasted for about 4 weeks  Improved yesterday and today   No meds otc   Was on a strict diet for a while -eating a lot of protein/grilled chicken/tuna/kale/spinach From a trainer  For carbs- brown rice  Prior to that -was very busy and not eating enough in general  irratic lately   Not a lot of tea or coffee Not a lot of nsaids  The day she started -took sinex (? What is in that)-it did bother her stomach   No heartburn in years  No PPI in years Had EGD in the past with mild esophagitis   Not a lot of stress lately   Results for orders placed or performed in visit on 11/06/15  POCT urinalysis dipstick  Result Value Ref Range   Color, UA Yellow    Clarity, UA Clear    Glucose, UA Neg.    Bilirubin, UA Neg.    Ketones, UA Neg.    Spec Grav, UA 1.020    Blood, UA Neg.    pH, UA 6.5    Protein, UA Neg.    Urobilinogen, UA 0.2    Nitrite, UA Neg.    Leukocytes, UA Negative Negative   clear  No urine symptoms   Patient Active Problem List   Diagnosis Date Noted  . Folliculitis Q000111Q  . ALLERGIC RHINITIS 03/20/2007  . GERD 03/20/2007   Past Medical History  Diagnosis Date  . Allergic rhinitis   . GERD (gastroesophageal reflux disease)   . Hiatal hernia     EGD- gastritis, esophagitis 09/2004  . 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 Knee MCL sprain 2007  . History of HPV infection   . Genital warts    Past Surgical History  Procedure Laterality Date  . Laparoscopic assisted vaginal hysterectomy N/A 07/27/2013    Procedure: LAPAROSCOPIC ASSISTED VAGINAL HYSTERECTOMY;  Surgeon: Anastasio Auerbach, MD for leiomyomata   Social History  Substance Use Topics  . Smoking status: Never Smoker   . Smokeless tobacco: Never Used  . Alcohol Use: No   Family History  Problem  Relation Age of Onset  . Aneurysm Father     cerebral  . Breast cancer Maternal Aunt     50's  . Cancer Paternal Aunt     Uterine   No Known Allergies Current Outpatient Prescriptions on File Prior to Visit  Medication Sig Dispense Refill  . Multiple Vitamins-Minerals (HAIR/SKIN/NAILS PO) Take 1 tablet by mouth daily.     No current facility-administered medications on file prior to visit.      Review of Systems    Review of Systems  Constitutional: Negative for fever, appetite change, fatigue and unexpected weight change.  Eyes: Negative for pain and visual disturbance.  Respiratory: Negative for cough and shortness of breath.   Cardiovascular: Negative for cp or palpitations    Gastrointestinal: Negative for nausea, diarrhea and constipation. pos for intermittent epigastric pain , neg for heartburn , neg for dark stool or blood in stool  Genitourinary: Negative for urgency and frequency.  Skin: Negative for pallor or rash   Neurological: Negative for weakness, light-headedness, numbness and headaches.  Hematological: Negative for adenopathy. Does not bruise/bleed easily.  Psychiatric/Behavioral: Negative for dysphoric mood. The patient is  not nervous/anxious.      Objective:   Physical Exam  Constitutional: She appears well-developed and well-nourished. No distress.  Well appearing   HENT:  Head: Normocephalic and atraumatic.  Mouth/Throat: Oropharynx is clear and moist.  Eyes: Conjunctivae and EOM are normal. Pupils are equal, round, and reactive to light. No scleral icterus.  Neck: Normal range of motion. Neck supple.  Cardiovascular: Normal rate, regular rhythm and normal heart sounds.   Pulmonary/Chest: Effort normal and breath sounds normal. No respiratory distress. She has no wheezes. She has no rales.  Abdominal: Soft. Bowel sounds are normal. She exhibits no distension, no abdominal bruit, no pulsatile midline mass and no mass. There is no hepatosplenomegaly.  There is tenderness in the epigastric area. There is no rigidity, no rebound, no guarding, no CVA tenderness, no tenderness at McBurney's point and negative Murphy's sign.  Very mild epigastric tenderness to deep palp only   Genitourinary: Vaginal discharge found.  Lymphadenopathy:    She has no cervical adenopathy.  Neurological: She is alert.  Skin: Skin is warm and dry. No rash noted. No erythema. No pallor.  Psychiatric: She has a normal mood and affect.          Assessment & Plan:   Problem List Items Addressed This Visit      Digestive   Gastritis    In pt with hx of GERD with mild esophagitis  Suspect irregular eating has played a role  Disc diet Start omeprazole 20 mg daily  F/u 3 mo  Update if not starting to improve in a week or if worsening          Other Visit Diagnoses    Abdominal pain, unspecified abdominal location    -  Primary    Relevant Orders    POCT urinalysis dipstick (Completed)

## 2015-11-13 ENCOUNTER — Telehealth: Payer: Self-pay | Admitting: Family Medicine

## 2015-11-13 NOTE — Telephone Encounter (Signed)
[facility] Patient Name: BROOKIE STANFILL DOB: March 17, 1966 Initial Comment Caller says her nose feels like it is swollen all the time, and especially at night it is hard to breath thru nose, and it is cold. She can breath fine thru her mouth Nurse Assessment Nurse: Doyle Askew, RN, Joelene Millin Date/Time [name] Time): 11/13/2015 5:14:54 PM Confirm and document reason for call. If symptomatic, describe symptoms. ---Caller says her nose feels like it is swollen all the time, and especially at night it is hard to breath thru nose, and it is cold. She can breath fine thru her mouth. States if there is fresh grass, it seems much harder for her to breathe. For past few months it is harder for her to breathe in general. No symptoms or fever Has the patient traveled out of the country within the last 30 days? ---Not Applicable Does the patient have any new or worsening symptoms? ---Yes Will a triage be completed? ---Yes Related visit to physician within the last 2 weeks? ---No Does the PT have any chronic conditions? (i.e. diabetes, asthma, etc.) ---No Did the patient indicate they were pregnant? ---No Is this a behavioral health call? ---No Guidelines Guideline Title Affirmed Question Affirmed Notes Common Cold [1] Sinus congestion (pressure, fullness) AND [2] present > 10 days Final Disposition User See PCP When Office is Open (within 3 days) Doyle Askew, RN, [facility] Referrals REFERRED TO PCP OFFICE Disagree/Comply: Leta Baptist

## 2015-11-14 ENCOUNTER — Ambulatory Visit: Payer: 59 | Admitting: Family Medicine

## 2015-11-14 NOTE — Telephone Encounter (Signed)
Will see her then 

## 2015-11-14 NOTE — Telephone Encounter (Signed)
Pt has appt 11/14/15 at 3:30pm with Dr Glori Bickers.

## 2015-11-15 ENCOUNTER — Encounter: Payer: Self-pay | Admitting: Family Medicine

## 2015-11-15 ENCOUNTER — Ambulatory Visit (INDEPENDENT_AMBULATORY_CARE_PROVIDER_SITE_OTHER): Payer: 59 | Admitting: Family Medicine

## 2015-11-15 VITALS — BP 110/68 | HR 61 | Temp 97.6°F | Ht 64.0 in | Wt 137.0 lb

## 2015-11-15 DIAGNOSIS — J302 Other seasonal allergic rhinitis: Secondary | ICD-10-CM | POA: Diagnosis not present

## 2015-11-15 MED ORDER — FLUTICASONE PROPIONATE 50 MCG/ACT NA SUSP: 2.0000 | Freq: Every day | NASAL | Status: DC

## 2015-11-15 NOTE — Progress Notes (Signed)
Pre visit review using our clinic review tool, if applicable. No additional management support is needed unless otherwise documented below in the visit note. 

## 2015-11-15 NOTE — Progress Notes (Signed)
Subjective:    Patient ID: Anna [name], female    DOB: 03/22/1966, 49 y.o.   MRN: [id number]  HPI Here with upper resp symptoms   C/o of "nose is swollen all the time" Stays congested  Worse if it is dry or if she has the heat on   No sneezing  No yellow or green d/c  No cough or wheeze No fever   No new products or perfumes   Has not tried anything otc except benadryl   No sinus pain   Patient Active Problem List   Diagnosis Date Noted  . Gastritis 11/06/2015  . Folliculitis Q000111Q  . ALLERGIC RHINITIS 03/20/2007  . GERD 03/20/2007   Past Medical History  Diagnosis Date  . Allergic rhinitis   . GERD (gastroesophageal reflux disease)   . Hiatal hernia     EGD- gastritis, esophagitis 09/2004  . 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 Knee MCL sprain 2007  . History of HPV infection   . Genital warts    Past Surgical History  Procedure Laterality Date  . Laparoscopic assisted vaginal hysterectomy N/A 07/27/2013    Procedure: LAPAROSCOPIC ASSISTED VAGINAL HYSTERECTOMY;  Surgeon: Anastasio Auerbach, MD for leiomyomata   Social History  Substance Use Topics  . Smoking status: Never Smoker   . Smokeless tobacco: Never Used  . Alcohol Use: No   Family History  Problem Relation Age of Onset  . Aneurysm Father     cerebral  . Breast cancer Maternal Aunt     50's  . Cancer Paternal Aunt     Uterine   No Known Allergies Current Outpatient Prescriptions on File Prior to Visit  Medication Sig Dispense Refill  . Multiple Vitamins-Minerals (HAIR/SKIN/NAILS PO) Take 1 tablet by mouth daily.     No current facility-administered medications on file prior to visit.      Review of Systems Review of Systems  Constitutional: Negative for fever, appetite change, fatigue and unexpected weight change.  Eyes: Negative for pain and visual disturbance.  ENT pos for nasal and sinus congestion / occ sneezing , neg for purulent drainage  Respiratory: Negative for  cough and shortness of breath.   Cardiovascular: Negative for cp or palpitations    Gastrointestinal: Negative for nausea, diarrhea and constipation.  Genitourinary: Negative for urgency and frequency.  Skin: Negative for pallor or rash   Neurological: Negative for weakness, light-headedness, numbness and headaches.  Hematological: Negative for adenopathy. Does not bruise/bleed easily.  Psychiatric/Behavioral: Negative for dysphoric mood. The patient is not nervous/anxious.         Objective:   Physical Exam  Constitutional: She appears well-developed and well-nourished. No distress.  Well appearing   HENT:  Head: Normocephalic and atraumatic.  Right Ear: External ear normal.  Left Ear: External ear normal.  Mouth/Throat: Oropharynx is clear and moist.  Nares are injected and congested  No sinus tenderness Clear rhinorrhea and post nasal drip   Eyes: Conjunctivae and EOM are normal. Pupils are equal, round, and reactive to light. Right eye exhibits no discharge. Left eye exhibits no discharge.  Neck: Normal range of motion. Neck supple.  Cardiovascular: Normal rate and normal heart sounds.   Pulmonary/Chest: Effort normal and breath sounds normal. No respiratory distress. She has no wheezes. She has no rales. She exhibits no tenderness.  Neg for wheezing   Lymphadenopathy:    She has no cervical adenopathy.  Neurological: She is alert.  Skin: Skin is warm  and dry. No rash noted.  Psychiatric: She has a normal mood and affect.          Assessment & Plan:   Problem List Items Addressed This Visit      Respiratory   Allergic rhinitis - Primary    With chronic congestion worse in season  Enc to use flonase ns daily through the season Disc need to avoid allergens / some tips Update if not starting to improve in a week or if worsening  -esp if s/s of bacterial sinusitis

## 2015-11-15 NOTE — Patient Instructions (Signed)
Use flonase nasal spray every day through your allergy seasons  Get the dust taken care of at home (dryer hose)  Change air filters for furnace frequently  Update if not starting to improve in a week or if worsening  - especially if fever or sinus pain

## 2015-11-18 NOTE — Assessment & Plan Note (Signed)
With chronic congestion worse in season  Enc to use flonase ns daily through the season Disc need to avoid allergens / some tips Update if not starting to improve in a week or if worsening  -esp if s/s of bacterial sinusitis

## 2015-11-29 ENCOUNTER — Ambulatory Visit: Payer: 59

## 2015-12-15 ENCOUNTER — Ambulatory Visit
Admission: RE | Admit: 2015-12-15 | Discharge: 2015-12-15 | Disposition: A | Discharge status: Home / Self Care | Payer: 59 | Source: Ambulatory Visit

## 2015-12-15 DIAGNOSIS — Z1231 Encounter for screening mammogram for malignant neoplasm of breast: Secondary | ICD-10-CM

## 2015-12-19 LAB — HM MAMMOGRAPHY: HM Mammogram: NORMAL

## 2015-12-22 ENCOUNTER — Encounter: Payer: Self-pay | Admitting: *Deleted

## 2016-01-02 ENCOUNTER — Encounter: Payer: Self-pay | Admitting: Family Medicine

## 2016-01-02 ENCOUNTER — Ambulatory Visit (INDEPENDENT_AMBULATORY_CARE_PROVIDER_SITE_OTHER)
Admission: RE | Admit: 2016-01-02 | Discharge: 2016-01-02 | Disposition: A | Discharge status: Home / Self Care | Payer: 59 | Source: Ambulatory Visit | Attending: Family Medicine | Admitting: Family Medicine

## 2016-01-02 ENCOUNTER — Ambulatory Visit (INDEPENDENT_AMBULATORY_CARE_PROVIDER_SITE_OTHER): Payer: 59 | Admitting: Family Medicine

## 2016-01-02 VITALS — BP 114/70 | HR 61 | Temp 97.3°F | Ht 64.0 in | Wt 137.8 lb

## 2016-01-02 DIAGNOSIS — M545 Low back pain, unspecified: Secondary | ICD-10-CM | POA: Insufficient documentation

## 2016-01-02 DIAGNOSIS — M5441 Lumbago with sciatica, right side: Secondary | ICD-10-CM

## 2016-01-02 LAB — POC URINALSYSI DIPSTICK (AUTOMATED)
Bilirubin, UA: NEGATIVE
Blood, UA: NEGATIVE
Glucose, UA: NEGATIVE
Ketones, UA: NEGATIVE
LEUKOCYTES UA: NEGATIVE
NITRITE UA: NEGATIVE
PH UA: 6
PROTEIN UA: NEGATIVE
Spec Grav, UA: 1.025
UROBILINOGEN UA: 0.2

## 2016-01-02 MED ORDER — OMEPRAZOLE 20 MG PO CPDR: 20.0000 mg | DELAYED_RELEASE_CAPSULE | Freq: Every day | ORAL | Status: DC

## 2016-01-02 NOTE — Progress Notes (Signed)
Subjective:    Patient ID: Anna [name], female    DOB: 1966-09-10, 50 y.o.   MRN: [id number]  HPI Her with pain in buttocks going down her legs   Of note - she had stopped working out for 3 weeks due to travel   Pain in low back R side- on and off for many years- esp with prolonged standing An ache Last time she saw a doctor - told her to carry kids on the other side   She fell down the steps a month ago - black and blue/buttocks and sore for quite a while   Now most of her pain is in R buttock - and goes 1/2 down her R leg No numbness in leg or foot No weakness or foot drop  No bladder or bowel problem   Getting ready to start back at the gym soon   No otc medicines   No urinary symptoms   Patient Active Problem List   Diagnosis Date Noted  . Gastritis 11/06/2015  . Folliculitis [id number]  . Allergic rhinitis 03/20/2007  . GERD 03/20/2007   Past Medical History  Diagnosis Date  . Allergic rhinitis   . GERD (gastroesophageal reflux disease)   . Hiatal hernia     EGD- gastritis, esophagitis 09/2004  . 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 Knee MCL sprain 2007  . History of HPV infection   . Genital warts    Past Surgical History  Procedure Laterality Date  . Laparoscopic assisted vaginal hysterectomy N/A 07/27/2013    Procedure: LAPAROSCOPIC ASSISTED VAGINAL HYSTERECTOMY;  Surgeon: Anastasio Auerbach, MD for leiomyomata   Social History  Substance Use Topics  . Smoking status: Never Smoker   . Smokeless tobacco: Never Used  . Alcohol Use: No   Family History  Problem Relation Age of Onset  . Aneurysm Father     cerebral  . Breast cancer Maternal Aunt     50's  . Cancer Paternal Aunt     Uterine   No Known Allergies Current Outpatient Prescriptions on File Prior to Visit  Medication Sig Dispense Refill  . fluticasone (FLONASE) 50 MCG/ACT nasal spray Place 2 sprays into both nostrils daily. 16 g 11  . Multiple Vitamins-Minerals  (HAIR/SKIN/NAILS PO) Take 1 tablet by mouth daily.     No current facility-administered medications on file prior to visit.      Review of Systems    Review of Systems  Constitutional: Negative for fever, appetite change, fatigue and unexpected weight change.  Eyes: Negative for pain and visual disturbance.  Respiratory: Negative for cough and shortness of breath.   Cardiovascular: Negative for cp or palpitations    Gastrointestinal: Negative for nausea, diarrhea and constipation.  Genitourinary: Negative for urgency and frequency.  Skin: Negative for pallor or rash   MSK pos for R sided back pain  Neurological: Negative for weakness, light-headedness, numbness and headaches.  Hematological: Negative for adenopathy. Does not bruise/bleed easily.  Psychiatric/Behavioral: Negative for dysphoric mood. The patient is not nervous/anxious.      Objective:   Physical Exam  Constitutional: She appears well-developed and well-nourished. No distress.  Well appearing   HENT:  Head: Normocephalic and atraumatic.  Eyes: Conjunctivae and EOM are normal. Pupils are equal, round, and reactive to light. No scleral icterus.  Neck: Normal range of motion. Neck supple.  Cardiovascular: Normal rate and regular rhythm.   Pulmonary/Chest: Effort normal and breath sounds normal. She has no wheezes.  She has no rales.  Abdominal: Soft. Bowel sounds are normal. She exhibits no distension. There is no tenderness.  Musculoskeletal: She exhibits tenderness.       Lumbar back: She exhibits tenderness and spasm. She exhibits normal range of motion, no bony tenderness and no edema.  Tight upper lumbar musculature on the R  Nl rom of spine and hip Nl gait No neuro changes Neg SLR bilat   Lymphadenopathy:    She has no cervical adenopathy.  Neurological: She is alert. She has normal strength and normal reflexes. She displays no atrophy. No cranial nerve deficit or sensory deficit. She exhibits normal muscle  tone. Coordination and gait normal.  Negative SLR  Skin: Skin is warm and dry. No rash noted. No erythema. No pallor.  Psychiatric: She has a normal mood and affect.          Assessment & Plan:   Problem List Items Addressed This Visit      Other   Lumbar pain - Primary    Intermittent high lumbar pain on the R - (worse with standing)-now with new radicular symptoms on R  No neuro changes Wants to get back to the gym  Back stretch/exercise handout given LS film today  May benefit from PT in the future-suspect strain Neg ua today      Relevant Orders   DG Lumbar Spine Complete (Completed)   POCT Urinalysis Dipstick (Automated) (Completed)

## 2016-01-02 NOTE — Progress Notes (Signed)
Pre visit review using our clinic review tool, if applicable. No additional management support is needed unless otherwise documented below in the visit note. 

## 2016-01-02 NOTE — Patient Instructions (Signed)
Stretch when you can  Walking is good for back pain  Use caution at the gym Take a look at the back exercises to start with - hold each position for 15 seconds and repeat 3 times once daily  Xray of lower back today  Use heat for pain also   Update if not starting to improve in a week or if worsening

## 2016-01-03 NOTE — Assessment & Plan Note (Signed)
Intermittent high lumbar pain on the R - (worse with standing)-now with new radicular symptoms on R  No neuro changes Wants to get back to the gym  Back stretch/exercise handout given LS film today  May benefit from PT in the future-suspect strain Neg ua today

## 2016-03-27 ENCOUNTER — Encounter: Payer: 59 | Admitting: Gynecology

## 2016-03-29 ENCOUNTER — Ambulatory Visit (INDEPENDENT_AMBULATORY_CARE_PROVIDER_SITE_OTHER): Payer: 59 | Admitting: Gynecology

## 2016-03-29 ENCOUNTER — Encounter: Payer: Self-pay | Admitting: Gynecology

## 2016-03-29 VITALS — BP 118/72 | Ht 64.0 in | Wt 139.0 lb

## 2016-03-29 DIAGNOSIS — Z01419 Encounter for gynecological examination (general) (routine) without abnormal findings: Secondary | ICD-10-CM | POA: Diagnosis not present

## 2016-03-29 DIAGNOSIS — N644 Mastodynia: Secondary | ICD-10-CM | POA: Diagnosis not present

## 2016-03-29 DIAGNOSIS — Z13220 Encounter for screening for lipoid disorders: Secondary | ICD-10-CM

## 2016-03-29 LAB — CBC WITH DIFFERENTIAL/PLATELET
BASOS ABS: 0 {cells}/uL (ref 0–200)
Basophils Relative: 0 %
EOS ABS: 204 {cells}/uL (ref 15–500)
Eosinophils Relative: 3 %
HEMATOCRIT: 39.2 % (ref 35.0–45.0)
HEMOGLOBIN: 13 g/dL (ref 11.7–15.5)
LYMPHS ABS: 2312 {cells}/uL (ref 850–3900)
Lymphocytes Relative: 34 %
MCH: 28.8 pg (ref 27.0–33.0)
MCHC: 33.2 g/dL (ref 32.0–36.0)
MCV: 86.9 fL (ref 80.0–100.0)
MONO ABS: 476 {cells}/uL (ref 200–950)
MPV: 8.6 fL (ref 7.5–12.5)
Monocytes Relative: 7 %
NEUTROS PCT: 56 %
Neutro Abs: 3808 cells/uL (ref 1500–7800)
Platelets: 308 10*3/uL (ref 140–400)
RBC: 4.51 MIL/uL (ref 3.80–5.10)
RDW: 13 % (ref 11.0–15.0)
WBC: 6.8 10*3/uL (ref 3.8–10.8)

## 2016-03-29 LAB — COMPREHENSIVE METABOLIC PANEL
ALBUMIN: 3.7 g/dL (ref 3.6–5.1)
ALT: 12 U/L (ref 6–29)
AST: 12 U/L (ref 10–35)
Alkaline Phosphatase: 47 U/L (ref 33–115)
BILIRUBIN TOTAL: 0.6 mg/dL (ref 0.2–1.2)
BUN: 13 mg/dL (ref 7–25)
CALCIUM: 8.8 mg/dL (ref 8.6–10.2)
CO2: 23 mmol/L (ref 20–31)
CREATININE: 0.68 mg/dL (ref 0.50–1.10)
Chloride: 105 mmol/L (ref 98–110)
Glucose, Bld: 78 mg/dL (ref 65–99)
Potassium: 3.6 mmol/L (ref 3.5–5.3)
SODIUM: 138 mmol/L (ref 135–146)
TOTAL PROTEIN: 6.5 g/dL (ref 6.1–8.1)

## 2016-03-29 LAB — LIPID PANEL
CHOLESTEROL: 162 mg/dL (ref 125–200)
HDL: 54 mg/dL (ref >=46)
LDL CALC: 88 mg/dL (ref <130)
TRIGLYCERIDES: 99 mg/dL (ref <150)
Total CHOL/HDL Ratio: 3 Ratio (ref <=5.0)
VLDL: 20 mg/dL (ref <30)

## 2016-03-29 NOTE — Progress Notes (Signed)
    Anna [name] Mar 03, 1966 [id number]        50 y.o.  [id number]  for annual exam.  Doing well. Does note some bilateral breast tenderness as discussed below.  Past medical history,surgical history, problem list, medications, allergies, family history and social history were all reviewed and documented as reviewed in the EPIC chart.  ROS:  Performed with pertinent positives and negatives included in the history, assessment and plan.   Additional significant findings :  none   Exam: Anna [name] assistant Filed Vitals:   03/29/16 1421  BP: 118/72  Height: 5\' 4"  (1.626 m)  Weight: 139 lb (63.05 kg)   General appearance:  Normal affect, orientation and appearance. Skin: Grossly normal HEENT: Without gross lesions.  No cervical or supraclavicular adenopathy. Thyroid normal.  Lungs:  Clear without wheezing, rales or rhonchi Cardiac: RR, without RMG Abdominal:  Soft, nontender, without masses, guarding, rebound, organomegaly or hernia Breasts:  Examined lying and sitting without masses, retractions, discharge or axillary adenopathy. Pelvic:  Ext/BUS/vagina normal  Adnexa without masses or tenderness    Anus and perineum normal   Rectovaginal normal sphincter tone without palpated masses or tenderness.    Assessment/Plan:  50 y.o. [id number] female for annual exam.   1. Status post LAVH for leiomyoma 2014. Doing well without hot flashes, night sweats or other symptoms to suggest menopause. We'll continue to monitor. 2. Bilateral mastalgia. Patient notes over the past several weeks bilateral breast tenderness comes and goes. Similar episodes in the past. No palpable abnormalities to self exam. Most recent mammogram 11/2015. Physician exam is normal. Recommend observation for now as I suspect this is hormonal as it is bilateral and diffuse. If persistent patient will represent for further evaluation. If results and will follow expectantly. SBE monthly reviewed. 3. Pap smear 02/2015. No Pap  smear done today. No history of abnormal Pap smears previously. Options to stop screening altogether per current screening guidelines she is status post hysterectomy versus less frequent screening intervals reviewed. Will readdress on an annual basis. 4. Health maintenance. Baseline CBC, CMP, lipid profile, urinalysis ordered. Follow up 1 year, sooner as needed.   Anna Auerbach MD, 2:58 PM 03/29/2016

## 2016-03-29 NOTE — Patient Instructions (Signed)
Follow up if your breast tenderness continues.  You may obtain a copy of any labs that were done today by logging onto MyChart as outlined in the instructions provided with your AVS (after visit summary). The office will not call with normal lab results but certainly if there are any significant abnormalities then we will contact you.   Health Maintenance Adopting a healthy lifestyle and getting preventive care can go a long way to promote health and wellness. Talk with your health care provider about what schedule of regular examinations is right for you. This is a good chance for you to check in with your provider about disease prevention and staying healthy. In between checkups, there are plenty of things you can do on your own. Experts have done a lot of research about which lifestyle changes and preventive measures are most likely to keep you healthy. Ask your health care provider for more information. WEIGHT AND DIET  Eat a healthy diet  Be sure to include plenty of vegetables, fruits, low-fat dairy products, and lean protein.  Do not eat a lot of foods high in solid fats, added sugars, or salt.  Get regular exercise. This is one of the most important things you can do for your health.  Most adults should exercise for at least 150 minutes each week. The exercise should increase your heart rate and make you sweat (moderate-intensity exercise).  Most adults should also do strengthening exercises at least twice a week. This is in addition to the moderate-intensity exercise.  Maintain a healthy weight  Body mass index (BMI) is a measurement that can be used to identify possible weight problems. It estimates body fat based on height and weight. Your health care provider can help determine your BMI and help you achieve or maintain a healthy weight.  For females 73 years of age and older:   A BMI below 18.5 is considered underweight.  A BMI of 18.5 to 24.9 is normal.  A BMI of 25 to  29.9 is considered overweight.  A BMI of 30 and above is considered obese.  Watch levels of cholesterol and blood lipids  You should start having your blood tested for lipids and cholesterol at 49 years of age, then have this test every 5 years.  You may need to have your cholesterol levels checked more often if:  Your lipid or cholesterol levels are high.  You are older than 49 years of age.  You are at high risk for heart disease.  CANCER SCREENING   Lung Cancer  Lung cancer screening is recommended for adults 6-38 years old who are at high risk for lung cancer because of a history of smoking.  A yearly low-dose CT scan of the lungs is recommended for people who:  Currently smoke.  Have quit within the past 15 years.  Have at least a 30-pack-year history of smoking. A pack year is smoking an average of one pack of cigarettes a day for 1 year.  Yearly screening should continue until it has been 15 years since you quit.  Yearly screening should stop if you develop a health problem that would prevent you from having lung cancer treatment.  Breast Cancer  Practice breast self-awareness. This means understanding how your breasts normally appear and feel.  It also means doing regular breast self-exams. Let your health care provider know about any changes, no matter how small.  If you are in your 20s or 30s, you should have a clinical breast exam (  CBE) by a health care provider every 1-3 years as part of a regular health exam.  If you are 40 or older, have a CBE every year. Also consider having a breast X-ray (mammogram) every year.  If you have a family history of breast cancer, talk to your health care provider about genetic screening.  If you are at high risk for breast cancer, talk to your health care provider about having an MRI and a mammogram every year.  Breast cancer gene (BRCA) assessment is recommended for women who have family members with BRCA-related  cancers. BRCA-related cancers include:  Breast.  Ovarian.  Tubal.  Peritoneal cancers.  Results of the assessment will determine the need for genetic counseling and BRCA1 and BRCA2 testing. Cervical Cancer Routine pelvic examinations to screen for cervical cancer are no longer recommended for nonpregnant women who are considered low risk for cancer of the pelvic organs (ovaries, uterus, and vagina) and who do not have symptoms. A pelvic examination may be necessary if you have symptoms including those associated with pelvic infections. Ask your health care provider if a screening pelvic exam is right for you.   The Pap test is the screening test for cervical cancer for women who are considered at risk.  If you had a hysterectomy for a problem that was not cancer or a condition that could lead to cancer, then you no longer need Pap tests.  If you are older than 65 years, and you have had normal Pap tests for the past 10 years, you no longer need to have Pap tests.  If you have had past treatment for cervical cancer or a condition that could lead to cancer, you need Pap tests and screening for cancer for at least 20 years after your treatment.  If you no longer get a Pap test, assess your risk factors if they change (such as having a new sexual partner). This can affect whether you should start being screened again.  Some women have medical problems that increase their chance of getting cervical cancer. If this is the case for you, your health care provider may recommend more frequent screening and Pap tests.  The human papillomavirus (HPV) test is another test that may be used for cervical cancer screening. The HPV test looks for the virus that can cause cell changes in the cervix. The cells collected during the Pap test can be tested for HPV.  The HPV test can be used to screen women 30 years of age and older. Getting tested for HPV can extend the interval between normal Pap tests from  three to five years.  An HPV test also should be used to screen women of any age who have unclear Pap test results.  After 49 years of age, women should have HPV testing as often as Pap tests.  Colorectal Cancer  This type of cancer can be detected and often prevented.  Routine colorectal cancer screening usually begins at 50 years of age and continues through 49 years of age.  Your health care provider may recommend screening at an earlier age if you have risk factors for colon cancer.  Your health care provider may also recommend using home test kits to check for hidden blood in the stool.  A small camera at the end of a tube can be used to examine your colon directly (sigmoidoscopy or colonoscopy). This is done to check for the earliest forms of colorectal cancer.  Routine screening usually begins at age 50.    Direct examination of the colon should be repeated every 5-10 years through 49 years of age. However, you may need to be screened more often if early forms of precancerous polyps or small growths are found. Skin Cancer  Check your skin from head to toe regularly.  Tell your health care provider about any new moles or changes in moles, especially if there is a change in a mole's shape or color.  Also tell your health care provider if you have a mole that is larger than the size of a pencil eraser.  Always use sunscreen. Apply sunscreen liberally and repeatedly throughout the day.  Protect yourself by wearing long sleeves, pants, a wide-brimmed hat, and sunglasses whenever you are outside. HEART DISEASE, DIABETES, AND HIGH BLOOD PRESSURE   Have your blood pressure checked at least every 1-2 years. High blood pressure causes heart disease and increases the risk of stroke.  If you are between 55 years and 79 years old, ask your health care provider if you should take aspirin to prevent strokes.  Have regular diabetes screenings. This involves taking a blood sample to check  your fasting blood sugar level.  If you are at a normal weight and have a low risk for diabetes, have this test once every three years after 49 years of age.  If you are overweight and have a high risk for diabetes, consider being tested at a younger age or more often. PREVENTING INFECTION  Hepatitis B  If you have a higher risk for hepatitis B, you should be screened for this virus. You are considered at high risk for hepatitis B if:  You were born in a country where hepatitis B is common. Ask your health care provider which countries are considered high risk.  Your parents were born in a high-risk country, and you have not been immunized against hepatitis B (hepatitis B vaccine).  You have HIV or AIDS.  You use needles to inject street drugs.  You live with someone who has hepatitis B.  You have had sex with someone who has hepatitis B.  You get hemodialysis treatment.  You take certain medicines for conditions, including cancer, organ transplantation, and autoimmune conditions. Hepatitis C  Blood testing is recommended for:  Everyone born from 1945 through 1965.  Anyone with known risk factors for hepatitis C. Sexually transmitted infections (STIs)  You should be screened for sexually transmitted infections (STIs) including gonorrhea and chlamydia if:  You are sexually active and are younger than 49 years of age.  You are older than 49 years of age and your health care provider tells you that you are at risk for this type of infection.  Your sexual activity has changed since you were last screened and you are at an increased risk for chlamydia or gonorrhea. Ask your health care provider if you are at risk.  If you do not have HIV, but are at risk, it may be recommended that you take a prescription medicine daily to prevent HIV infection. This is called pre-exposure prophylaxis (PrEP). You are considered at risk if:  You are sexually active and do not regularly use  condoms or know the HIV status of your partner(s).  You take drugs by injection.  You are sexually active with a partner who has HIV. Talk with your health care provider about whether you are at high risk of being infected with HIV. If you choose to begin PrEP, you should first be tested for HIV. You should then be   tested every 3 months for as long as you are taking PrEP.  PREGNANCY   If you are premenopausal and you may become pregnant, ask your health care provider about preconception counseling.  If you may become pregnant, take 400 to 800 micrograms (mcg) of folic acid every day.  If you want to prevent pregnancy, talk to your health care provider about birth control (contraception). OSTEOPOROSIS AND MENOPAUSE   Osteoporosis is a disease in which the bones lose minerals and strength with aging. This can result in serious bone fractures. Your risk for osteoporosis can be identified using a bone density scan.  If you are 23 years of age or older, or if you are at risk for osteoporosis and fractures, ask your health care provider if you should be screened.  Ask your health care provider whether you should take a calcium or vitamin D supplement to lower your risk for osteoporosis.  Menopause may have certain physical symptoms and risks.  Hormone replacement therapy may reduce some of these symptoms and risks. Talk to your health care provider about whether hormone replacement therapy is right for you.  HOME CARE INSTRUCTIONS   Schedule regular health, dental, and eye exams.  Stay current with your immunizations.   Do not use any tobacco products including cigarettes, chewing tobacco, or electronic cigarettes.  If you are pregnant, do not drink alcohol.  If you are breastfeeding, limit how much and how often you drink alcohol.  Limit alcohol intake to no more than 1 drink per day for nonpregnant women. One drink equals 12 ounces of beer, 5 ounces of wine, or 1 ounces of hard  liquor.  Do not use street drugs.  Do not share needles.  Ask your health care provider for help if you need support or information about quitting drugs.  Tell your health care provider if you often feel depressed.  Tell your health care provider if you have ever been abused or do not feel safe at home. Document Released: 06/24/2011 Document Revised: 04/25/2014 Document Reviewed: 11/10/2013 [facility] Patient Information 2015 [place], [place]. This information is not intended to replace advice given to you by your health care provider. Make sure you discuss any questions you have with your health care provider.

## 2016-03-30 LAB — URINALYSIS W MICROSCOPIC + REFLEX CULTURE
BACTERIA UA: NONE SEEN [HPF]
BILIRUBIN URINE: NEGATIVE
CASTS: NONE SEEN [LPF]
CRYSTALS: NONE SEEN [HPF]
Glucose, UA: NEGATIVE
HGB URINE DIPSTICK: NEGATIVE
KETONES UR: NEGATIVE
Leukocytes, UA: NEGATIVE
Nitrite: NEGATIVE
Protein, ur: NEGATIVE
SPECIFIC GRAVITY, URINE: 1.004 (ref 1.001–1.035)
SQUAMOUS EPITHELIAL / LPF: NONE SEEN [HPF] (ref <=5)
WBC UA: NONE SEEN WBC/HPF (ref <=5)
Yeast: NONE SEEN [HPF]
pH: 6 (ref 5.0–8.0)

## 2016-04-01 ENCOUNTER — Other Ambulatory Visit: Payer: Self-pay | Admitting: Gynecology

## 2016-04-01 LAB — URINE CULTURE

## 2016-04-01 MED ORDER — SULFAMETHOXAZOLE-TRIMETHOPRIM 800-160 MG PO TABS: 1.0000 | ORAL_TABLET | Freq: Two times a day (BID) | ORAL | Status: DC

## 2016-04-03 ENCOUNTER — Ambulatory Visit (INDEPENDENT_AMBULATORY_CARE_PROVIDER_SITE_OTHER): Payer: 59 | Admitting: Family Medicine

## 2016-04-03 ENCOUNTER — Encounter: Payer: Self-pay | Admitting: Family Medicine

## 2016-04-03 VITALS — BP 104/60 | HR 82 | Temp 97.9°F | Wt 138.0 lb

## 2016-04-03 DIAGNOSIS — H10023 Other mucopurulent conjunctivitis, bilateral: Secondary | ICD-10-CM | POA: Diagnosis not present

## 2016-04-03 MED ORDER — POLYMYXIN B-TRIMETHOPRIM 10000-0.1 UNIT/ML-% OP SOLN: 1.0000 [drp] | Freq: Four times a day (QID) | OPHTHALMIC | Status: DC

## 2016-04-03 NOTE — Progress Notes (Signed)
SUBJECTIVE:  50 y.o. female with burning, redness, discharge and mattering in both eyes for 2 days.  No other symptoms.  No significant prior ophthalmological history. No change in visual acuity, no photophobia, no severe eye pain.  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  Taking zyrtec OTC which has not helped. Current Outpatient Prescriptions on File Prior to Visit  Medication Sig Dispense Refill  . Multiple Vitamins-Minerals (HAIR/SKIN/NAILS PO) Take 1 tablet by mouth daily. Reported on 03/29/2016    . sulfamethoxazole-trimethoprim (BACTRIM DS,SEPTRA DS) 800-160 MG tablet Take 1 tablet by mouth 2 (two) times daily. 6 tablet 0   No current facility-administered medications on file prior to visit.    No Known Allergies  Past Medical History  Diagnosis Date  . Allergic rhinitis   . GERD (gastroesophageal reflux disease)   . Hiatal hernia     EGD- gastritis, esophagitis 09/2004  . 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 Knee MCL sprain 2007  . History of HPV infection   . Genital warts     Past Surgical History  Procedure Laterality Date  . Laparoscopic assisted vaginal hysterectomy N/A 07/27/2013    Procedure: LAPAROSCOPIC ASSISTED VAGINAL HYSTERECTOMY;  Surgeon: Anastasio Auerbach, MD for leiomyomata    Family History  Problem Relation Age of Onset  . Aneurysm Father     cerebral  . Breast cancer Maternal Aunt     50's  . Cancer Paternal Aunt     Uterine    Social History   Social History  . Marital Status: Widowed    Spouse Name: N/A  . Number of Children: N/A  . Years of Education: N/A   Occupational History  . Not on file.   Social History Main Topics  . Smoking status: Never Smoker   . Smokeless tobacco: Never Used  . Alcohol Use: No  . Drug Use: No  . Sexual Activity: Yes    Birth Control/ Protection: Surgical     Comment: HYST-1st intercourse 26 yo-5 partners   Other Topics Concern  . Not on file   Social History Narrative   Exercises regularly   The  PMH, PSH, Social History, Family History, Medications, and allergies have been reviewed in [facility], and have been updated if relevant.  OBJECTIVE:  BP 104/60 mmHg  Pulse 82  Temp(Src) 97.9 F (36.6 C) (Oral)  Wt 138 lb (62.596 kg)  SpO2 97%  LMP 07/11/2013  Patient appears well, vitals signs are normal. Eyes: both eyes with findings of typical conjunctivitis noted; erythema and discharge. PERRLA, no foreign body noted. No periorbital cellulitis. The corneas are clear and fundi normal. Visual acuity normal.   ASSESSMENT:  Conjunctivitis - probably bacterial  PLAN:  Antibiotic drops per order. Hygiene discussed. If other family members develop same condition, may use same medication for them if they are not known to be allergic to it. Call prn.

## 2016-04-03 NOTE — Progress Notes (Deleted)
   Subjective:   Patient ID: Anna [name], female    DOB: 1966/05/13, 50 y.o.   MRN: [id number]  Anna [name] is a pleasant 50 y.o. year old female pt of Dr. Glori Bickers, new to me, who presents to clinic today with red eyes and Headache  on 04/03/2016  HPI: ***   Current Outpatient Prescriptions on File Prior to Visit  Medication Sig Dispense Refill  . Multiple Vitamins-Minerals (HAIR/SKIN/NAILS PO) Take 1 tablet by mouth daily. Reported on 03/29/2016    . sulfamethoxazole-trimethoprim (BACTRIM DS,SEPTRA DS) 800-160 MG tablet Take 1 tablet by mouth 2 (two) times daily. 6 tablet 0   No current facility-administered medications on file prior to visit.    No Known Allergies  Past Medical History  Diagnosis Date  . Allergic rhinitis   . GERD (gastroesophageal reflux disease)   . Hiatal hernia     EGD- gastritis, esophagitis 09/2004  . 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 Knee MCL sprain 2007  . History of HPV infection   . Genital warts     Past Surgical History  Procedure Laterality Date  . Laparoscopic assisted vaginal hysterectomy N/A 07/27/2013    Procedure: LAPAROSCOPIC ASSISTED VAGINAL HYSTERECTOMY;  Surgeon: Anastasio Auerbach, MD for leiomyomata    Family History  Problem Relation Age of Onset  . Aneurysm Father     cerebral  . Breast cancer Maternal Aunt     50's  . Cancer Paternal Aunt     Uterine    Social History   Social History  . Marital Status: Widowed    Spouse Name: N/A  . Number of Children: N/A  . Years of Education: N/A   Occupational History  . Not on file.   Social History Main Topics  . Smoking status: Never Smoker   . Smokeless tobacco: Never Used  . Alcohol Use: No  . Drug Use: No  . Sexual Activity: Yes    Birth Control/ Protection: Surgical     Comment: HYST-1st intercourse 32 yo-5 partners   Other Topics Concern  . Not on file   Social History Narrative   Exercises regularly   The PMH, PSH, Social History, Family  History, Medications, and allergies have been reviewed in [facility], and have been updated if relevant.  Review of Systems     Objective:    BP 104/60 mmHg  Pulse 82  Temp(Src) 97.9 F (36.6 C) (Oral)  Wt 138 lb (62.596 kg)  SpO2 97%  LMP 07/11/2013   Physical Exam        Assessment & Plan:   No diagnosis found. No Follow-up on file.

## 2016-04-03 NOTE — Progress Notes (Signed)
Pre visit review using our clinic review tool, if applicable. No additional management support is needed unless otherwise documented below in the visit note. 

## 2016-04-03 NOTE — Patient Instructions (Signed)

## 2016-04-29 ENCOUNTER — Telehealth: Payer: Self-pay

## 2016-04-29 NOTE — Telephone Encounter (Signed)
Pt left v/m requesting med for allergies, hives and lip swelling; zyrtec not helping. Left v/m per DPR that pt does need to schedule appt to be seen with hives and lip swelling.

## 2016-05-03 ENCOUNTER — Telehealth: Payer: Self-pay | Admitting: Family Medicine

## 2016-05-03 DIAGNOSIS — J302 Other seasonal allergic rhinitis: Secondary | ICD-10-CM

## 2016-05-03 DIAGNOSIS — R220 Localized swelling, mass and lump, head: Secondary | ICD-10-CM

## 2016-05-03 NOTE — Telephone Encounter (Signed)
Patient Name: MELVENE ESKOLA DOB: 1966-01-09 Initial Comment Caller states she needs to schedule an appt -- Caller is not sure if it is her allergies and her lip swelling Nurse Assessment Nurse: Vallery Sa, RN, Cathy Date/Time (Eastern Time): 05/03/2016 8:19:21 AM Confirm and document reason for call. If symptomatic, describe symptoms. You must click the next button to save text entered. ---Caller states she developed swelling of her lips again this morning. No severe breathing or swallowing difficulty. No wheezing. No fever. Alert and responsive. Has the patient traveled out of the country within the last 30 days? ---No Does the patient have any new or worsening symptoms? ---Yes Will a triage be completed? ---Yes Related visit to physician within the last 2 weeks? ---Yes Does the PT have any chronic conditions? (i.e. diabetes, asthma, etc.) ---Yes List chronic conditions. ---Seasonal Allergies Is the patient pregnant or possibly pregnant? (Ask all females between the ages of 96-55) ---No Is this a behavioral health or substance abuse call? ---No Guidelines Guideline Title Affirmed Question Affirmed Notes Lip Swelling Lip swelling lasts > 3 days Final Disposition User See Physician within [place], RN, Tye [place] Comments Upgraded to See Within 24 Hours due to chronic lip swelling. Unable to schedule an appointment with Dr. Glori Bickers until next Tuesday. caller states she will go to urgent care if needed and will follow up with MD next Tuesday. Referrals GO TO FACILITY OTHER - SPECIFY Disagree/Comply: Comply

## 2016-05-03 NOTE — Telephone Encounter (Signed)
Please offer her a referral to an allergist if needed

## 2016-05-06 NOTE — Telephone Encounter (Signed)
Called and spoken to patient. Patient stated that she does not want a referral to allergist. She already seen one. She will further discuss her lip swelling on office visit on 05/07/2016.

## 2016-05-06 NOTE — Telephone Encounter (Signed)
I will see her then  

## 2016-05-06 NOTE — Telephone Encounter (Signed)
Patient returned Chan's call.  Please call her back at [phone].

## 2016-05-07 ENCOUNTER — Encounter: Payer: Self-pay | Admitting: Family Medicine

## 2016-05-07 ENCOUNTER — Ambulatory Visit (INDEPENDENT_AMBULATORY_CARE_PROVIDER_SITE_OTHER): Payer: 59 | Admitting: Family Medicine

## 2016-05-07 VITALS — BP 118/62 | HR 67 | Temp 97.7°F | Ht 64.0 in | Wt 139.8 lb

## 2016-05-07 DIAGNOSIS — J302 Other seasonal allergic rhinitis: Secondary | ICD-10-CM

## 2016-05-07 DIAGNOSIS — R220 Localized swelling, mass and lump, head: Secondary | ICD-10-CM | POA: Insufficient documentation

## 2016-05-07 DIAGNOSIS — K130 Diseases of lips: Secondary | ICD-10-CM

## 2016-05-07 MED ORDER — RANITIDINE HCL 150 MG PO TABS: 150.0000 mg | ORAL_TABLET | Freq: Two times a day (BID) | ORAL | Status: DC

## 2016-05-07 NOTE — Progress Notes (Signed)
Subjective:    Patient ID: Anna [name], female    DOB: 1966/07/25, 50 y.o.   MRN: [id number]  HPI Allergies have flared up very bad   Last allergy visit was years ago 2010   She takes 2 benadryl at night Zyrtec 10 mg in the am   No nasal sprays  She has used steroid nasal sprays in the past- ? Unsure if it helped   Has never taken singulair  Has had claritin in the past - ? Not as good as zyrtec No allegra   She has hx of lip swelling  When she lived in [place] = had it every day  Never been as bad as then   If she forgets her zyrtec or benadryl her lip will swell Worse with weather changes  occ hives on legs   Triggers incl hot /cold/weather/ pollen  Does not remember what she was allergic to the last time she was tested   Some pnd/ itching in throat -mild  Nose runs at times and she sneezes  Ears itch a little   No asthma or breathing symptoms   When her lip swells -usually just the bottom No swelling in mouth or tongue swelling   Spring is the worst time for her  Better in the fall  Winter and summer are ok  Always worse outside than inside   No food allergies at all   Patient Active Problem List   Diagnosis Date Noted  . Lip swelling 05/07/2016  . Lumbar pain 01/02/2016  . Gastritis 11/06/2015  . Folliculitis [id number]  . Allergic rhinitis 03/20/2007  . GERD 03/20/2007   Past Medical History  Diagnosis Date  . Allergic rhinitis   . GERD (gastroesophageal reflux disease)   . Hiatal hernia     EGD- gastritis, esophagitis 09/2004  . 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 Knee MCL sprain 2007  . History of HPV infection   . Genital warts    Past Surgical History  Procedure Laterality Date  . Laparoscopic assisted vaginal hysterectomy N/A 07/27/2013    Procedure: LAPAROSCOPIC ASSISTED VAGINAL HYSTERECTOMY;  Surgeon: Anastasio Auerbach, MD for leiomyomata   Social History  Substance Use Topics  . Smoking status: Never Smoker   .  Smokeless tobacco: Never Used  . Alcohol Use: No   Family History  Problem Relation Age of Onset  . Aneurysm Father     cerebral  . Breast cancer Maternal Aunt     50's  . Cancer Paternal Aunt     Uterine   No Known Allergies Current Outpatient Prescriptions on File Prior to Visit  Medication Sig Dispense Refill  . Multiple Vitamins-Minerals (HAIR/SKIN/NAILS PO) Take 1 tablet by mouth daily. Reported on 03/29/2016     No current facility-administered medications on file prior to visit.    Review of Systems Review of Systems  Constitutional: Negative for fever, appetite change, fatigue and unexpected weight change.  Eyes: Negative for pain and visual disturbance.  ENt pos for rhinorrhea and sneezing and occ lower lip swelling  Respiratory: Negative for cough and shortness of breath.   Cardiovascular: Negative for cp or palpitations    Gastrointestinal: Negative for nausea, diarrhea and constipation.  Genitourinary: Negative for urgency and frequency.  Skin: Negative for pallor or rash  pos for occ hives on legs  Neurological: Negative for weakness, light-headedness, numbness and headaches.  Hematological: Negative for adenopathy. Does not bruise/bleed easily.  Psychiatric/Behavioral: Negative for dysphoric mood.  The patient is not nervous/anxious.         Objective:   Physical Exam  Constitutional: She appears well-developed and well-nourished. No distress.  HENT:  Head: Normocephalic and atraumatic.  Right Ear: External ear normal.  Left Ear: External ear normal.  Mouth/Throat: Oropharynx is clear and moist. No oropharyngeal exudate.  Nares are boggy Scant clear pnd  Eyes: Conjunctivae and EOM are normal. Pupils are equal, round, and reactive to light. Right eye exhibits no discharge. Left eye exhibits no discharge.  No swelling of face or eyes   Neck: Normal range of motion. Neck supple. No thyromegaly present.  Cardiovascular: Normal rate, regular rhythm and normal  heart sounds.   Pulmonary/Chest: Effort normal and breath sounds normal. No respiratory distress. She has no wheezes. She has no rales.  Musculoskeletal: She exhibits no edema.  Lymphadenopathy:    She has no cervical adenopathy.  Neurological: She is alert. She has normal reflexes.  Skin: Skin is warm and dry. No rash noted. No erythema.  No urticaria  Psychiatric: She has a normal mood and affect.          Assessment & Plan:   Problem List Items Addressed This Visit      Respiratory   Allergic rhinitis - Primary    Pt is controlling with benadryl and zyrtec (in the spring) Rev her allergy notes from 2010  ? What her env allergy is Also occ angioedema in the form of swollen lower lip or hives  Will add H2 blocker -ranitidine 150 bid in season  Can use flonase or nasacort prn for nasal symptoms as well otc Continue allergen avoidance        Other   Lip swelling    Bottom lip only  Thought to be due to all/angioedema (env)  Tends to happen during tree pollen season  Controlled with benadryl in pm and zyrtec 10 mg daily Added H2 blocker- ranitidine 150 bid during the season She denied need for epi pen (no hx of anaphylaxis)  Will continue to follow

## 2016-05-07 NOTE — Patient Instructions (Addendum)
Continue zyrtec every am  (look on line for the [facility] brand-it is cheaper) Continue 1-2 benadryl at night  Add zantac (H2 blocker) 150 mg twice daily during allergy season - I will prescribe that  If you need more control of nasal symptoms- get flonase or nasacort over the counter and use as directed  Try to avoid the pollen you are allergic to if possible  After your season calms down then you may not need all of this   If symptoms worsen or if you develop a swollen throat or difficulty breathing- get to the ED

## 2016-05-07 NOTE — Assessment & Plan Note (Signed)
Bottom lip only  Thought to be due to all/angioedema (env)  Tends to happen during tree pollen season  Controlled with benadryl in pm and zyrtec 10 mg daily Added H2 blocker- ranitidine 150 bid during the season She denied need for epi pen (no hx of anaphylaxis)  Will continue to follow

## 2016-05-07 NOTE — Progress Notes (Signed)
Pre visit review using our clinic review tool, if applicable. No additional management support is needed unless otherwise documented below in the visit note. 

## 2016-05-07 NOTE — Assessment & Plan Note (Signed)
Pt is controlling with benadryl and zyrtec (in the spring) Rev her allergy notes from 2010  ? What her env allergy is Also occ angioedema in the form of swollen lower lip or hives  Will add H2 blocker -ranitidine 150 bid in season  Can use flonase or nasacort prn for nasal symptoms as well otc Continue allergen avoidance

## 2016-05-15 NOTE — Telephone Encounter (Signed)
Done -will route to [place] 

## 2016-05-15 NOTE — Telephone Encounter (Signed)
Patient called and said she would like a referral to an [place] like to [place].  Patient would need an appointment at least a week out, so she can notify work.  Patient can be reached at [phone].

## 2016-06-11 ENCOUNTER — Ambulatory Visit (INDEPENDENT_AMBULATORY_CARE_PROVIDER_SITE_OTHER): Payer: 59 | Admitting: Allergy and Immunology

## 2016-06-11 ENCOUNTER — Encounter: Payer: Self-pay | Admitting: Allergy and Immunology

## 2016-06-11 VITALS — BP 110/60 | HR 64 | Resp 16 | Ht 63.5 in | Wt 140.0 lb

## 2016-06-11 DIAGNOSIS — T783XXA Angioneurotic edema, initial encounter: Secondary | ICD-10-CM | POA: Diagnosis not present

## 2016-06-11 DIAGNOSIS — L500 Allergic urticaria: Secondary | ICD-10-CM | POA: Diagnosis not present

## 2016-06-11 MED ORDER — LEVOCETIRIZINE DIHYDROCHLORIDE 5 MG PO TABS: ORAL_TABLET | ORAL | Status: DC

## 2016-06-11 NOTE — Patient Instructions (Addendum)
Urticaria We were unable to perform skin tests today due to recent administration of antihistamine.   Instructions have been discussed and provided for H1/H2 receptor blockade with titration to find lowest effective dose.  A prescription has been provided for levocetirizine, 5 mg daily as needed. Should significant symptoms recur or new symptoms occur, a journal is to be kept recording any foods eaten, beverages consumed, medications taken, activities performed, and environmental conditions within a 6 hour time period prior to the onset of symptoms. For any symptoms concerning for anaphylaxis, 911 is to be called immediately.   The patient is scheduled to return in the next 1-2 weeks for allergy skin testing after having been off of antihistamines for at least 3 days.    To control symptoms while off of antihistamines, prednisone has been provided: 10g per day for 3 days.  Further recommendations will be made at that time based upon skin test results.  Angioedema Associated angioedema occurs in up to 50% of patients with chronic urticaria.  Treatment/diagnostic plan as outlined above.    Return in about 2 weeks (around 06/25/2016).  Urticaria (Hives)  . Levocetirizine (Xyzal) 5 mg in morning and Cetirizine (Zyrtec) 10mg  at night and ranitidine (Zantac) 150 mg twice a day. If no symptoms for 7-14 days then decrease to. . Levocetirizine (Xyzal) 5 mg in morning and Cetirizine (Zyrtec) 10mg  at night and ranitidine (Zantac) 150 mg once a day.  If no symptoms for 7-14 days then decrease to. . Levocetirizine (Xyzal) 5 mg in morning and Cetirizine (Zyrtec) 10mg  at night.  If no symptoms for 7-14 days then decrease to. . Levocetirizine (Xyzal) 5 mg once a day.  May use Benadryl (diphenhydramine) as needed for breakthrough symptoms       If symptoms return, then step up dosage

## 2016-06-11 NOTE — Assessment & Plan Note (Signed)
Associated angioedema occurs in up to 50% of patients with chronic urticaria.  Treatment/diagnostic plan as outlined above. 

## 2016-06-11 NOTE — Progress Notes (Signed)
New Patient Note  RE: Anna [name] MRN: [id number] DOB: 1966-11-28 Date of Office Visit: 06/11/2016  Referring provider: Abner Greenspan, MD Primary care provider: Loura Pardon, MD  Chief Complaint: Angioedema and Urticaria   History of present illness: HPI Comments: Anna [name] is a 50 y.o. female presenting today for consultation of hives and lip swelling. Over the past 4 weeks, [name] has experienced recurrent episodes of hives. Typical distribution includes the entire body.  The lesions are described as erythematous, raised, and pruritic.  Individual hives last less than 24 hours without leaving residual pigmentation or bruising. She develops occasional concomitant lip swelling, but denies concomitant cardiopulmonary or GI symptoms. She has not experienced unexpected weight loss, recurrent fevers or drenching night sweats. No specific medication or food triggers have been identified.  She is uncertain, but believes that the hives may be exacerbated by extremes of temperature and physical pressure.  However, she develops hives even in the absence of heat, cold, or physical pressure.  The symptoms do not seem to correlate with NSAIDs use or emotional stress. She did not have signs or symptoms of infection at the time of symptom onset. Anna [name] has tried to control symptoms with OTC antihistamines which have offered adequate temporary relief of symptoms.  She attempted to hold antihistamines for 3 days in anticipation of skin testing on today's visit, however due to recurrence of symptoms she restarted antihistamines yesterday.  She had experienced recurrent urticaria over the span of 2 years from 1987-1989.  The hives gradually resolved and have recurred on one or 2 occasions for short periods in the interval since 1989. Reathel experiences rhinorrhea, which may occur year round but more frequently in the springtime and summer.   Assessment and plan: Urticaria We were unable to perform skin  tests today due to recent administration of antihistamine.   Instructions have been discussed and provided for H1/H2 receptor blockade with titration to find lowest effective dose.  A prescription has been provided for levocetirizine, 5 mg daily as needed. Should significant symptoms recur or new symptoms occur, a journal is to be kept recording any foods eaten, beverages consumed, medications taken, activities performed, and environmental conditions within a 6 hour time period prior to the onset of symptoms. For any symptoms concerning for anaphylaxis, 911 is to be called immediately.   The patient is scheduled to return in the next 1-2 weeks for allergy skin testing after having been off of antihistamines for at least 3 days.    To control symptoms while off of antihistamines, prednisone has been provided: 10g per day for 3 days.  Further recommendations will be made at that time based upon skin test results.  Angioedema Associated angioedema occurs in up to 50% of patients with chronic urticaria.  Treatment/diagnostic plan as outlined above.    Meds ordered this encounter  Medications  . levocetirizine (XYZAL) 5 MG tablet    Sig: TAKE ONE TABLET ONCE DAILY IF NEEDED    Dispense:  30 tablet    Refill:  5    Diagnositics: We were unable to perform skin tests today due to recent administration of antihistamine.     Physical examination: Blood pressure 110/60, pulse 64, resp. rate 16, height 5' 3.5" (1.613 m), weight 140 lb (63.504 kg), last menstrual period 07/11/2013.  General: Alert, interactive, in no acute distress. HEENT: TMs pearly gray, turbinates moderately edematous without discharge, post-pharynx mildly erythematous. Neck: Supple without lymphadenopathy. Lungs: Clear to auscultation without wheezing, rhonchi  or rales. CV: Normal S1, S2 without murmurs. Abdomen: Nondistended, nontender. Skin: Warm and dry, without lesions or rashes. Extremities:  No clubbing,  cyanosis or edema. Neuro:   Grossly intact.  Review of systems:  Review of Systems  Constitutional: Negative for fever, chills and weight loss.  HENT: Negative for nosebleeds.   Eyes: Negative for blurred vision.  Respiratory: Negative for cough, hemoptysis, shortness of breath and wheezing.   Cardiovascular: Negative for chest pain.  Gastrointestinal: Negative for diarrhea and constipation.  Genitourinary: Negative for dysuria.  Musculoskeletal: Negative for myalgias and joint pain.  Skin: Positive for itching and rash.  Neurological: Negative for dizziness.  Endo/Heme/Allergies: Positive for environmental allergies. Does not bruise/bleed easily.    Past medical history:  Past Medical History  Diagnosis Date  . Allergic rhinitis   . GERD (gastroesophageal reflux disease)   . Hiatal hernia     EGD- gastritis, esophagitis 09/2004  . 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 Knee MCL sprain 2007  . History of HPV infection   . Genital warts     Past surgical history:  Past Surgical History  Procedure Laterality Date  . Laparoscopic assisted vaginal hysterectomy N/A 07/27/2013    Procedure: LAPAROSCOPIC ASSISTED VAGINAL HYSTERECTOMY;  Surgeon: Anastasio Auerbach, MD for leiomyomata    Family history: Family History  Problem Relation Age of Onset  . Aneurysm Father     cerebral  . Breast cancer Maternal Aunt     50's  . Cancer Paternal Aunt     Uterine    Social history: Social History   Social History  . Marital Status: Widowed    Spouse Name: N/A  . Number of Children: N/A  . Years of Education: N/A   Occupational History  . Not on file.   Social History Main Topics  . Smoking status: Never Smoker   . Smokeless tobacco: Never Used  . Alcohol Use: No  . Drug Use: No  . Sexual Activity: Yes    Birth Control/ Protection: Surgical     Comment: HYST-1st intercourse 35 yo-5 partners   Other Topics Concern  . Not on file   Social History Narrative    Exercises regularly   Environmental History: The patient lives in a 49 year old house with carpeting throughout, gas heat, and central air.  She is a nonsmoker without pets.    Medication List       This list is accurate as of: 06/11/16  6:57 PM.  Always use your most recent med list.               cetirizine 10 MG tablet  Commonly known as:  ZYRTEC  Take 10 mg by mouth daily.     diphenhydrAMINE 50 MG capsule  Commonly known as:  BENADRYL  Take 50 mg by mouth at bedtime.     HAIR/SKIN/NAILS PO  Take 1 tablet by mouth daily. Reported on 03/29/2016     levocetirizine 5 MG tablet  Commonly known as:  XYZAL  TAKE ONE TABLET ONCE DAILY IF NEEDED     ranitidine 150 MG tablet  Commonly known as:  ZANTAC  Take 1 tablet (150 mg total) by mouth 2 (two) times daily.        Known medication allergies: No Known Allergies  I appreciate the opportunity to take part in Barney's care. Please do not hesitate to contact me with questions.  Sincerely,   R. Edgar Frisk, MD

## 2016-06-11 NOTE — Assessment & Plan Note (Addendum)
We were unable to perform skin tests today due to recent administration of antihistamine.   Instructions have been discussed and provided for H1/H2 receptor blockade with titration to find lowest effective dose.  A prescription has been provided for levocetirizine, 5 mg daily as needed. Should significant symptoms recur or new symptoms occur, a journal is to be kept recording any foods eaten, beverages consumed, medications taken, activities performed, and environmental conditions within a 6 hour time period prior to the onset of symptoms. For any symptoms concerning for anaphylaxis, 911 is to be called immediately.   The patient is scheduled to return in the next 1-2 weeks for allergy skin testing after having been off of antihistamines for at least 3 days.    To control symptoms while off of antihistamines, prednisone has been provided: 10g per day for 3 days.  Further recommendations will be made at that time based upon skin test results.

## 2016-06-17 ENCOUNTER — Encounter: Payer: Self-pay | Admitting: Allergy and Immunology

## 2016-06-17 ENCOUNTER — Ambulatory Visit (INDEPENDENT_AMBULATORY_CARE_PROVIDER_SITE_OTHER): Payer: 59 | Admitting: Allergy and Immunology

## 2016-06-17 VITALS — BP 110/70 | HR 76 | Resp 20

## 2016-06-17 DIAGNOSIS — J3089 Other allergic rhinitis: Secondary | ICD-10-CM | POA: Diagnosis not present

## 2016-06-17 DIAGNOSIS — T783XXD Angioneurotic edema, subsequent encounter: Secondary | ICD-10-CM

## 2016-06-17 DIAGNOSIS — L500 Allergic urticaria: Secondary | ICD-10-CM | POA: Diagnosis not present

## 2016-06-17 DIAGNOSIS — T7800XD Anaphylactic reaction due to unspecified food, subsequent encounter: Secondary | ICD-10-CM

## 2016-06-17 MED ORDER — PREDNISONE 1 MG PO TABS: 10.0000 mg | ORAL_TABLET | ORAL | Status: DC

## 2016-06-17 MED ORDER — METHYLPREDNISOLONE ACETATE 80 MG/ML IJ SUSP
80.0000 mg | Freq: Once | INTRAMUSCULAR | Status: AC
  Administered 2016-06-17: 80 mg via INTRAMUSCULAR

## 2016-06-17 NOTE — Patient Instructions (Addendum)
Urticaria Uncertain etiology. 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 NSAIDs and emotional stress commonly exacerbate urticaria but are not the underlying etiology in this case. Physical urticarias are negative by history (i.e. pressure-induced, temperature, vibration, solar, etc.). History and lesions are not consistent with urticaria pigmentosa so I am not suspicious for mastocytosis. There are no concomitant symptoms concerning for anaphylaxis or constitutional symptoms worrisome for an underlying malignancy. We will rule out other potential etiologies with labs. For symptom relief, patient is to take oral antihistamines as directed.  The following labs have been ordered: FCeRI antibody, TSH, anti-thyroglobulin antibody, thyroid peroxidase antibody, tryptase, urea breath test, CBC, CMP, ESR, ANA, and serum specific IgE against food panel and galactose-alpha-1,3-galactose.  The patient will be called with further recommendations after lab results have returned.  Instructions have been discussed and provided for H1/H2 receptor blockade with titration to find lowest effective dose.  A journal is to be kept recording any foods eaten, beverages consumed, medications taken within a 6 hour period prior to the onset of symptoms, as well as record activities being performed, and environmental conditions. For any symptoms concerning for anaphylaxis, 911 is to be called immediately.  Angioedema Associated angioedema occurs in up to 50% of patients with chronic urticaria.  Treatment/diagnostic plan as outlined above.  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  A lab order form has been provided for serum specific IgE against aeroallergen panel.  Continue levocetirizine, 5 mg daily as needed.  A prescription has been provided for fluticasone nasal spray, 2 sprays per nostril daily as needed. Proper nasal spray technique has  been discussed and demonstrated.  I have also recommended nasal saline spray (i.e., Simply Saline) or nasal saline lavage (i.e., NeilMed) as needed prior to medicated nasal sprays.   When lab results have returned the patient will be called with further recommendations and follow up instructions.

## 2016-06-17 NOTE — Assessment & Plan Note (Signed)
Uncertain etiology. 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 NSAIDs and emotional stress commonly exacerbate urticaria but are not the underlying etiology in this case. Physical urticarias are negative by history (i.e. pressure-induced, temperature, vibration, solar, etc.). History and lesions are not consistent with urticaria pigmentosa so I am not suspicious for mastocytosis. There are no concomitant symptoms concerning for anaphylaxis or constitutional symptoms worrisome for an underlying malignancy. We will rule out other potential etiologies with labs. For symptom relief, patient is to take oral antihistamines as directed.  The following labs have been ordered: FCeRI antibody, TSH, anti-thyroglobulin antibody, thyroid peroxidase antibody, tryptase, urea breath test, CBC, CMP, ESR, ANA, and serum specific IgE against food panel and galactose-alpha-1,3-galactose.  The patient will be called with further recommendations after lab results have returned.  Instructions have been discussed and provided for H1/H2 receptor blockade with titration to find lowest effective dose.  A journal is to be kept recording any foods eaten, beverages consumed, medications taken within a 6 hour period prior to the onset of symptoms, as well as record activities being performed, and environmental conditions. For any symptoms concerning for anaphylaxis, 911 is to be called immediately. 

## 2016-06-17 NOTE — Progress Notes (Signed)
Follow-up Note  RE: [name] MRN: [id number] DOB: Nov 07, 1966 Date of Office Visit: 06/17/2016  Primary care provider: Loura Pardon, MD Referring provider: Glori Bickers, Wynelle Fanny, MD  History of present illness: HPI Comments: Anna [name] is a 50 y.o. female with recurrent urticaria and angioedema who presents today for allergy skin testing.  She was unable to undergo skin testing during her initial evaluation due to recent administration of antihistamine.  She has been off of all antihistamines over the past 3 days in anticipation of today's testing.  She reports that over the past 3 days while off of antihistamines she has experienced recurrence of urticaria as well as some swelling of her upper lip.  She has no nasal symptom complaints today.  Assessment and plan: Urticaria Uncertain etiology. 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 NSAIDs and emotional stress commonly exacerbate urticaria but are not the underlying etiology in this case. Physical urticarias are negative by history (i.e. pressure-induced, temperature, vibration, solar, etc.). History and lesions are not consistent with urticaria pigmentosa so I am not suspicious for mastocytosis. There are no concomitant symptoms concerning for anaphylaxis or constitutional symptoms worrisome for an underlying malignancy. We will rule out other potential etiologies with labs. For symptom relief, patient is to take oral antihistamines as directed.  The following labs have been ordered: FCeRI antibody, TSH, anti-thyroglobulin antibody, thyroid peroxidase antibody, tryptase, urea breath test, CBC, CMP, ESR, ANA, and serum specific IgE against food panel and galactose-alpha-1,3-galactose.  The patient will be called with further recommendations after lab results have returned.  Instructions have been discussed and provided for H1/H2 receptor blockade with titration to find lowest effective dose.  A journal  is to be kept recording any foods eaten, beverages consumed, medications taken within a 6 hour period prior to the onset of symptoms, as well as record activities being performed, and environmental conditions. For any symptoms concerning for anaphylaxis, 911 is to be called immediately.  Angioedema Associated angioedema occurs in up to 50% of patients with chronic urticaria.  Treatment/diagnostic plan as outlined above.  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  A lab order form has been provided for serum specific IgE against aeroallergen panel.  Continue levocetirizine, 5 mg daily as needed.  A prescription has been provided for fluticasone nasal spray, 2 sprays per nostril daily as needed. Proper nasal spray technique has been discussed and demonstrated.  I have also recommended nasal saline spray (i.e., Simply Saline) or nasal saline lavage (i.e., NeilMed) as needed prior to medicated nasal sprays.    Meds ordered this encounter  Medications  . methylPREDNISolone acetate (DEPO-MEDROL) injection 80 mg    Sig:     Diagnositics: Food allergen and environmental allergen skin tests were uninterpretable today due to dermatographia.    Physical examination: Blood pressure 110/70, pulse 76, resp. rate 20, last menstrual period 07/11/2013.  General: Alert, interactive, in no acute distress. HEENT: TMs pearly gray, turbinates edematous with clear discharge, post-pharynx mildly erythematous. Mild edema of the upper lip. Neck: Supple without lymphadenopathy. Lungs: Clear to auscultation without wheezing, rhonchi or rales. CV: Normal S1, S2 without murmurs. Skin: Scattered erythematous urticarial type lesions primarily located arms and shoulders , nonvesicular.  The following portions of the patient's history were reviewed and updated as appropriate: allergies, current medications, past family history, past medical history, past social history,  past surgical history and problem list.    Medication List  This list is accurate as of: 06/17/16  7:28 PM.  Always use your most recent med list.               cetirizine 10 MG tablet  Commonly known as:  ZYRTEC  Take 10 mg by mouth daily.     diphenhydrAMINE 50 MG capsule  Commonly known as:  BENADRYL  Take 50 mg by mouth at bedtime.     HAIR/SKIN/NAILS PO  Take 1 tablet by mouth daily. Reported on 03/29/2016     levocetirizine 5 MG tablet  Commonly known as:  XYZAL  TAKE ONE TABLET ONCE DAILY IF NEEDED     ranitidine 150 MG tablet  Commonly known as:  ZANTAC  Take 1 tablet (150 mg total) by mouth 2 (two) times daily.        No Known Allergies  I appreciate the opportunity to take part in Anna [name]'s care. Please do not hesitate to contact me with questions.  Sincerely,   R. Edgar Frisk, MD

## 2016-06-17 NOTE — Assessment & Plan Note (Signed)
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  A lab order form has been provided for serum specific IgE against aeroallergen panel.  Continue levocetirizine, 5 mg daily as needed.  A prescription has been provided for fluticasone nasal spray, 2 sprays per nostril daily as needed. Proper nasal spray technique has been discussed and demonstrated.  I have also recommended nasal saline spray (i.e., Simply Saline) or nasal saline lavage (i.e., NeilMed) as needed prior to medicated nasal sprays.

## 2016-06-17 NOTE — Assessment & Plan Note (Deleted)
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  A lab order form has been provided for serum specific IgE against aeroallergen panel.  Continue levocetirizine, 5 mg daily as needed.  A prescription has been provided for fluticasone nasal spray, 2 sprays per nostril daily as needed. Proper nasal spray technique has been discussed and demonstrated.  I have also recommended nasal saline spray (i.e., Simply Saline) or nasal saline lavage (i.e., NeilMed) as needed prior to medicated nasal sprays.

## 2016-06-17 NOTE — Assessment & Plan Note (Signed)
Associated angioedema occurs in up to 50% of patients with chronic urticaria.  Treatment/diagnostic plan as outlined above. 

## 2017-01-14 ENCOUNTER — Telehealth: Payer: Self-pay | Admitting: Family Medicine

## 2017-01-14 NOTE — Telephone Encounter (Signed)
PLEASE NOTE: All timestamps contained within this report are represented as [place] Standard Time. CONFIDENTIALTY NOTICE: This fax transmission is intended only for the addressee. It contains information that is legally privileged, confidential or otherwise protected from use or disclosure. If you are not the intended recipient, you are strictly prohibited from reviewing, disclosing, copying using or disseminating any of this information or taking any action in reliance on or regarding this information. If you have received this fax in error, please notify us immediately by telephone so that we can arrange for its return to [place]. Phone: [phone], Toll-Free: [phone], Fax: [phone] Page: 1 of 2 Call Id: [id number] [place] Patient Name: Anna [name] Gender: Female DOB: Nov 15, 1966 Age: 51 Y 76 M 13 D Return Phone Number: [phone] (Primary) Address: City/State/Zip: [place] [address] Client [facility] - Client Client Site [place] MD Contact Type Call Who Is Calling Patient / Member / Family / Caregiver Call Type Triage / Clinical Relationship To Patient Self Return Phone Number [phone] (Primary) Chief Complaint WHEEZING Reason for Call Symptomatic / Request for [place] states she would like to make an appt. She has a lot of wheezing in her chest and she feels like she needs to cough up phlegm. Appointment Disposition EMR Appointment Not Necessary Info pasted into Epic Yes PreDisposition Call Doctor Translation No Nurse Assessment Nurse: Ronnald Ramp, RN, Miranda Date/Time [name] Time): 01/14/2017 12:08:02 PM Confirm and document reason for call. If symptomatic, describe symptoms. ---Caller states she has a congestion in her chest for a couple of days. Denies  fever. She denies wheezing, she describes it as mucus. Does the patient have any new or worsening symptoms? ---Yes Will a triage be completed? ---Yes Related visit to physician within the last 2 weeks? ---No Does the PT have any chronic conditions? (i.e. diabetes, asthma, etc.) ---No Is the patient pregnant or possibly pregnant? (Ask all females between the ages of 43-55) ---No Is this a behavioral health or substance abuse call? ---No Guidelines Guideline Title Affirmed Question Affirmed Notes Nurse Date/Time (Eastern Time) Cough - Acute Productive Cough (all triage questions negative) Ronnald Ramp, RN, Miranda 01/14/2017 12:09:35 PM Disp. Time [name] Time) Disposition Final User 01/14/2017 12:04:10 PM Send to Urgent Queue Barnie Mort 01/14/2017 12:13:09 PM Home Care Yes Ronnald Ramp, RN, Miranda PLEASE NOTE: All timestamps contained within this report are represented as [place] Standard Time. CONFIDENTIALTY NOTICE: This fax transmission is intended only for the addressee. It contains information that is legally privileged, confidential or otherwise protected from use or disclosure. If you are not the intended recipient, you are strictly prohibited from reviewing, disclosing, copying using or disseminating any of this information or taking any action in reliance on or regarding this information. If you have received this fax in error, please notify us immediately by telephone so that we can arrange for its return to [place]. Phone: [phone], Toll-Free: [phone], Fax: [phone] Page: 2 of 2 Call Id: [id number] Caller Understands: Yes Disagree/Comply: Comply Care Advice Given Per Guideline HOME CARE: You should be able to treat this at home. REASSURANCE: * It doesn't sound like a serious cough. * Coughing up mucus is very important for protecting the lungs from pneumonia. COUGH MEDICINES: - HOME REMEDY - HARD CANDY: Hard candy works just as well as medicine-flavored OTC cough drops. Diabetics  should use sugar-free candy. -  HOME REMEDY - HONEY: This old home remedy has been shown to help decrease coughing at night. The adult dosage is 2 teaspoons (10 ml) at bedtime. Honey should not be given to infants under one year of age. HUMIDIFIER: If the air is dry, use a humidifier in the bedroom. (Reason: dry air makes coughs worse) AVOID TOBACCO SMOKE: Smoking or being exposed to smoke makes coughs much worse. * Drink adequate liquids. PREVENT DEHYDRATION: * This will help soothe an irritated or dry throat and loosen up the phlegm. EXPECTED COURSE: Viral bronchitis causes a cough for 1 to 3 weeks. Sometimes you may cough up lots of phlegm (mucus). The mucus can normally be white, gray, yellow or green. CALL BACK IF: * Cough lasts over 3 weeks * Continuous coughing persists over 2 hours after cough treatment * Difficulty breathing occurs * Fever over 103 F (39.4 C) * Fever lasts over 3 days * You become worse. CARE ADVICE given per Cough - Acute Productive (Adult) guideline. Comments User: Leverne Humbles, RN Date/Time [name] Time): 01/14/2017 12:13:05 PM Also suggested caller take Mucinex to help break up the mucus.

## 2017-01-14 NOTE — Telephone Encounter (Signed)
Patient Name: Anna [name] DOB: 06-13-1966 Initial Comment Caller states she would like to make an appt. She has a lot of wheezing in her chest and she feels like she needs to cough up phlegm. Nurse Assessment Nurse: Ronnald Ramp, RN, Miranda Date/Time [name] Time): 01/14/2017 12:08:02 PM Confirm and document reason for call. If symptomatic, describe symptoms. ---Caller states she has a congestion in her chest for a couple of days. Denies fever. She denies wheezing, she describes it as mucus. Does the patient have any new or worsening symptoms? ---Yes Will a triage be completed? ---Yes Related visit to physician within the last 2 weeks? ---No Does the PT have any chronic conditions? (i.e. diabetes, asthma, etc.) ---No Is the patient pregnant or possibly pregnant? (Ask all females between the ages of 80-55) ---No Is this a behavioral health or substance abuse call? ---No Guidelines Guideline Title Affirmed Question Affirmed Notes Cough - Acute Productive Cough (all triage questions negative) Final Disposition User Dane, RN, Miranda Comments Also suggested caller take Mucinex to help break up the mucus. Disagree/Comply: Comply

## 2017-01-22 ENCOUNTER — Other Ambulatory Visit: Payer: Self-pay | Admitting: Family Medicine

## 2017-01-22 DIAGNOSIS — Z1231 Encounter for screening mammogram for malignant neoplasm of breast: Secondary | ICD-10-CM

## 2017-02-04 ENCOUNTER — Ambulatory Visit: Payer: 59 | Admitting: Women's Health

## 2017-02-05 ENCOUNTER — Ambulatory Visit (INDEPENDENT_AMBULATORY_CARE_PROVIDER_SITE_OTHER): Payer: 59 | Admitting: Gynecology

## 2017-02-05 ENCOUNTER — Encounter: Payer: Self-pay | Admitting: Gynecology

## 2017-02-05 VITALS — BP 124/74

## 2017-02-05 DIAGNOSIS — R102 Pelvic and perineal pain: Secondary | ICD-10-CM | POA: Diagnosis not present

## 2017-02-05 LAB — URINALYSIS W MICROSCOPIC + REFLEX CULTURE
Bilirubin Urine: NEGATIVE
CASTS: NONE SEEN [LPF]
Crystals: NONE SEEN [HPF]
Glucose, UA: NEGATIVE
Hgb urine dipstick: NEGATIVE
Ketones, ur: NEGATIVE
LEUKOCYTES UA: NEGATIVE
NITRITE: NEGATIVE
PH: 5.5 (ref 5.0–8.0)
Protein, ur: NEGATIVE
RBC / HPF: NONE SEEN RBC/HPF (ref <=2)
SPECIFIC GRAVITY, URINE: 1.02 (ref 1.001–1.035)
YEAST: NONE SEEN [HPF]

## 2017-02-05 MED ORDER — SULFAMETHOXAZOLE-TRIMETHOPRIM 800-160 MG PO TABS: 1.0000 | ORAL_TABLET | Freq: Two times a day (BID) | ORAL | 0 refills | Status: DC

## 2017-02-05 NOTE — Patient Instructions (Signed)
Take antibiotic twice daily for 3 days. Follow up if your symptoms persist, worsen or recur. 

## 2017-02-05 NOTE — Addendum Note (Signed)
Addended by: Nelva Nay on: 02/05/2017 02:24 PM   Modules accepted: Orders

## 2017-02-05 NOTE — Progress Notes (Signed)
    Anna [name] 19-Feb-1966 [id number]        50 y.o.  [id number] presents with 2 weeks of suprapubic pressure. No frequency dysuria or urgency low back pain fever or chills. No constipation diarrhea nausea or vomiting. 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 Status post LAVH in the past.  Past medical history,surgical history, problem list, medications, allergies, family history and social history were all reviewed and documented in the EPIC chart.  Directed ROS with pertinent positives and negatives documented in the history of present illness/assessment and plan.  Exam: [name] assistant Vitals:   02/05/17 1144  BP: 124/74   General appearance:  Normal Spine straight without CVA tenderness Abdomen soft nontender without masses guarding rebound Pelvic external BUS vagina normal. Bimanual exam without masses or tenderness.  Assessment/Plan:  51 y.o. [id number] with history of suprapubic pressure 2 weeks. Urinalysis does show 0-5 WBC with few bacteria. We'll culture for completeness. Will cover as low-grade's cystitis with Septra DS 1 by mouth twice a day 3 days. Need to push fluids to take in adequate liquids daily and void on a regular basis discussed. Follow up if symptoms persist, worsen or recur.    Anastasio Auerbach MD, 12:00 PM 02/05/2017

## 2017-02-06 LAB — URINE CULTURE: ORGANISM ID, BACTERIA: NO GROWTH

## 2017-02-18 ENCOUNTER — Ambulatory Visit
Admission: RE | Admit: 2017-02-18 | Discharge: 2017-02-18 | Disposition: A | Discharge status: Home / Self Care | Payer: 59 | Source: Ambulatory Visit | Attending: Family Medicine | Admitting: Family Medicine

## 2017-02-18 DIAGNOSIS — Z1231 Encounter for screening mammogram for malignant neoplasm of breast: Secondary | ICD-10-CM

## 2017-04-02 ENCOUNTER — Encounter: Payer: Self-pay | Admitting: Gynecology

## 2017-04-02 ENCOUNTER — Ambulatory Visit (INDEPENDENT_AMBULATORY_CARE_PROVIDER_SITE_OTHER): Payer: 59 | Admitting: Gynecology

## 2017-04-02 VITALS — BP 120/70 | Ht 64.0 in | Wt 140.0 lb

## 2017-04-02 DIAGNOSIS — Z113 Encounter for screening for infections with a predominantly sexual mode of transmission: Secondary | ICD-10-CM

## 2017-04-02 DIAGNOSIS — Z01419 Encounter for gynecological examination (general) (routine) without abnormal findings: Secondary | ICD-10-CM | POA: Diagnosis not present

## 2017-04-02 LAB — CBC WITH DIFFERENTIAL/PLATELET
BASOS ABS: 0 {cells}/uL (ref 0–200)
BASOS PCT: 0 %
EOS ABS: 110 {cells}/uL (ref 15–500)
Eosinophils Relative: 2 %
HCT: 40.2 % (ref 35.0–45.0)
HEMOGLOBIN: 12.9 g/dL (ref 11.7–15.5)
LYMPHS ABS: 1265 {cells}/uL (ref 850–3900)
Lymphocytes Relative: 23 %
MCH: 28.4 pg (ref 27.0–33.0)
MCHC: 32.1 g/dL (ref 32.0–36.0)
MCV: 88.4 fL (ref 80.0–100.0)
MONO ABS: 385 {cells}/uL (ref 200–950)
MPV: 8.5 fL (ref 7.5–12.5)
Monocytes Relative: 7 %
NEUTROS ABS: 3740 {cells}/uL (ref 1500–7800)
Neutrophils Relative %: 68 %
Platelets: 369 10*3/uL (ref 140–400)
RBC: 4.55 MIL/uL (ref 3.80–5.10)
RDW: 13.2 % (ref 11.0–15.0)
WBC: 5.5 10*3/uL (ref 3.8–10.8)

## 2017-04-02 LAB — COMPREHENSIVE METABOLIC PANEL
ALBUMIN: 3.9 g/dL (ref 3.6–5.1)
ALK PHOS: 55 U/L (ref 33–115)
ALT: 9 U/L (ref 6–29)
AST: 12 U/L (ref 10–35)
BUN: 13 mg/dL (ref 7–25)
CHLORIDE: 105 mmol/L (ref 98–110)
CO2: 24 mmol/L (ref 20–31)
CREATININE: 0.8 mg/dL (ref 0.50–1.10)
Calcium: 9 mg/dL (ref 8.6–10.2)
Glucose, Bld: 89 mg/dL (ref 65–99)
POTASSIUM: 3.9 mmol/L (ref 3.5–5.3)
Sodium: 139 mmol/L (ref 135–146)
TOTAL PROTEIN: 6.7 g/dL (ref 6.1–8.1)
Total Bilirubin: 0.5 mg/dL (ref 0.2–1.2)

## 2017-04-02 NOTE — Patient Instructions (Signed)

## 2017-04-02 NOTE — Progress Notes (Signed)
    Anna [name] 11/12/1966 [id number]        51 y.o.  [id number] for annual exam.    Past medical history,surgical history, problem list, medications, allergies, family history and social history were all reviewed and documented as reviewed in the EPIC chart.  ROS:  Performed with pertinent positives and negatives included in the history, assessment and plan.   Additional significant findings :  None   Exam: [name] assistant Vitals:   04/02/17 1004  BP: 120/70  Weight: 140 lb (63.5 kg)  Height: 5\' 4"  (1.626 m)   Body mass index is 24.03 kg/m.  General appearance:  Normal affect, orientation and appearance. Skin: Grossly normal HEENT: Without gross lesions.  No cervical or supraclavicular adenopathy. Thyroid normal.  Lungs:  Clear without wheezing, rales or rhonchi Cardiac: RR, without RMG Abdominal:  Soft, nontender, without masses, guarding, rebound, organomegaly or hernia Breasts:  Examined lying and sitting without masses, retractions, discharge or axillary adenopathy. Pelvic:  Ext, BUS, Vagina: With atrophic changes. GC/Chlamydia of vagina and urethra done  Adnexa: Without masses or tenderness    Anus and perineum: Normal   Rectovaginal: Normal sphincter tone without palpated masses or tenderness.    Assessment/Plan:  51 y.o. [id number] female for annual exam.   1. Status post LAVH for leiomyoma 2014. Doing well without significant hot flushes, night sweats or vaginal dryness. 2. Requests STD screening. Ended a relationship 6 months ago and wants to be screened. No known exposure. GC/Chlamydia, HIV, RPR, hepatitis B, hepatitis C done. 3. Pap smear 02/2015. No Pap smear done today. No history of abnormal Pap smears. Options to stop screening per current screening guidelines based on hysterectomy reviewed. Will readdress on an annual basis. 4. Health maintenance. Baseline CBC, CMP, urinalysis along with STD screening ordered. Lipid profile last year excellent and not  repeated. Follow up 1 year, sooner as needed.   Anastasio Auerbach MD, 10:19 AM 04/02/2017

## 2017-04-03 LAB — HEPATITIS B SURFACE ANTIGEN: HEP B S AG: NEGATIVE

## 2017-04-03 LAB — URINALYSIS W MICROSCOPIC + REFLEX CULTURE
BACTERIA UA: NONE SEEN [HPF]
BILIRUBIN URINE: NEGATIVE
Casts: NONE SEEN [LPF]
Crystals: NONE SEEN [HPF]
GLUCOSE, UA: NEGATIVE
HGB URINE DIPSTICK: NEGATIVE
KETONES UR: NEGATIVE
LEUKOCYTES UA: NEGATIVE
Nitrite: NEGATIVE
PH: 6 (ref 5.0–8.0)
PROTEIN: NEGATIVE
RBC / HPF: NONE SEEN RBC/HPF (ref <=2)
Specific Gravity, Urine: 1.004 (ref 1.001–1.035)
YEAST: NONE SEEN [HPF]

## 2017-04-03 LAB — RPR

## 2017-04-03 LAB — HEPATITIS C ANTIBODY: HCV AB: NEGATIVE

## 2017-04-03 LAB — HIV ANTIBODY (ROUTINE TESTING W REFLEX): HIV 1&2 Ab, 4th Generation: NONREACTIVE

## 2017-04-04 LAB — URINE CULTURE: Organism ID, Bacteria: NO GROWTH

## 2017-04-04 LAB — GC/CHLAMYDIA PROBE AMP
CT Probe RNA: NOT DETECTED
GC Probe RNA: NOT DETECTED

## 2017-06-02 ENCOUNTER — Ambulatory Visit (INDEPENDENT_AMBULATORY_CARE_PROVIDER_SITE_OTHER): Payer: 59 | Admitting: Primary Care

## 2017-06-02 ENCOUNTER — Encounter: Payer: Self-pay | Admitting: Primary Care

## 2017-06-02 VITALS — BP 122/82 | HR 72 | Temp 98.0°F | Ht 64.0 in | Wt 134.8 lb

## 2017-06-02 DIAGNOSIS — T7840XA Allergy, unspecified, initial encounter: Secondary | ICD-10-CM | POA: Diagnosis not present

## 2017-06-02 MED ORDER — PREDNISONE 10 MG PO TABS: ORAL_TABLET | ORAL | 0 refills | Status: DC

## 2017-06-02 NOTE — Progress Notes (Signed)
Subjective:    Patient ID: Anna [name], female    DOB: Apr 23, 1966, 51 y.o.   MRN: [id number]  HPI  Ms. Anna [name] is a 51 year old female who presents today with a chief complaint of allergic reaction. She went to the salon on Friday last week and had a treatment with black hair dye. On Saturday morning she noticed tenderness to her scalp and then gradually has noticed her skin breaking out. Since then her scalp and forehead have continued to break out. She's been taking Benadryl with some improvement in itching, but little improvement in breakouts/scalp tenderness. She denies shortness of breath, wheezing, throat closure. She's had a reaction to hair dye in the past, but this is worse.   Review of Systems  Respiratory: Negative for shortness of breath and wheezing.   Cardiovascular: Negative for chest pain.  Skin: Positive for rash.       Past Medical History:  Diagnosis Date  . Allergic rhinitis   . 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 Genital warts   . GERD (gastroesophageal reflux disease)   . Hiatal hernia    EGD- gastritis, esophagitis 09/2004  . History of HPV infection   . Knee MCL sprain 2007     Social History   Social History  . Marital status: Widowed    Spouse name: N/A  . Number of children: N/A  . Years of education: N/A   Occupational History  . Not on file.   Social History Main Topics  . Smoking status: Never Smoker  . Smokeless tobacco: Never Used  . Alcohol use No  . Drug use: No  . Sexual activity: Not Currently    Birth control/ protection: Surgical     Comment: HYST-1st intercourse 27 yo-5 partners   Other Topics Concern  . Not on file   Social History Narrative   Exercises regularly    Past Surgical History:  Procedure Laterality Date  . LAPAROSCOPIC ASSISTED VAGINAL HYSTERECTOMY N/A 07/27/2013   Procedure: LAPAROSCOPIC ASSISTED VAGINAL HYSTERECTOMY;  Surgeon: Anastasio Auerbach, MD for leiomyomata    Family History  Problem  Relation Age of Onset  . Aneurysm Father        cerebral  . Breast cancer Maternal Aunt        50's  . Cancer Paternal Aunt        Uterine    No Known Allergies  Current Outpatient Prescriptions on File Prior to Visit  Medication Sig Dispense Refill  . cetirizine (ZYRTEC) 10 MG tablet Take 10 mg by mouth daily.    . Cholecalciferol (VITAMIN D PO) Take by mouth.    . diphenhydrAMINE (BENADRYL) 50 MG capsule Take 50 mg by mouth at bedtime.    . Multiple Vitamins-Minerals (HAIR/SKIN/NAILS PO) Take 1 tablet by mouth daily. Reported on 03/29/2016     Current Facility-Administered Medications on File Prior to Visit  Medication Dose Route Frequency Provider Last Rate Last Dose  . predniSONE (DELTASONE) tablet 10 mg  10 mg Oral UD Bobbitt, [place] Muta, MD        BP 122/82   Pulse 72   Temp 98 F (36.7 C) (Oral)   Ht 5\' 4"  (1.626 m)   Wt 134 lb 12.8 oz (61.1 kg)   LMP 07/11/2013   SpO2 98%   BMI 23.14 kg/m    Objective:   Physical Exam  Constitutional: She appears well-nourished.  Neck: Neck supple.  Cardiovascular: Normal rate and regular rhythm.  Pulmonary/Chest: Effort normal and breath sounds normal.  Skin: Skin is warm and dry.  Moderate allergic dermatitis to forehead, mild dermatitis to scalp.           Assessment & Plan:  Allergic Reaction:  Dermatitis to scalp and forehead since encountering hair dye Friday. Exam today with evidence of allergy involvement. No airway compromise. Rx for low dose prednisone course sent to pharmacy. Discussed to add Pepcid or Zantac to Benadryl for relief. Avoid hair dye products. Follow up PRN.  Sheral Flow, NP

## 2017-06-02 NOTE — Patient Instructions (Addendum)
Start prednisone 10 mg tablets. Take 3 tablets for three days, then 2 tablets for three days, then 1 tablet for three days.  Try switching back to Zyrtec from Benadryl if no improvement in itching.  Start either Pepcid or Zantac with the Benadryl or Zyrtec.  It was a pleasure meeting you!

## 2017-07-01 ENCOUNTER — Encounter: Payer: Self-pay | Admitting: Gynecology

## 2017-07-01 ENCOUNTER — Ambulatory Visit (INDEPENDENT_AMBULATORY_CARE_PROVIDER_SITE_OTHER): Payer: 59 | Admitting: Gynecology

## 2017-07-01 VITALS — BP 116/72

## 2017-07-01 DIAGNOSIS — B9689 Other specified bacterial agents as the cause of diseases classified elsewhere: Secondary | ICD-10-CM | POA: Diagnosis not present

## 2017-07-01 DIAGNOSIS — B3731 Acute candidiasis of vulva and vagina: Secondary | ICD-10-CM

## 2017-07-01 DIAGNOSIS — B373 Candidiasis of vulva and vagina: Secondary | ICD-10-CM | POA: Diagnosis not present

## 2017-07-01 DIAGNOSIS — N760 Acute vaginitis: Secondary | ICD-10-CM

## 2017-07-01 LAB — WET PREP FOR TRICH, YEAST, CLUE: Trich, Wet Prep: NONE SEEN

## 2017-07-01 MED ORDER — CLINDAMYCIN PHOSPHATE 2 % VA CREA: 1.0000 | TOPICAL_CREAM | Freq: Every day | VAGINAL | 0 refills | Status: DC

## 2017-07-01 MED ORDER — FLUCONAZOLE 150 MG PO TABS: 150.0000 mg | ORAL_TABLET | Freq: Once | ORAL | 0 refills | Status: AC

## 2017-07-01 NOTE — Patient Instructions (Signed)
Take the Diflucan pill once. Use the Cleocin vaginal cream nightly for 7 nights.  Follow up if symptoms persist, worsen or recur.

## 2017-07-01 NOTE — Addendum Note (Signed)
Addended by: Nelva Nay on: 07/01/2017 11:18 AM   Modules accepted: Orders

## 2017-07-01 NOTE — Progress Notes (Signed)
    Anna [name] 1966/04/02 [id number]        50 y.o.  [id number] presents with 2 days of vaginal irritation and slight itching. No discharge or odor. No urinary symptoms such as frequency, dysuria, urgency, low back, pain fever or chills. No nausea vomiting diarrhea constipation.  Past medical history,surgical history, problem list, medications, allergies, family history and social history were all reviewed and documented in the EPIC chart.  Directed ROS with pertinent positives and negatives documented in the history of present illness/assessment and plan.  Exam: [name] assistant Vitals:   07/01/17 1002  BP: 116/72   General appearance:  Normal Abdomen soft nontender without masses guarding rebound Pelvic external BUS vagina with white discharge and mild erythema. Bimanual without masses or tenderness.  Assessment/Plan:  51 y.o. [id number] with history and exam as above. Wet prep does show yeast and moderate clue cells. Will cover for both yeast and bacterial vaginosis with Diflucan 150 mg 1 dose and Cleocin vaginal cream nightly 7 nights. Patient will follow up if her symptoms persist, worsen or recur.    Anastasio Auerbach MD, 10:08 AM 07/01/2017

## 2017-08-13 ENCOUNTER — Encounter: Payer: Self-pay | Admitting: Family Medicine

## 2017-08-13 ENCOUNTER — Ambulatory Visit (INDEPENDENT_AMBULATORY_CARE_PROVIDER_SITE_OTHER): Payer: 59 | Admitting: Family Medicine

## 2017-08-13 VITALS — BP 108/68 | HR 71 | Temp 98.7°F | Ht 64.0 in | Wt 135.8 lb

## 2017-08-13 DIAGNOSIS — R194 Change in bowel habit: Secondary | ICD-10-CM

## 2017-08-13 DIAGNOSIS — R1084 Generalized abdominal pain: Secondary | ICD-10-CM

## 2017-08-13 MED ORDER — DICYCLOMINE HCL 10 MG PO CAPS: ORAL_CAPSULE | ORAL | 1 refills | Status: DC

## 2017-08-13 NOTE — Patient Instructions (Addendum)
I wonder if you may have some IBS  Drink fluids and get fiber in your diet  Get Align over the counter and take as directed (probiotic) for 2-4 weeks  Try the px for cramping as needed (generic for bentyl) Labs today  We will refer you to GI for this and also to discuss screening colonoscopy   (our office will call you about that)   If symptoms suddenly get severe - go to the ER   For heartburn or upper abdominal pain- you can try zantac over the counter 150 mg twice daily as needed

## 2017-08-13 NOTE — Progress Notes (Signed)
Subjective:    Patient ID: Anna [name], female    DOB: 21-Apr-1966, 51 y.o.   MRN: [id number]  HPI  Here for stomach pain for 2 weeks   It started as cramping all over abdomen - lasted 2 h (almost went to ED) Then started in upper abdomen  occ it will be worse on R side and radiate to pelvis She was constipated for a while (when that got better she started having urgent stools after she ate ) One loose stool yesterday  She did not take anything for constipation   Mild heartburn if any  No swallowing problems   Drinks 32 oz of water when she gets up in am  She also works out   At times she does not think she eats enough (trying to eat healthy)  Gets too busy to eat at times  No caffeine   No vomiting  Some nausea-no chance pregnant   No fever  occ hot flashes   Not taking any nsaids at all   No otc meds -no ppi or H2   No change in stress   Is going to turn 51 next month    Wt Readings from Last 3 Encounters:  08/13/17 135 lb 12.8 oz (61.6 kg)  06/02/17 134 lb 12.8 oz (61.1 kg)  04/02/17 140 lb (63.5 kg)   23.31 kg/m  She has a hx of gastritis in the past as well as gerd with mild esophagitis  Took omeprazole in the past   Lab Results  Component Value Date   WBC 5.5 04/02/2017   HGB 12.9 04/02/2017   HCT 40.2 04/02/2017   MCV 88.4 04/02/2017   PLT 369 04/02/2017    Patient Active Problem List   Diagnosis Date Noted  . Abdominal pain, generalized 08/13/2017  . Change in stool habits 08/13/2017  . Urticaria 06/11/2016  . Angioedema 06/11/2016  . Lip swelling 05/07/2016  . Lumbar pain 01/02/2016  . Gastritis 11/06/2015  . Folliculitis 91/63/8466  . Allergic rhinitis 03/20/2007  . GERD 03/20/2007   Past Medical History:  Diagnosis Date  . Allergic rhinitis   . 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 Genital warts   . GERD (gastroesophageal reflux disease)   . Hiatal hernia    EGD- gastritis, esophagitis 09/2004  . History of HPV  infection   . Knee MCL sprain 2007   Past Surgical History:  Procedure Laterality Date  . LAPAROSCOPIC ASSISTED VAGINAL HYSTERECTOMY N/A 07/27/2013   Procedure: LAPAROSCOPIC ASSISTED VAGINAL HYSTERECTOMY;  Surgeon: Anastasio Auerbach, MD for leiomyomata   Social History  Substance Use Topics  . Smoking status: Never Smoker  . Smokeless tobacco: Never Used  . Alcohol use No   Family History  Problem Relation Age of Onset  . Aneurysm Father        cerebral  . Breast cancer Maternal Aunt        50's  . Cancer Paternal Aunt        Uterine   No Known Allergies Current Outpatient Prescriptions on File Prior to Visit  Medication Sig Dispense Refill  . Cholecalciferol (VITAMIN D PO) Take by mouth.    . diphenhydrAMINE (BENADRYL) 50 MG capsule Take 50 mg by mouth at bedtime.    . Multiple Vitamins-Minerals (HAIR/SKIN/NAILS PO) Take 1 tablet by mouth daily. Reported on 03/29/2016    . cetirizine (ZYRTEC) 10 MG tablet Take 10 mg by mouth daily.     No current facility-administered medications  on file prior to visit.     Review of Systems Review of Systems  Constitutional: Negative for fever, appetite change, fatigue and unexpected weight change.  Eyes: Negative for pain and visual disturbance.  Respiratory: Negative for cough and shortness of breath.   Cardiovascular: Negative for cp or palpitations    Gastrointestinal: Negative for vomiting/blood in stool or dark stool  Pos for bloating  Genitourinary: Negative for urgency and frequency. neg for hematuria or dysuria  Skin: Negative for pallor or rash   Neurological: Negative for weakness, light-headedness, numbness and headaches.  Hematological: Negative for adenopathy. Does not bruise/bleed easily.  Psychiatric/Behavioral: Negative for dysphoric mood. The patient is not nervous/anxious.         Objective:   Physical Exam  Constitutional: She appears well-developed and well-nourished. No distress.  Well appearing  HENT:  Head:  Normocephalic and atraumatic.  Mouth/Throat: Oropharynx is clear and moist.  Eyes: Pupils are equal, round, and reactive to light. Conjunctivae and EOM are normal. No scleral icterus.  Neck: Normal range of motion. Neck supple.  Cardiovascular: Normal rate, regular rhythm and normal heart sounds.   Pulmonary/Chest: Effort normal and breath sounds normal. No respiratory distress. She has no wheezes. She has no rales.  Abdominal: Soft. Bowel sounds are normal. She exhibits no distension and no mass. There is no hepatosplenomegaly. There is generalized tenderness. There is no rebound, no guarding, no CVA tenderness, no tenderness at McBurney's point and negative Murphy's sign.  abd tenderness around umbilicus primarily  No rebound or guarding   Lymphadenopathy:    She has no cervical adenopathy.  Neurological: She is alert.  Skin: Skin is warm and dry. No erythema. No pallor.  No pallor or jaundice   Psychiatric: She has a normal mood and affect.          Assessment & Plan:   Problem List Items Addressed This Visit      Other   Abdominal pain, generalized    Intermittent cramping and bloating with change in bowel habits  Due for her screening colonoscopy next mo as well so will ref to GI  ? Poss IBS Disc diet  Will try probiotic/align  Dicyclomine prn for cramping  If any acid symptoms- zantac 150 otc  Update if worse or no improvement  Lab today for abd pain as well       Relevant Orders   CBC with Differential/Platelet   Comprehensive metabolic panel   Amylase   Lipase   Ambulatory referral to Gastroenterology   Change in stool habits    May be IBS  Ref to GI (also due for screen colonoscopy) Probiotic  Dicyclomine prn Diet / fiber and fluids  lab      Relevant Orders   Ambulatory referral to Gastroenterology

## 2017-08-14 LAB — COMPREHENSIVE METABOLIC PANEL
ALBUMIN: 4 g/dL (ref 3.5–5.2)
ALK PHOS: 54 U/L (ref 39–117)
ALT: 12 U/L (ref 0–35)
AST: 13 U/L (ref 0–37)
BILIRUBIN TOTAL: 0.5 mg/dL (ref 0.2–1.2)
BUN: 17 mg/dL (ref 6–23)
CALCIUM: 8.9 mg/dL (ref 8.4–10.5)
CHLORIDE: 105 meq/L (ref 96–112)
CO2: 28 mEq/L (ref 19–32)
CREATININE: 0.7 mg/dL (ref 0.40–1.20)
GFR: 113.93 mL/min (ref >60.00)
Glucose, Bld: 87 mg/dL (ref 70–99)
Potassium: 3.9 mEq/L (ref 3.5–5.1)
Sodium: 137 mEq/L (ref 135–145)
TOTAL PROTEIN: 7.1 g/dL (ref 6.0–8.3)

## 2017-08-14 LAB — LIPASE: LIPASE: 22 U/L (ref 11.0–59.0)

## 2017-08-14 LAB — CBC WITH DIFFERENTIAL/PLATELET
BASOS PCT: 1.1 % (ref 0.0–3.0)
Basophils Absolute: 0.1 10*3/uL (ref 0.0–0.1)
EOS ABS: 0.3 10*3/uL (ref 0.0–0.7)
Eosinophils Relative: 3.9 % (ref 0.0–5.0)
HEMATOCRIT: 39.6 % (ref 36.0–46.0)
HEMOGLOBIN: 13.2 g/dL (ref 12.0–15.0)
LYMPHS PCT: 31.1 % (ref 12.0–46.0)
Lymphs Abs: 2.1 10*3/uL (ref 0.7–4.0)
MCHC: 33.4 g/dL (ref 30.0–36.0)
MCV: 88.5 fl (ref 78.0–100.0)
Monocytes Absolute: 0.6 10*3/uL (ref 0.1–1.0)
Monocytes Relative: 9.3 % (ref 3.0–12.0)
Neutro Abs: 3.7 10*3/uL (ref 1.4–7.7)
Neutrophils Relative %: 54.6 % (ref 43.0–77.0)
Platelets: 360 10*3/uL (ref 150.0–400.0)
RBC: 4.48 Mil/uL (ref 3.87–5.11)
RDW: 13.4 % (ref 11.5–15.5)
WBC: 6.8 10*3/uL (ref 4.0–10.5)

## 2017-08-14 LAB — AMYLASE: AMYLASE: 78 U/L (ref 27–131)

## 2017-08-14 NOTE — Assessment & Plan Note (Signed)
May be IBS  Ref to GI (also due for screen colonoscopy) Probiotic  Dicyclomine prn Diet / fiber and fluids  lab

## 2017-08-14 NOTE — Assessment & Plan Note (Signed)
Intermittent cramping and bloating with change in bowel habits  Due for her screening colonoscopy next mo as well so will ref to GI  ? Poss IBS Disc diet  Will try probiotic/align  Dicyclomine prn for cramping  If any acid symptoms- zantac 150 otc  Update if worse or no improvement  Lab today for abd pain as well

## 2017-08-21 ENCOUNTER — Encounter: Payer: Self-pay | Admitting: Gastroenterology

## 2017-09-15 ENCOUNTER — Ambulatory Visit: Payer: 59 | Admitting: Gastroenterology

## 2017-09-24 ENCOUNTER — Encounter: Payer: Self-pay | Admitting: Gastroenterology

## 2017-09-24 ENCOUNTER — Ambulatory Visit (INDEPENDENT_AMBULATORY_CARE_PROVIDER_SITE_OTHER): Payer: 59 | Admitting: Gastroenterology

## 2017-09-24 VITALS — BP 108/74 | HR 72 | Ht 63.5 in | Wt 137.1 lb

## 2017-09-24 DIAGNOSIS — R1013 Epigastric pain: Secondary | ICD-10-CM | POA: Diagnosis not present

## 2017-09-24 DIAGNOSIS — R110 Nausea: Secondary | ICD-10-CM

## 2017-09-24 DIAGNOSIS — Z1211 Encounter for screening for malignant neoplasm of colon: Secondary | ICD-10-CM

## 2017-09-24 DIAGNOSIS — K219 Gastro-esophageal reflux disease without esophagitis: Secondary | ICD-10-CM | POA: Diagnosis not present

## 2017-09-24 DIAGNOSIS — R109 Unspecified abdominal pain: Secondary | ICD-10-CM

## 2017-09-24 MED ORDER — NA SULFATE-K SULFATE-MG SULF 17.5-3.13-1.6 GM/177ML PO SOLN: 1.0000 | Freq: Once | ORAL | 0 refills | Status: AC

## 2017-09-24 MED ORDER — RANITIDINE HCL 150 MG PO TABS: 150.0000 mg | ORAL_TABLET | Freq: Every day | ORAL | 3 refills | Status: DC

## 2017-09-24 NOTE — Progress Notes (Signed)
Anna [name]    [id number]    March 30, 1966  Primary Care Physician:Tower, Wynelle Fanny, MD  Referring Physician: Tower, Wynelle Fanny, MD [place], [place] [address]  Chief complaint:  Genralized abdominal pain HPI: 51 year old African-American female previously followed by Dr. Sharlett Iles, was last seen in 2005 here with complaints of abdominal pain. Patient developed severe abdominal pain on 08/13/2017 with severe cramping, worse on right side radiating to the pelvis. Patient was seen by Dr Alba Cory. Patient was constipated a few weeks prior to the episode and her eating habits had also changed. She was skipping meals as she was busy with work. She had 2 episodes of severe generalized abdominal pain associated with cramping and fecal urgency 6 weeks ago. She hasn't had any recent episodes. Her bowel habits have improved and is no longer constipated. She is trying to eat small frequent meals and also trying to drink adequate fluids. She was prescribed dicyclomine but patient hasn't taken it much.  Intermittent nausea associated with heartburn, taking Zantac as needed for breakthrough symptoms once or twice a week. She was taking Nexium in the past. She has hiatal hernia and history of chronic GERD. Denies any dysphagia, odynophagia, vomiting, melena or blood in stool. Patient had 2 EGDs, in 1996 and 2005 with evidence of mild erosive esophagitis.   Reviewed labs 04/02/2017 and 08/13/2017 all within normal limits (CBC, CMP, amylase and lipase) Wet prep positive for yeast and numerous Trichonomas bacteria 06/2017   Outpatient Encounter Prescriptions as of 09/24/2017  Medication Sig  . cetirizine (ZYRTEC) 10 MG tablet Take 10 mg by mouth daily.  . Cholecalciferol (VITAMIN D PO) Take by mouth.  . dicyclomine (BENTYL) 10 MG capsule Take 1 capsule by mouth up to every 8 hours for abdominal cramping  . diphenhydrAMINE (BENADRYL) 50 MG capsule Take 50 mg by mouth at bedtime.  .  Multiple Vitamins-Minerals (HAIR/SKIN/NAILS PO) Take 1 tablet by mouth daily. Reported on 03/29/2016   No facility-administered encounter medications on file as of 09/24/2017.     Allergies as of 09/24/2017  . (No Known Allergies)    Past Medical History:  Diagnosis Date  . Allergic rhinitis   . 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 Genital warts   . GERD (gastroesophageal reflux disease)   . Hiatal hernia    EGD- gastritis, esophagitis 09/2004  . History of HPV infection   . Knee MCL sprain 2007    Past Surgical History:  Procedure Laterality Date  . LAPAROSCOPIC ASSISTED VAGINAL HYSTERECTOMY N/A 07/27/2013   Procedure: LAPAROSCOPIC ASSISTED VAGINAL HYSTERECTOMY;  Surgeon: Anastasio Auerbach, MD for leiomyomata    Family History  Problem Relation Age of Onset  . Aneurysm Father        cerebral  . Breast cancer Maternal Aunt        50's  . Cancer Paternal Aunt        Uterine    Social History   Social History  . Marital status: Widowed    Spouse name: N/A  . Number of children: N/A  . Years of education: N/A   Occupational History  . Not on file.   Social History Main Topics  . Smoking status: Never Smoker  . Smokeless tobacco: Never Used  . Alcohol use No  . Drug use: No  . Sexual activity: Not Currently    Birth control/ protection: Surgical     Comment: HYST-1st  intercourse 68 yo-5 partners   Other Topics Concern  . Not on file   Social History Narrative   Exercises regularly      Review of systems: Review of Systems  Constitutional: Negative for fever and chills.  HENT: Negative.   Eyes: Negative for blurred vision.  Respiratory: Negative for cough, shortness of breath and wheezing.   Cardiovascular: Negative for chest pain and palpitations.  Gastrointestinal: as per HPI Genitourinary: Negative for dysuria, urgency, frequency and hematuria.  Musculoskeletal: Negative for myalgias, back pain and joint pain.  Skin: Negative for itching and  rash.  Neurological: Negative for dizziness, tremors, focal weakness, seizures and loss of consciousness.  Endo/Heme/Allergies: Positive for seasonal allergies.  Psychiatric/Behavioral: Negative for depression, suicidal ideas and hallucinations.  All other systems reviewed and are negative.   Physical Exam: Vitals:   09/24/17 0854  BP: 108/74  Pulse: 72   Body mass index is 23.91 kg/m. Gen:      No acute distress HEENT:  EOMI, sclera anicteric Neck:     No masses; no thyromegaly Lungs:    Clear to auscultation bilaterally; normal respiratory effort CV:         Regular rate and rhythm; no murmurs Abd:      + bowel sounds; soft, non-tender; no palpable masses, no distension Ext:    No edema; adequate peripheral perfusion Skin:      Warm and dry; no rash Neuro: alert and oriented x 3 Psych: normal mood and affect  Data Reviewed:  Reviewed labs, radiology imaging, old records and pertinent past GI work up  EGD 2005 Normal: Proximal Esophagus to Mid Esophagus. Not Seen: Tumor. Barrett's esophagus. Esophageal inflammation. Stricture.  - ESOPHAGEAL INFLAMMATION: as a result of reflux. Length of inflammation: 3 cm. [place] Classification: Grade A. Biopsy/Esoph Inflamtn taken. ICD9: Esophagitis, Reflux: 530.11. Comments: Bx. done to R/O Barrett's mucosa.  - MUCOSAL ABNORMALITY: Cardia to Antrum. Nodularity present. Erythematous mucosa. Red spots present. Granular mucosa. RUT done, results pending. ICD9: Gastritis, Unspecified: 535.50.   EGD 1996 Hiatal hernia, ?resolving esophagitis  Assessment and Plan/Recommendations:  51 year old female with history of hiatal hernia, chronic GERD here with abdominal pain, nausea and heartburn  Generalized abdominal pain and cramps: Likely secondary to constipation due to change in lifestyle and diet Symptoms have improved Advised patient to increase fluid intake 8-10 glasses daily Small frequent meals Peppermint oil (IB Gard) 3 times  daily as needed  Nausea, epigastric abdominal pain and heartburn secondary to GERD Discussed antireflux measures and lifestyle modification Continue Zantac 150 milligrams as needed for breakthrough symptoms  Colorectal cancer screening: Average risk We'll schedule colonoscopy The risks and benefits as well as alternatives of endoscopic procedure(s) have been discussed and reviewed. All questions answered. The patient agrees to proceed.  Damaris Hippo , MD [phone] Mon-Fri 8a-5p [phone] after 5p, weekends, holidays  CC: Tower, Wynelle Fanny, MD

## 2017-09-24 NOTE — Patient Instructions (Signed)
You have been scheduled for an endoscopy and colonoscopy. Please follow the written instructions given to you at your visit today. Please pick up your prep supplies at the pharmacy within the next 1-3 days. If you use inhalers (even only as needed), please bring them with you on the day of your procedure. Your physician has requested that you go to www.startemmi.com and enter the access code given to you at your visit today. This web site gives a general overview about your procedure. However, you should still follow specific instructions given to you by our office regarding your preparation for the procedure.  We will send Zantac to your pharmacy  Use over the counter IBGard 1 capsule three times a day as needed

## 2017-10-15 ENCOUNTER — Ambulatory Visit: Payer: 59 | Admitting: Gastroenterology

## 2017-10-21 ENCOUNTER — Encounter: Payer: Self-pay | Admitting: Gastroenterology

## 2017-11-03 ENCOUNTER — Ambulatory Visit (AMBULATORY_SURGERY_CENTER): Payer: 59 | Admitting: Gastroenterology

## 2017-11-03 ENCOUNTER — Other Ambulatory Visit: Payer: Self-pay

## 2017-11-03 ENCOUNTER — Encounter: Payer: Self-pay | Admitting: Gastroenterology

## 2017-11-03 VITALS — BP 117/75 | HR 56 | Temp 97.1°F | Resp 12 | Ht 64.0 in | Wt 135.0 lb

## 2017-11-03 DIAGNOSIS — Z1212 Encounter for screening for malignant neoplasm of rectum: Secondary | ICD-10-CM

## 2017-11-03 DIAGNOSIS — R1084 Generalized abdominal pain: Secondary | ICD-10-CM | POA: Diagnosis not present

## 2017-11-03 DIAGNOSIS — Z1211 Encounter for screening for malignant neoplasm of colon: Secondary | ICD-10-CM | POA: Diagnosis not present

## 2017-11-03 DIAGNOSIS — R1013 Epigastric pain: Secondary | ICD-10-CM | POA: Diagnosis not present

## 2017-11-03 MED ORDER — SODIUM CHLORIDE 0.9 % IV SOLN: 500.0000 mL | INTRAVENOUS | Status: DC

## 2017-11-03 NOTE — Progress Notes (Signed)
Called to room to assist during endoscopic procedure.  Patient ID and intended procedure confirmed with present staff. Received instructions for my participation in the procedure from the performing physician.  

## 2017-11-03 NOTE — Patient Instructions (Signed)
Discharge instructions given. Normal colon. Biopsies taken on Endoscopy. Resume previous medications. YOU HAD AN ENDOSCOPIC PROCEDURE TODAY AT [facility]:   Refer to the procedure report that was given to you for any specific questions about what was found during the examination.  If the procedure report does not answer your questions, please call your gastroenterologist to clarify.  If you requested that your care partner not be given the details of your procedure findings, then the procedure report has been included in a sealed envelope for you to review at your convenience later.  YOU SHOULD EXPECT: Some feelings of bloating in the abdomen. Passage of more gas than usual.  Walking can help get rid of the air that was put into your GI tract during the procedure and reduce the bloating. If you had a lower endoscopy (such as a colonoscopy or flexible sigmoidoscopy) you may notice spotting of blood in your stool or on the toilet paper. If you underwent a bowel prep for your procedure, you may not have a normal bowel movement for a few days.  Please Note:  You might notice some irritation and congestion in your nose or some drainage.  This is from the oxygen used during your procedure.  There is no need for concern and it should clear up in a day or so.  SYMPTOMS TO REPORT IMMEDIATELY:   Following lower endoscopy (colonoscopy or flexible sigmoidoscopy):  Excessive amounts of blood in the stool  Significant tenderness or worsening of abdominal pains  Swelling of the abdomen that is new, acute  Fever of 100F or higher   Following upper endoscopy (EGD)  Vomiting of blood or coffee ground material  New chest pain or pain under the shoulder blades  Painful or persistently difficult swallowing  New shortness of breath  Fever of 100F or higher  Black, tarry-looking stools  For urgent or emergent issues, a gastroenterologist can be reached at any hour by calling [phone]  [phone].   DIET:  We do recommend a small meal at first, but then you may proceed to your regular diet.  Drink plenty of fluids but you should avoid alcoholic beverages for 24 hours.  ACTIVITY:  You should plan to take it easy for the rest of today and you should NOT DRIVE or use heavy machinery until tomorrow (because of the sedation medicines used during the test).    FOLLOW UP: Our staff will call the number listed on your records the next business day following your procedure to check on you and address any questions or concerns that you may have regarding the information given to you following your procedure. If we do not reach you, we will leave a message.  However, if you are feeling well and you are not experiencing any problems, there is no need to return our call.  We will assume that you have returned to your regular daily activities without incident.  If any biopsies were taken you will be contacted by phone or by letter within the next 1-3 weeks.  Please call us at [phone] if you have not heard about the biopsies in 3 weeks.    SIGNATURES/CONFIDENTIALITY: You and/or your care partner have signed paperwork which will be entered into your electronic medical record.  These signatures attest to the fact that that the information above on your After Visit Summary has been reviewed and is understood.  Full responsibility of the confidentiality of this discharge information lies with you and/or your care-partner.

## 2017-11-03 NOTE — Op Note (Signed)
[facility] Patient Name: Anna [name] Procedure Date: 11/03/2017 2:32 PM MRN: [id number] Endoscopist: Mauri Pole , MD Age: 51 Referring MD:  Date of Birth: 1966/11/22 Gender: Female Account #: [id number] Procedure:                Upper GI endoscopy Indications:              Dyspepsia, Suspected esophageal reflux Medicines:                Monitored Anesthesia Care Procedure:                Pre-Anesthesia Assessment:                           - Prior to the procedure, a History and Physical                            was performed, and patient medications and                            allergies were reviewed. The patient's tolerance of                            previous anesthesia was also reviewed. The risks                            and benefits of the procedure and the sedation                            options and risks were discussed with the patient.                            All questions were answered, and informed consent                            was obtained. Prior Anticoagulants: The patient has                            taken no previous anticoagulant or antiplatelet                            agents. ASA Grade Assessment: I - A normal, healthy                            patient. After reviewing the risks and benefits,                            the patient was deemed in satisfactory condition to                            undergo the procedure.                           After obtaining informed consent, the endoscope was  passed under direct vision. Throughout the                            procedure, the patient's blood pressure, pulse, and                            oxygen saturations were monitored continuously. The                            Model GIF-[id number]) scope was introduced                            through the mouth, and advanced to the second part                            of duodenum. The upper  GI endoscopy was                            accomplished without difficulty. The patient                            tolerated the procedure well. Scope In: Scope Out: Findings:                 The esophagus was normal.                           The entire examined stomach was normal. Biopsies                            were taken with a cold forceps for Helicobacter                            pylori testing using CLOtest.                           The examined duodenum was normal. Complications:            No immediate complications. Estimated Blood Loss:     Estimated blood loss was minimal. Impression:               - Normal esophagus.                           - Normal stomach. Biopsied.                           - Normal examined duodenum. Recommendation:           - Patient has a contact number available for                            emergencies. The signs and symptoms of potential                            delayed complications were discussed with the  patient. Return to normal activities tomorrow.                            Written discharge instructions were provided to the                            patient.                           - Resume previous diet.                           - Continue present medications.                           - Await pathology results. Mauri Pole, MD 11/03/2017 3:07:32 PM This report has been signed electronically.

## 2017-11-03 NOTE — Progress Notes (Signed)
To recovery, report to RN, VSS. 

## 2017-11-03 NOTE — Op Note (Signed)
[place] Patient Name: Anna [name] Procedure Date: 11/03/2017 2:32 PM MRN: [id number] Endoscopist: Mauri Pole , MD Age: 51 Referring MD:  Date of Birth: Apr 24, 1966 Gender: Female Account #: [id number] Procedure:                Colonoscopy Indications:              Screening for colorectal malignant neoplasm Medicines:                Monitored Anesthesia Care Procedure:                Pre-Anesthesia Assessment:                           - Prior to the procedure, a History and Physical                            was performed, and patient medications and                            allergies were reviewed. The patient's tolerance of                            previous anesthesia was also reviewed. The risks                            and benefits of the procedure and the sedation                            options and risks were discussed with the patient.                            All questions were answered, and informed consent                            was obtained. Prior Anticoagulants: The patient has                            taken no previous anticoagulant or antiplatelet                            agents. ASA Grade Assessment: I - A normal, healthy                            patient. After reviewing the risks and benefits,                            the patient was deemed in satisfactory condition to                            undergo the procedure.                           After obtaining informed consent, the colonoscope  was passed under direct vision. Throughout the                            procedure, the patient's blood pressure, pulse, and                            oxygen saturations were monitored continuously. The                            Colonoscope was introduced through the anus and                            advanced to the the terminal ileum, with                            identification of the appendiceal  orifice and IC                            valve. The colonoscopy was performed without                            difficulty. The patient tolerated the procedure                            well. The quality of the bowel preparation was                            excellent. The ileocecal valve, appendiceal                            orifice, and rectum were photographed. Scope In: 2:47:49 PM Scope Out: 3:00:54 PM Scope Withdrawal Time: 0 hours 6 minutes 55 seconds  Total Procedure Duration: 0 hours 13 minutes 5 seconds  Findings:                 The perianal and digital rectal examinations were                            normal.                           The entire examined colon appeared normal. Complications:            No immediate complications. Estimated Blood Loss:     Estimated blood loss: none. Impression:               - The entire examined colon is normal.                           - No specimens collected. Recommendation:           - Patient has a contact number available for                            emergencies. The signs and symptoms of potential  delayed complications were discussed with the                            patient. Return to normal activities tomorrow.                            Written discharge instructions were provided to the                            patient.                           - Resume previous diet.                           - Continue present medications.                           - Repeat colonoscopy in 10 years for screening                            purposes.                           - Return to GI clinic PRN. Mauri Pole, MD 11/03/2017 3:09:18 PM This report has been signed electronically.

## 2017-11-04 ENCOUNTER — Telehealth: Payer: Self-pay | Admitting: *Deleted

## 2017-11-04 ENCOUNTER — Telehealth: Payer: Self-pay

## 2017-11-04 LAB — HELICOBACTER PYLORI SCREEN-BIOPSY: UREASE: NEGATIVE

## 2017-11-04 NOTE — Telephone Encounter (Signed)
This encounter was made in error. SM

## 2017-11-04 NOTE — Telephone Encounter (Signed)
  Follow up Call-  Call back number 11/03/2017  Post procedure Call Back phone  # [phone]  Permission to leave phone message Yes  Some recent data might be hidden     Left message

## 2017-11-04 NOTE — Telephone Encounter (Signed)
  Follow up Call-  Call back number 11/03/2017  Post procedure Call Back phone  # [phone]  Permission to leave phone message Yes  Some recent data might be hidden     Patient questions:  Do you have a fever, pain , or abdominal swelling? No. Pain Score  0 *  Have you tolerated food without any problems? Yes.    Have you been able to return to your normal activities? Yes.    Do you have any questions about your discharge instructions: Diet   No. Medications  No. Follow up visit  No.  Do you have questions or concerns about your Care? No.  Actions: * If pain score is 4 or above: No action needed, pain <4.

## 2017-11-07 ENCOUNTER — Encounter: Payer: Self-pay | Admitting: Gastroenterology

## 2017-11-11 ENCOUNTER — Ambulatory Visit: Payer: 59 | Admitting: Gastroenterology

## 2018-01-19 ENCOUNTER — Telehealth: Payer: Self-pay | Admitting: *Deleted

## 2018-01-30 NOTE — Telephone Encounter (Signed)
This encounter was entered in error.

## 2018-02-02 ENCOUNTER — Other Ambulatory Visit: Payer: Self-pay | Admitting: Family Medicine

## 2018-02-02 DIAGNOSIS — Z1231 Encounter for screening mammogram for malignant neoplasm of breast: Secondary | ICD-10-CM

## 2018-02-25 ENCOUNTER — Ambulatory Visit
Admission: RE | Admit: 2018-02-25 | Discharge: 2018-02-25 | Disposition: A | Discharge status: Home / Self Care | Payer: 59 | Source: Ambulatory Visit | Attending: Family Medicine | Admitting: Family Medicine

## 2018-02-25 DIAGNOSIS — Z1231 Encounter for screening mammogram for malignant neoplasm of breast: Secondary | ICD-10-CM

## 2018-04-03 ENCOUNTER — Encounter: Payer: Self-pay | Admitting: Gynecology

## 2018-04-03 ENCOUNTER — Ambulatory Visit: Payer: BLUE CROSS/BLUE SHIELD | Admitting: Gynecology

## 2018-04-03 VITALS — BP 114/70 | Ht 64.0 in | Wt 133.0 lb

## 2018-04-03 DIAGNOSIS — Z13220 Encounter for screening for lipoid disorders: Secondary | ICD-10-CM | POA: Diagnosis not present

## 2018-04-03 DIAGNOSIS — Z01419 Encounter for gynecological examination (general) (routine) without abnormal findings: Secondary | ICD-10-CM

## 2018-04-03 LAB — COMPREHENSIVE METABOLIC PANEL
AG Ratio: 1.4 (calc) (ref 1.0–2.5)
ALKALINE PHOSPHATASE (APISO): 51 U/L (ref 33–130)
ALT: 12 U/L (ref 6–29)
AST: 13 U/L (ref 10–35)
Albumin: 3.8 g/dL (ref 3.6–5.1)
BUN: 12 mg/dL (ref 7–25)
CO2: 28 mmol/L (ref 20–32)
CREATININE: 0.69 mg/dL (ref 0.50–1.05)
Calcium: 8.9 mg/dL (ref 8.6–10.4)
Chloride: 105 mmol/L (ref 98–110)
GLUCOSE: 81 mg/dL (ref 65–99)
Globulin: 2.7 g/dL (calc) (ref 1.9–3.7)
Potassium: 4 mmol/L (ref 3.5–5.3)
Sodium: 138 mmol/L (ref 135–146)
Total Bilirubin: 0.6 mg/dL (ref 0.2–1.2)
Total Protein: 6.5 g/dL (ref 6.1–8.1)

## 2018-04-03 LAB — CBC WITH DIFFERENTIAL/PLATELET
BASOS ABS: 38 {cells}/uL (ref 0–200)
BASOS PCT: 0.6 %
Eosinophils Absolute: 202 cells/uL (ref 15–500)
Eosinophils Relative: 3.2 %
HCT: 38.2 % (ref 35.0–45.0)
Hemoglobin: 12.8 g/dL (ref 11.7–15.5)
Lymphs Abs: 1506 cells/uL (ref 850–3900)
MCH: 29.2 pg (ref 27.0–33.0)
MCHC: 33.5 g/dL (ref 32.0–36.0)
MCV: 87 fL (ref 80.0–100.0)
MONOS PCT: 8.2 %
MPV: 8.8 fL (ref 7.5–12.5)
NEUTROS PCT: 64.1 %
Neutro Abs: 4038 cells/uL (ref 1500–7800)
PLATELETS: 370 10*3/uL (ref 140–400)
RBC: 4.39 10*6/uL (ref 3.80–5.10)
RDW: 12.3 % (ref 11.0–15.0)
TOTAL LYMPHOCYTE: 23.9 %
WBC: 6.3 10*3/uL (ref 3.8–10.8)
WBCMIX: 517 {cells}/uL (ref 200–950)

## 2018-04-03 LAB — LIPID PANEL
CHOL/HDL RATIO: 2.9 (calc) (ref <5.0)
CHOLESTEROL: 187 mg/dL (ref <200)
HDL: 64 mg/dL (ref >50)
LDL Cholesterol (Calc): 106 mg/dL (calc) — ABNORMAL HIGH
Non-HDL Cholesterol (Calc): 123 mg/dL (calc) (ref <130)
TRIGLYCERIDES: 76 mg/dL (ref <150)

## 2018-04-03 NOTE — Progress Notes (Signed)
    Anna [name] 07/25/1966 [id number]        51 y.o.  [id number] for annual gynecologic exam.  Doing well from a gynecologic standpoint.  Past medical history,surgical history, problem list, medications, allergies, family history and social history were all reviewed and documented as reviewed in the EPIC chart.  ROS:  Performed with pertinent positives and negatives included in the history, assessment and plan.   Additional significant findings : None   Exam: [name] assistant Vitals:   04/03/18 1109  BP: 114/70  Weight: 133 lb (60.3 kg)  Height: 5\' 4"  (1.626 m)   Body mass index is 22.83 kg/m.  General appearance:  Normal affect, orientation and appearance. Skin: Grossly normal HEENT: Without gross lesions.  No cervical or supraclavicular adenopathy. Thyroid normal.  Lungs:  Clear without wheezing, rales or rhonchi Cardiac: RR, without RMG Abdominal:  Soft, nontender, without masses, guarding, rebound, organomegaly or hernia Breasts:  Examined lying and sitting without masses, retractions, discharge or axillary adenopathy.  Small benign appearing skin tag/mole border of areola left nipple at 7:00. Pelvic:  Ext, BUS, Vagina: Normal.  Pap smear of vaginal cuff  Adnexa: Without masses or tenderness    Anus and perineum: Normal   Rectovaginal: Normal sphincter tone without palpated masses or tenderness.    Assessment/Plan:  52 y.o. [id number] female for annual gynecologic exam status post LAVH 2014 for leiomyoma..   1. Perimenopausal.  No hot flushes night sweats or vaginal dryness.  Continue to monitor and report any significant symptoms. 2. Pap smear 2016.  Pap smear of vaginal cuff done today.  No history of significant abnormal Pap smears.  Options to stop screening per current screening guidelines based on hysterectomy reviewed.  Will readdress on an annual basis. 3. Mammography 02/2018.  Continue with annual mammography next year.  Breast exam normal today.  Asked about  small skin tag/mole 7 o'clock position just outside left areola.  Very benign in appearance.  Recommended observation and if changes as far as color, size or border to follow-up for excision. 4. Health maintenance.  Baseline CBC, CMP and lipid profile ordered.  Follow-up in 1 year, sooner as needed.   Anastasio Auerbach MD, 11:24 AM 04/03/2018

## 2018-04-03 NOTE — Patient Instructions (Signed)
Follow-up in 1 year for annual exam, sooner as needed. 

## 2018-04-03 NOTE — Addendum Note (Signed)
Addended by: Nelva Nay on: 04/03/2018 12:04 PM   Modules accepted: Orders

## 2018-04-06 LAB — PAP IG W/ RFLX HPV ASCU

## 2018-06-30 ENCOUNTER — Encounter: Payer: Self-pay | Admitting: Family Medicine

## 2018-06-30 ENCOUNTER — Ambulatory Visit: Payer: BLUE CROSS/BLUE SHIELD | Admitting: Family Medicine

## 2018-06-30 DIAGNOSIS — N644 Mastodynia: Secondary | ICD-10-CM | POA: Diagnosis not present

## 2018-06-30 NOTE — Assessment & Plan Note (Signed)
In pt with known hx of fibrocystic change Nl exam  Reassuring mammogram in march  Suspect hormonal change with menopause is a factor Disc limiting caffeine/ inc water  Healthy diet  Supportive bra for exercise but not too tight  Minimize trauma  Continue self breast exams and alert if lump or change  Also f/u if no improvement or worsening symptoms

## 2018-06-30 NOTE — Progress Notes (Signed)
Subjective:    Patient ID: Anna [name], female    DOB: 1966-02-14, 52 y.o.   MRN: [id number]  HPI  Here for symptom of breast tenderness  Wt Readings from Last 3 Encounters:  06/30/18 137 lb (62.1 kg)  04/03/18 133 lb (60.3 kg)  11/03/17 135 lb (61.2 kg)   23.52 kg/m    Going on 2 months   Last mammogram 3/19 Exam Information   Status Exam Begun  Exam Ended   Final [99] 02/25/2018 11:24 AM 02/25/2018 11:32 AM  Study Result   CLINICAL DATA:  Screening.  EXAM: DIGITAL SCREENING BILATERAL MAMMOGRAM WITH TOMO AND CAD  COMPARISON:  Previous exam(s).  ACR Breast Density Category d: The breast tissue is extremely dense, which lowers the sensitivity of mammography.  FINDINGS: There are no findings suspicious for malignancy. Images were processed with CAD.  IMPRESSION: No mammographic evidence of malignancy. A result letter of this screening mammogram will be mailed directly to the patient.  RECOMMENDATION: Screening mammogram in one year. (Code:SM-B-01Y)  BI-RADS CATEGORY  1: Negative.   Electronically Signed   By: Abelardo Diesel M.D.   On: 02/25/2018 14:26    Hx of M aunt with breast cancer  No one else   Hysterectomy in 2004  Partial -still has ovaries   No hot flashes or night sweats   No new exercise Wears a supportive bra  Is in body pump - fair amt of push and pull/not more than usual  Has always exercised   Recently married Is sexually active   No breast lumps  Has hx of fibrocystic breasts  Hurts both sides  Some tenderness (when being hugged)  No skin changes No nipple discharge   No caffeine (very little if any)  No herbal supplements   Patient Active Problem List   Diagnosis Date Noted  . Breast tenderness 06/30/2018  . Abdominal pain, generalized 08/13/2017  . Change in stool habits 08/13/2017  . Urticaria 06/11/2016  . Angioedema 06/11/2016  . Lip swelling 05/07/2016  . Lumbar pain 01/02/2016  . Gastritis  11/06/2015  . Folliculitis 35/70/1779  . Allergic rhinitis 03/20/2007  . GERD 03/20/2007   Past Medical History:  Diagnosis Date  . Allergic rhinitis   . 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 Genital warts   . GERD (gastroesophageal reflux disease)   . Hiatal hernia    EGD- gastritis, esophagitis 09/2004  . History of HPV infection   . Knee MCL sprain 2007   Past Surgical History:  Procedure Laterality Date  . LAPAROSCOPIC ASSISTED VAGINAL HYSTERECTOMY N/A 07/27/2013   Procedure: LAPAROSCOPIC ASSISTED VAGINAL HYSTERECTOMY;  Surgeon: Anastasio Auerbach, MD for leiomyomata   Social History   Tobacco Use  . Smoking status: Never Smoker  . Smokeless tobacco: Never Used  Substance Use Topics  . Alcohol use: No    Alcohol/week: 0.0 oz  . Drug use: No   Family History  Problem Relation Age of Onset  . Aneurysm Father        cerebral  . Breast cancer Maternal Aunt        50's  . Cancer Paternal Aunt        Uterine  . Colon cancer Neg Hx   . Rectal cancer Neg Hx   . Esophageal cancer Neg Hx   . Stomach cancer Neg Hx   . Liver cancer Neg Hx    No Known Allergies Current Outpatient Medications on File Prior to Visit  Medication  Sig Dispense Refill  . cetirizine (ZYRTEC) 10 MG tablet Take 10 mg by mouth as needed.     . Cholecalciferol (VITAMIN D PO) Take by mouth.    . diphenhydrAMINE (BENADRYL) 50 MG capsule Take 25 mg by mouth as needed.     . Multiple Vitamin (MULTIVITAMIN) tablet Take 1 tablet by mouth daily.     No current facility-administered medications on file prior to visit.      Review of Systems  Constitutional: Negative for activity change, appetite change, fatigue, fever and unexpected weight change.  HENT: Negative for congestion, ear pain, rhinorrhea, sinus pressure and sore throat.   Eyes: Negative for pain, redness and visual disturbance.  Respiratory: Negative for cough, shortness of breath and wheezing.   Cardiovascular: Negative for chest pain  and palpitations.  Gastrointestinal: Negative for abdominal pain, blood in stool, constipation and diarrhea.  Endocrine: Negative for polydipsia and polyuria.  Genitourinary: Negative for dysuria, frequency and urgency.       Breast tenderness w/o lump or skin change   Musculoskeletal: Negative for arthralgias, back pain, gait problem, joint swelling and myalgias.  Skin: Negative for pallor and rash.  Allergic/Immunologic: Negative for environmental allergies.  Neurological: Negative for dizziness, syncope and headaches.  Hematological: Negative for adenopathy. Does not bruise/bleed easily.  Psychiatric/Behavioral: Negative for decreased concentration and dysphoric mood. The patient is not nervous/anxious.        Objective:   Physical Exam  Constitutional: She appears well-developed and well-nourished. No distress.  Well appearing   HENT:  Head: Normocephalic and atraumatic.  Eyes: Pupils are equal, round, and reactive to light. Conjunctivae and EOM are normal.  Neck: Normal range of motion. Neck supple.  Cardiovascular: Normal rate, regular rhythm and normal heart sounds.  Pulmonary/Chest: Effort normal and breath sounds normal. She has no wheezes. She has no rales.  Genitourinary:  Genitourinary Comments: Breast exam: No mass, nodules, thickening, tenderness, bulging, retraction, inflamation, nipple discharge or skin changes noted.  No axillary or clavicular LA.    Dense breast tissue No tenderness on exam today  Lymphadenopathy:    She has no cervical adenopathy.  Neurological: She is alert. No cranial nerve deficit. Coordination normal.  Skin: Skin is warm and dry. No rash noted. No erythema. No pallor.  Psychiatric:  Pleasant           Assessment & Plan:   Problem List Items Addressed This Visit      Other   Breast tenderness    In pt with known hx of fibrocystic change Nl exam  Reassuring mammogram in march  Suspect hormonal change with menopause is a  factor Disc limiting caffeine/ inc water  Healthy diet  Supportive bra for exercise but not too tight  Minimize trauma  Continue self breast exams and alert if lump or change  Also f/u if no improvement or worsening symptoms

## 2018-06-30 NOTE — Patient Instructions (Addendum)
When you get your annual mammogram- please choose [facility]  Wear a supportive bra - not too loose and not too tight  Make sure you have enough support for exercise and sports Drink lots of water  Avoid caffeine   I think hormonal flux as you approach menopause may make some of your soreness worse   Keep doing self breast exams  Vitamin E over the counter may help also   If symptoms worsen- alert

## 2018-11-05 ENCOUNTER — Ambulatory Visit: Payer: BLUE CROSS/BLUE SHIELD | Admitting: Family Medicine

## 2018-11-05 DIAGNOSIS — Z0289 Encounter for other administrative examinations: Secondary | ICD-10-CM

## 2018-11-09 ENCOUNTER — Encounter: Payer: Self-pay | Admitting: Family Medicine

## 2018-11-09 ENCOUNTER — Ambulatory Visit: Payer: BLUE CROSS/BLUE SHIELD | Admitting: Family Medicine

## 2018-11-09 VITALS — BP 104/66 | HR 63 | Temp 98.1°F | Ht 64.0 in | Wt 138.8 lb

## 2018-11-09 DIAGNOSIS — L989 Disorder of the skin and subcutaneous tissue, unspecified: Secondary | ICD-10-CM | POA: Diagnosis not present

## 2018-11-09 MED ORDER — BETAMETHASONE DIPROPIONATE AUG 0.05 % EX CREA: TOPICAL_CREAM | Freq: Two times a day (BID) | CUTANEOUS | 0 refills | Status: DC

## 2018-11-09 NOTE — Progress Notes (Signed)
Subjective:    Patient ID: Anna [name], female    DOB: 1966/06/14, 52 y.o.   MRN: [id number]  HPI Here concerned about spots on legs   Thinks she was bitten by a bug- 2 spots on her R leg 1 1/2 months  Tried neosporin/ anti itch/hydrocortisone (even px strength that husband has)  One spot is almost gone but still itches   No ticks found    Wt Readings from Last 3 Encounters:  11/09/18 138 lb 12 oz (62.9 kg)  06/30/18 137 lb (62.1 kg)  04/03/18 133 lb (60.3 kg)   23.82 kg/m   Patient Active Problem List   Diagnosis Date Noted  . Skin lesion of right leg 11/09/2018  . Breast tenderness 06/30/2018  . Abdominal pain, generalized 08/13/2017  . Change in stool habits 08/13/2017  . Urticaria 06/11/2016  . Angioedema 06/11/2016  . Lip swelling 05/07/2016  . Lumbar pain 01/02/2016  . Gastritis 11/06/2015  . Folliculitis 69/67/8938  . Allergic rhinitis 03/20/2007  . GERD 03/20/2007   Past Medical History:  Diagnosis Date  . Allergic rhinitis   . 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 Genital warts   . GERD (gastroesophageal reflux disease)   . Hiatal hernia    EGD- gastritis, esophagitis 09/2004  . History of HPV infection   . Knee MCL sprain 2007   Past Surgical History:  Procedure Laterality Date  . LAPAROSCOPIC ASSISTED VAGINAL HYSTERECTOMY N/A 07/27/2013   Procedure: LAPAROSCOPIC ASSISTED VAGINAL HYSTERECTOMY;  Surgeon: Anastasio Auerbach, MD for leiomyomata   Social History   Tobacco Use  . Smoking status: Never Smoker  . Smokeless tobacco: Never Used  Substance Use Topics  . Alcohol use: No    Alcohol/week: 0.0 standard drinks  . Drug use: No   Family History  Problem Relation Age of Onset  . Aneurysm Father        cerebral  . Breast cancer Maternal Aunt        50's  . Cancer Paternal Aunt        Uterine  . Colon cancer Neg Hx   . Rectal cancer Neg Hx   . Esophageal cancer Neg Hx   . Stomach cancer Neg Hx   . Liver cancer Neg Hx    No  Known Allergies Current Outpatient Medications on File Prior to Visit  Medication Sig Dispense Refill  . cetirizine (ZYRTEC) 10 MG tablet Take 10 mg by mouth as needed.     . Cholecalciferol (VITAMIN D PO) Take by mouth.    . diphenhydrAMINE (BENADRYL) 50 MG capsule Take 25 mg by mouth as needed.     . Multiple Vitamin (MULTIVITAMIN) tablet Take 1 tablet by mouth daily.     No current facility-administered medications on file prior to visit.     Review of Systems  Constitutional: Negative for fatigue and fever.  HENT: Negative for rhinorrhea and sore throat.   Eyes: Negative for itching.  Respiratory: Negative for cough and shortness of breath.   Skin: Negative for pallor and rash.       Itchy bumps on legs  Hematological: Negative for adenopathy. Does not bruise/bleed easily.       Objective:   Physical Exam  Constitutional: She appears well-developed and well-nourished. No distress.  Well appearing   HENT:  Head: Normocephalic and atraumatic.  Mouth/Throat: Oropharynx is clear and moist.  Eyes: Pupils are equal, round, and reactive to light. Conjunctivae and EOM are normal. Right  eye exhibits no discharge. Left eye exhibits no discharge. No scleral icterus.  Neck: Normal range of motion. Neck supple.  Cardiovascular: Normal rate, regular rhythm and normal heart sounds.  Pulmonary/Chest: Effort normal and breath sounds normal. No respiratory distress. She has no wheezes.  Lymphadenopathy:    She has no cervical adenopathy.  Neurological: She is alert.  Skin: Skin is warm and dry. No rash noted. No pallor.  Several papules on R lower leg-one near knee (skin colored) , one below knee (0.5 cm ) and hyperpigmented Some excoriations/mild No redness/drainage or signs of infection   Psychiatric: She has a normal mood and affect.          Assessment & Plan:   Problem List Items Addressed This Visit      Musculoskeletal and Integument   Skin lesion of right leg - Primary     Several small papules (hyperpigmented) at site of previous insect bites  tx with diprolene cream 0.05% bid  Soap and water  Watch for redness/signs of infection  Update if not starting to improve in a week or if worsening

## 2018-11-09 NOTE — Assessment & Plan Note (Signed)
Several small papules (hyperpigmented) at site of previous insect bites  tx with diprolene cream 0.05% bid  Soap and water  Watch for redness/signs of infection  Update if not starting to improve in a week or if worsening

## 2018-11-09 NOTE — Patient Instructions (Addendum)
I think these are old bug bites - (perhaps from [place])   Keep clean with soap and water Try the diprolene cream twice daily to both spots  Update if not starting to improve in a week or if worsening    Also antihistamine may help  Cold compress helps itching also

## 2018-12-03 ENCOUNTER — Ambulatory Visit: Payer: BLUE CROSS/BLUE SHIELD | Admitting: Family Medicine

## 2018-12-03 ENCOUNTER — Encounter: Payer: Self-pay | Admitting: Family Medicine

## 2018-12-03 ENCOUNTER — Ambulatory Visit (INDEPENDENT_AMBULATORY_CARE_PROVIDER_SITE_OTHER)
Admission: RE | Admit: 2018-12-03 | Discharge: 2018-12-03 | Disposition: A | Discharge status: Home / Self Care | Payer: BLUE CROSS/BLUE SHIELD | Source: Ambulatory Visit | Attending: Family Medicine | Admitting: Family Medicine

## 2018-12-03 ENCOUNTER — Telehealth: Payer: Self-pay | Admitting: *Deleted

## 2018-12-03 VITALS — BP 106/64 | HR 68 | Temp 98.1°F | Ht 64.0 in | Wt 135.5 lb

## 2018-12-03 DIAGNOSIS — M545 Low back pain, unspecified: Secondary | ICD-10-CM

## 2018-12-03 DIAGNOSIS — M79605 Pain in left leg: Secondary | ICD-10-CM

## 2018-12-03 DIAGNOSIS — M25562 Pain in left knee: Secondary | ICD-10-CM

## 2018-12-03 NOTE — Assessment & Plan Note (Signed)
Outer hip / a little in back and buttock -rad to knee (with stair climbing)  Xray LS  Info on IT bursitis and rehab exercises Ice to area Ibuprofen prn  Consider PT -pend rad rev

## 2018-12-03 NOTE — Telephone Encounter (Signed)
Left VM requesting pt to call office back regarding xray results

## 2018-12-03 NOTE — Assessment & Plan Note (Signed)
Rad to lower abd and ? L leg Also IT symptoms Xray today  Reassuring exam and nl rom

## 2018-12-03 NOTE — Progress Notes (Signed)
Subjective:    Patient ID: Anna [name], female    DOB: [birth year], 52 y.o.   MRN: [id number]  HPI 52 yo pt here for abdominal and leg pain   Pain in L outer hip and knee when she walks up the steps When sitting her outer hip /groin hurts  Some clicking and popping   occ low back to low abdomen - crampy feeling   Goes to the gym  Does body pump classes and squats and lunges  Does hip abductor machines at gym   No medicines  Unsure if tolerates nsaids (GI) Can take tylenol - has not taken it  Has not used heat or ice   Patient Active Problem List   Diagnosis Date Noted  . Leg pain, lateral, left 12/03/2018  . Left knee pain 12/03/2018  . Skin lesion of right leg 11/09/2018  . Breast tenderness 06/30/2018  . Abdominal pain, generalized 08/13/2017  . Change in stool habits 08/13/2017  . Urticaria 06/11/2016  . Angioedema 06/11/2016  . Lip swelling 05/07/2016  . Lumbar pain 01/02/2016  . Gastritis 11/06/2015  . Folliculitis 14/97/0263  . Allergic rhinitis 03/20/2007  . GERD 03/20/2007   Past Medical History:  Diagnosis Date  . Allergic rhinitis   . 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 Genital warts   . GERD (gastroesophageal reflux disease)   . Hiatal hernia    EGD- gastritis, esophagitis 09/2004  . History of HPV infection   . Knee MCL sprain 2007   Past Surgical History:  Procedure Laterality Date  . LAPAROSCOPIC ASSISTED VAGINAL HYSTERECTOMY N/A 07/27/2013   Procedure: LAPAROSCOPIC ASSISTED VAGINAL HYSTERECTOMY;  Surgeon: Anastasio Auerbach, MD for leiomyomata   Social History   Tobacco Use  . Smoking status: Never Smoker  . Smokeless tobacco: Never Used  Substance Use Topics  . Alcohol use: No    Alcohol/week: 0.0 standard drinks  . Drug use: No   Family History  Problem Relation Age of Onset  . Aneurysm Father        cerebral  . Breast cancer Maternal Aunt        50's  . Cancer Paternal Aunt        Uterine  . Colon cancer Neg Hx   .  Rectal cancer Neg Hx   . Esophageal cancer Neg Hx   . Stomach cancer Neg Hx   . Liver cancer Neg Hx    No Known Allergies Current Outpatient Medications on File Prior to Visit  Medication Sig Dispense Refill  . augmented betamethasone dipropionate (DIPROLENE-AF) 0.05 % cream Apply topically 2 (two) times daily. To affected areas (that itch) 15 g 0  . BLACK CURRANT SEED OIL PO Take 1 capsule by mouth daily.    Marland Kitchen CALCIUM PO Take 2 capsules by mouth daily.    . cetirizine (ZYRTEC) 10 MG tablet Take 10 mg by mouth as needed.     . Cholecalciferol (VITAMIN D PO) Take by mouth.    . diphenhydrAMINE (BENADRYL) 50 MG capsule Take 25 mg by mouth as needed.     . Flaxseed, Linseed, (FLAX SEEDS PO) Take 1 capsule by mouth daily.    . Multiple Vitamin (MULTIVITAMIN) tablet Take 1 tablet by mouth daily.     No current facility-administered medications on file prior to visit.     Review of Systems  Constitutional: Negative for activity change, appetite change, fatigue, fever and unexpected weight change.  HENT: Negative for congestion, ear pain,  rhinorrhea, sinus pressure and sore throat.   Eyes: Negative for pain, redness and visual disturbance.  Respiratory: Negative for cough, shortness of breath and wheezing.   Cardiovascular: Negative for chest pain and palpitations.  Gastrointestinal: Negative for abdominal pain, blood in stool, constipation and diarrhea.  Endocrine: Negative for polydipsia and polyuria.  Genitourinary: Negative for dysuria, frequency and urgency.       Low abd/pelvic pain -? If from back or hip  Musculoskeletal: Positive for arthralgias and back pain. Negative for gait problem, joint swelling and myalgias.       Pain in L outer hip/leg/knee   Skin: Negative for pallor and rash.  Allergic/Immunologic: Negative for environmental allergies.  Neurological: Negative for dizziness, syncope and headaches.  Hematological: Negative for adenopathy. Does not bruise/bleed easily.    Psychiatric/Behavioral: Negative for decreased concentration and dysphoric mood. The patient is not nervous/anxious.        Objective:   Physical Exam Constitutional:      General: She is not in acute distress.    Appearance: She is well-developed.     Comments: Well appearing   HENT:     Head: Normocephalic and atraumatic.  Eyes:     General: No scleral icterus.    Conjunctiva/sclera: Conjunctivae normal.     Pupils: Pupils are equal, round, and reactive to light.  Neck:     Musculoskeletal: Normal range of motion and neck supple.  Cardiovascular:     Rate and Rhythm: Normal rate and regular rhythm.  Pulmonary:     Effort: Pulmonary effort is normal.     Breath sounds: Normal breath sounds. No wheezing or rales.  Abdominal:     General: Bowel sounds are normal. There is no distension.     Palpations: Abdomen is soft.     Tenderness: There is no abdominal tenderness.  Musculoskeletal:        General: Tenderness present.     Right shoulder: She exhibits normal range of motion, no tenderness, no bony tenderness, no crepitus, no spasm, normal pulse and normal strength.     Left hip: She exhibits tenderness. She exhibits normal range of motion, normal strength, no bony tenderness, no swelling, no crepitus and no deformity.     Lumbar back: She exhibits decreased range of motion, tenderness and spasm. She exhibits no bony tenderness and no edema.     Comments: Some L lateral lumbar muscle tenderness  Nl rom of spine   Nl rom of L hip  Neg SLR Some mild tenderness over L greater trochanter  Tight IT band also  Nl gait   Nl rom L knee without tenderness or swelling or eff  Lymphadenopathy:     Cervical: No cervical adenopathy.  Skin:    General: Skin is warm and dry.     Coloration: Skin is not pale.     Findings: No erythema or rash.  Neurological:     Mental Status: She is alert.     Cranial Nerves: No cranial nerve deficit.     Sensory: No sensory deficit.      Motor: No atrophy or abnormal muscle tone.     Coordination: Coordination normal.     Deep Tendon Reflexes: Reflexes are normal and symmetric.     Comments: Negative SLR  Psychiatric:        Mood and Affect: Mood normal.           Assessment & Plan:   Problem List Items Addressed This Visit  Other   Lumbar pain - Primary    Rad to lower abd and ? L leg Also IT symptoms Xray today  Reassuring exam and nl rom       Relevant Orders   DG Lumbar Spine Complete (Completed)   Leg pain, lateral, left    Outer hip / a little in back and buttock -rad to knee (with stair climbing)  Xray LS  Info on IT bursitis and rehab exercises Ice to area Ibuprofen prn  Consider PT -pend rad rev      Relevant Orders   DG Lumbar Spine Complete (Completed)   Left knee pain    ? Related to upper lateral hip/leg pain  Suspect IT related (worse with stairs) Also some lumbar pain -? If related  Given info on IT bursitis as well as rehab exercises otc ibuprofen as tol Ice to affected area  May consider PT

## 2018-12-03 NOTE — Assessment & Plan Note (Signed)
?   Related to upper lateral hip/leg pain  Suspect IT related (worse with stairs) Also some lumbar pain -? If related  Given info on IT bursitis as well as rehab exercises otc ibuprofen as tol Ice to affected area  May consider PT

## 2018-12-03 NOTE — Patient Instructions (Addendum)
You may have some bursitis of hip or IT band issues Also a low back injury can also shoot pain down leg   Lets get a lumbar xray   Try 2 advil (200 mg ibuprofen) with a meal up to every 8 hours If it bothers your stomach-stop it   Try some ice on outer hip  Heat on low back  Both 10 minutes at a time  Avoid exercises that make symptoms worse   Try the exercises on handout   We may refer you to physical therapy  Let's see how xray looks first

## 2019-02-23 ENCOUNTER — Other Ambulatory Visit: Payer: Self-pay | Admitting: Family Medicine

## 2019-02-23 DIAGNOSIS — Z1231 Encounter for screening mammogram for malignant neoplasm of breast: Secondary | ICD-10-CM

## 2019-03-26 ENCOUNTER — Ambulatory Visit: Payer: BLUE CROSS/BLUE SHIELD

## 2019-04-12 ENCOUNTER — Encounter: Payer: Self-pay | Admitting: Family Medicine

## 2019-04-12 ENCOUNTER — Ambulatory Visit: Payer: BLUE CROSS/BLUE SHIELD | Admitting: Family Medicine

## 2019-04-12 ENCOUNTER — Other Ambulatory Visit: Payer: Self-pay

## 2019-04-12 VITALS — BP 108/62 | HR 70 | Temp 98.2°F | Ht 64.0 in | Wt 134.1 lb

## 2019-04-12 DIAGNOSIS — K2900 Acute gastritis without bleeding: Secondary | ICD-10-CM | POA: Diagnosis not present

## 2019-04-12 DIAGNOSIS — K219 Gastro-esophageal reflux disease without esophagitis: Secondary | ICD-10-CM

## 2019-04-12 MED ORDER — OMEPRAZOLE 20 MG PO CPDR: 20.0000 mg | DELAYED_RELEASE_CAPSULE | Freq: Every day | ORAL | 5 refills | Status: DC

## 2019-04-12 NOTE — Patient Instructions (Addendum)
Take omeprazole 20 mg once daily  First dose now  Then each am about 20 minutes before breakfast   Watch your diet for spice/ tomatoes /anything acid  Avoid caffeine and especially coffee   If something bothers you - avoid it   Update if not starting to improve in a week or if worsening    Stool was negative today

## 2019-04-12 NOTE — Assessment & Plan Note (Signed)
Acid reflux symptoms along with general stomach upset and nausea  Also suspect recurrent gastritis Disc diet/what to avoid and handout given  Px for omeprazole 20 mg daily in am  Update if not starting to improve in a week or if worsening

## 2019-04-12 NOTE — Assessment & Plan Note (Signed)
Suspect this (along with gerd) is source of abd pain  More stress lately  Doubt blood in stool (heme neg today)  Trial of omeprazole (has worked well in the past) Avoid nsaids Diet ref Update if not starting to improve in a week or if worsening

## 2019-04-12 NOTE — Progress Notes (Signed)
Subjective:    Patient ID: Anna [name], female    DOB: 20-Nov-1966, 53 y.o.   MRN: [id number]  HPI Here for abdominal pain and blood in stool   Wt Readings from Last 3 Encounters:  04/12/19 134 lb 2 oz (60.8 kg)  12/03/18 135 lb 8 oz (61.5 kg)  11/09/18 138 lb 12 oz (62.9 kg)   23.02 kg/m   She had a normal colonoscopy 11/18   Cramping on and off for a while  Nausea for over a week  Constant pain from throat to lower abdomen in a line   Saw red material in stool in the toilet but not the paper  Has not eaten anything red  No dark black stool  Pain is achy -not burning  Has had acid reflux symptoms- burnin gin her throat   Had diarrhea once only  Usually bm once per day and she does not strain   No hx of IBD in the family   Diet has changed a bit working at home (has not eaten today)   She tried alka selzer-helped some  Has omeprazole at home-has not taken   Patient Active Problem List   Diagnosis Date Noted  . Leg pain, lateral, left 12/03/2018  . Left knee pain 12/03/2018  . Skin lesion of right leg 11/09/2018  . Breast tenderness 06/30/2018  . Abdominal pain, generalized 08/13/2017  . Change in stool habits 08/13/2017  . Urticaria 06/11/2016  . Angioedema 06/11/2016  . Lip swelling 05/07/2016  . Lumbar pain 01/02/2016  . Gastritis 11/06/2015  . Folliculitis 52/77/8242  . Allergic rhinitis 03/20/2007  . GERD 03/20/2007   Past Medical History:  Diagnosis Date  . Allergic rhinitis   . 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 Genital warts   . GERD (gastroesophageal reflux disease)   . Hiatal hernia    EGD- gastritis, esophagitis 09/2004  . History of HPV infection   . Knee MCL sprain 2007   Past Surgical History:  Procedure Laterality Date  . LAPAROSCOPIC ASSISTED VAGINAL HYSTERECTOMY N/A 07/27/2013   Procedure: LAPAROSCOPIC ASSISTED VAGINAL HYSTERECTOMY;  Surgeon: Anastasio Auerbach, MD for leiomyomata   Social History   Tobacco Use  .  Smoking status: Never Smoker  . Smokeless tobacco: Never Used  Substance Use Topics  . Alcohol use: No    Alcohol/week: 0.0 standard drinks  . Drug use: No   Family History  Problem Relation Age of Onset  . Aneurysm Father        cerebral  . Breast cancer Maternal Aunt        50's  . Cancer Paternal Aunt        Uterine  . Colon cancer Neg Hx   . Rectal cancer Neg Hx   . Esophageal cancer Neg Hx   . Stomach cancer Neg Hx   . Liver cancer Neg Hx    No Known Allergies Current Outpatient Medications on File Prior to Visit  Medication Sig Dispense Refill  . BLACK CURRANT SEED OIL PO Take 1 capsule by mouth daily.    Marland Kitchen CALCIUM PO Take 2 capsules by mouth daily.    . cetirizine (ZYRTEC) 10 MG tablet Take 10 mg by mouth as needed.     . Cholecalciferol (VITAMIN D3 PO) Take 1 capsule by mouth daily.    . diphenhydrAMINE (BENADRYL) 50 MG capsule Take 25 mg by mouth as needed.     . Flaxseed, Linseed, (FLAX SEEDS PO) Take 1  capsule by mouth daily.    . Multiple Vitamin (MULTIVITAMIN) tablet Take 1 tablet by mouth daily.     No current facility-administered medications on file prior to visit.      Review of Systems  Constitutional: Negative for activity change, appetite change, fatigue, fever and unexpected weight change.  HENT: Negative for congestion, ear pain, rhinorrhea, sinus pressure, sore throat and trouble swallowing.   Eyes: Negative for pain, redness and visual disturbance.  Respiratory: Negative for cough, shortness of breath and wheezing.   Cardiovascular: Negative for chest pain and palpitations.  Gastrointestinal: Positive for abdominal pain, blood in stool and nausea. Negative for abdominal distention, anal bleeding, constipation, diarrhea, rectal pain and vomiting.       Red material in stool ? Blood  None on paper  Was eating beets in a smoothie  Endocrine: Negative for polydipsia and polyuria.  Genitourinary: Negative for dysuria, flank pain, frequency and  urgency.  Musculoskeletal: Negative for arthralgias, back pain and myalgias.  Skin: Negative for pallor and rash.  Allergic/Immunologic: Negative for environmental allergies.  Neurological: Negative for dizziness, syncope, light-headedness and headaches.  Hematological: Negative for adenopathy. Does not bruise/bleed easily.  Psychiatric/Behavioral: Negative for decreased concentration and dysphoric mood. The patient is not nervous/anxious.        Objective:   Physical Exam Constitutional:      General: She is not in acute distress.    Appearance: She is well-developed and normal weight. She is not ill-appearing or diaphoretic.  HENT:     Head: Normocephalic and atraumatic.     Mouth/Throat:     Mouth: Mucous membranes are moist.     Pharynx: Oropharynx is clear.  Eyes:     General: No scleral icterus.    Conjunctiva/sclera: Conjunctivae normal.     Pupils: Pupils are equal, round, and reactive to light.  Neck:     Musculoskeletal: Normal range of motion and neck supple.  Cardiovascular:     Rate and Rhythm: Normal rate and regular rhythm.     Heart sounds: Normal heart sounds.  Pulmonary:     Effort: Pulmonary effort is normal. No respiratory distress.     Breath sounds: Normal breath sounds. No wheezing or rales.  Abdominal:     General: Bowel sounds are normal. There is no distension.     Palpations: Abdomen is soft. There is shifting dullness. There is no hepatomegaly, splenomegaly, mass or pulsatile mass.     Tenderness: There is abdominal tenderness in the epigastric area. There is no right CVA tenderness, left CVA tenderness, guarding or rebound. Negative signs include Murphy's sign and McBurney's sign.     Comments: Mild epigastric tenderness No rebound or guarding No pain with movement of table or pt   Genitourinary:    Rectum: Normal. Guaiac result negative.     Comments: Normal rectal exam with heme neg stool Lymphadenopathy:     Cervical: No cervical adenopathy.   Skin:    General: Skin is warm and dry.     Coloration: Skin is not pale.     Findings: No erythema.  Neurological:     Mental Status: She is alert. Mental status is at baseline.  Psychiatric:        Mood and Affect: Mood normal.           Assessment & Plan:   Problem List Items Addressed This Visit      Digestive   GERD    Acid reflux symptoms along with general stomach  upset and nausea  Also suspect recurrent gastritis Disc diet/what to avoid and handout given  Px for omeprazole 20 mg daily in am  Update if not starting to improve in a week or if worsening        Relevant Medications   omeprazole (PRILOSEC) 20 MG capsule   Gastritis - Primary    Suspect this (along with gerd) is source of abd pain  More stress lately  Doubt blood in stool (heme neg today)  Trial of omeprazole (has worked well in the past) Avoid nsaids Diet ref Update if not starting to improve in a week or if worsening

## 2019-04-14 ENCOUNTER — Other Ambulatory Visit: Payer: Self-pay

## 2019-04-16 ENCOUNTER — Encounter: Payer: BLUE CROSS/BLUE SHIELD | Admitting: Gynecology

## 2019-05-12 ENCOUNTER — Ambulatory Visit: Payer: BLUE CROSS/BLUE SHIELD

## 2019-05-14 ENCOUNTER — Ambulatory Visit: Payer: BLUE CROSS/BLUE SHIELD

## 2019-05-20 ENCOUNTER — Encounter: Payer: BLUE CROSS/BLUE SHIELD | Admitting: Gynecology

## 2019-06-29 ENCOUNTER — Ambulatory Visit
Admission: RE | Admit: 2019-06-29 | Discharge: 2019-06-29 | Disposition: A | Discharge status: Home / Self Care | Payer: BC Managed Care – PPO | Source: Ambulatory Visit | Attending: Family Medicine | Admitting: Family Medicine

## 2019-06-29 ENCOUNTER — Other Ambulatory Visit: Payer: Self-pay

## 2019-06-29 DIAGNOSIS — Z1231 Encounter for screening mammogram for malignant neoplasm of breast: Secondary | ICD-10-CM | POA: Diagnosis not present

## 2019-07-01 ENCOUNTER — Encounter: Payer: BLUE CROSS/BLUE SHIELD | Admitting: Gynecology

## 2019-07-05 ENCOUNTER — Other Ambulatory Visit: Payer: Self-pay

## 2019-07-06 ENCOUNTER — Encounter: Payer: Self-pay | Admitting: Gynecology

## 2019-07-06 ENCOUNTER — Ambulatory Visit: Payer: BC Managed Care – PPO | Admitting: Gynecology

## 2019-07-06 VITALS — BP 118/76 | Ht 64.0 in | Wt 138.0 lb

## 2019-07-06 DIAGNOSIS — N898 Other specified noninflammatory disorders of vagina: Secondary | ICD-10-CM

## 2019-07-06 DIAGNOSIS — Z01419 Encounter for gynecological examination (general) (routine) without abnormal findings: Secondary | ICD-10-CM

## 2019-07-06 DIAGNOSIS — Z13220 Encounter for screening for lipoid disorders: Secondary | ICD-10-CM

## 2019-07-06 NOTE — Patient Instructions (Signed)
Take the one Diflucan pill that was prescribed to treat a possible yeast infection.  Follow-up if the vaginal itching continues.  Follow-up in 1 year for annual exam.

## 2019-07-06 NOTE — Progress Notes (Signed)
    Anna [name] [birth year] [id number]        52 y.o.  [id number] for annual gynecologic exam.  Notes a slight vaginal itching on and off.  No discharge or odor.  No urinary symptoms such as frequency dysuria urgency low back pain fever or chills.  Past medical history,surgical history, problem list, medications, allergies, family history and social history were all reviewed and documented as reviewed in the EPIC chart.  ROS:  Performed with pertinent positives and negatives included in the history, assessment and plan.   Additional significant findings : None   Exam: [name] assistant Vitals:   07/06/19 1631  BP: 118/76  Weight: 138 lb (62.6 kg)  Height: 5\' 4"  (1.626 m)   Body mass index is 23.69 kg/m.  General appearance:  Normal affect, orientation and appearance. Skin: Grossly normal HEENT: Without gross lesions.  No cervical or supraclavicular adenopathy. Thyroid normal.  Lungs:  Clear without wheezing, rales or rhonchi Cardiac: RR, without RMG Abdominal:  Soft, nontender, without masses, guarding, rebound, organomegaly or hernia Breasts:  Examined lying and sitting without masses, retractions, discharge or axillary adenopathy. Pelvic:  Ext, BUS, Vagina: Normal with slight vaginal discharge  Adnexa: Without masses or tenderness    Anus and perineum: Normal   Rectovaginal: Normal sphincter tone without palpated masses or tenderness.    Assessment/Plan:  53 y.o. [id number] female for annual gynecologic exam.  Status post LAVH 2014 for leiomyoma  1. Perimenopausal.  No significant hot flashes, night sweats or vaginal dryness. 2. Vaginal itching.  Wet prep is negative.  I am going to cover her for low-grade yeast given the symptoms with Diflucan 150 mg x 1 dose.  She will call if her symptoms continue. 3. Pap smear 2019.  No Pap smear done today.  No history of significant abnormal Pap smears.  Options to stop screening per current screening guidelines based on  hysterectomy history versus less frequent screening intervals and repeat in 3 years reviewed.  Will readdress on an annual basis. 4. Mammography 06/2019.  Continue with annual mammography next year.  Breast exam normal today. 5. Colonoscopy 2018.  Repeat at their recommended interval. 6. Health maintenance.  Requests baseline labs.  CBC, CMP and lipid profile ordered.  Follow-up 1 year, sooner as needed.   Anastasio Auerbach MD, 4:55 PM 07/06/2019      Anna [name]

## 2019-07-07 LAB — WET PREP FOR TRICH, YEAST, CLUE

## 2019-07-08 LAB — LIPID PANEL
Cholesterol: 195 mg/dL (ref <200)
HDL: 72 mg/dL (ref >=50)
LDL Cholesterol (Calc): 105 mg/dL (calc) — ABNORMAL HIGH
Non-HDL Cholesterol (Calc): 123 mg/dL (calc) (ref <130)
Total CHOL/HDL Ratio: 2.7 (calc) (ref <5.0)
Triglycerides: 89 mg/dL (ref <150)

## 2019-07-08 LAB — CBC WITH DIFFERENTIAL/PLATELET
Absolute Monocytes: 350 cells/uL (ref 200–950)
Basophils Absolute: 53 cells/uL (ref 0–200)
Basophils Relative: 0.8 %
Eosinophils Absolute: 198 cells/uL (ref 15–500)
Eosinophils Relative: 3 %
HCT: 40.3 % (ref 35.0–45.0)
Hemoglobin: 12.9 g/dL (ref 11.7–15.5)
Lymphs Abs: 2046 cells/uL (ref 850–3900)
MCH: 29.3 pg (ref 27.0–33.0)
MCHC: 32 g/dL (ref 32.0–36.0)
MCV: 91.4 fL (ref 80.0–100.0)
MPV: 9 fL (ref 7.5–12.5)
Monocytes Relative: 5.3 %
Neutro Abs: 3953 cells/uL (ref 1500–7800)
Neutrophils Relative %: 59.9 %
Platelets: 332 10*3/uL (ref 140–400)
RBC: 4.41 10*6/uL (ref 3.80–5.10)
RDW: 13 % (ref 11.0–15.0)
Total Lymphocyte: 31 %
WBC: 6.6 10*3/uL (ref 3.8–10.8)

## 2019-07-08 LAB — COMPREHENSIVE METABOLIC PANEL
AG Ratio: 1.5 (calc) (ref 1.0–2.5)
ALT: 14 U/L (ref 6–29)
AST: 11 U/L (ref 10–35)
Albumin: 4 g/dL (ref 3.6–5.1)
Alkaline phosphatase (APISO): 54 U/L (ref 37–153)
BUN: 18 mg/dL (ref 7–25)
CO2: 23 mmol/L (ref 20–32)
Calcium: 9.1 mg/dL (ref 8.6–10.4)
Chloride: 105 mmol/L (ref 98–110)
Creat: 0.9 mg/dL (ref 0.50–1.05)
Globulin: 2.6 g/dL (calc) (ref 1.9–3.7)
Glucose, Bld: 87 mg/dL (ref 65–99)
Potassium: 3.8 mmol/L (ref 3.5–5.3)
Sodium: 137 mmol/L (ref 135–146)
Total Bilirubin: 0.5 mg/dL (ref 0.2–1.2)
Total Protein: 6.6 g/dL (ref 6.1–8.1)

## 2019-07-26 ENCOUNTER — Other Ambulatory Visit: Payer: Self-pay

## 2019-07-26 ENCOUNTER — Telehealth: Payer: Self-pay | Admitting: Family Medicine

## 2019-07-26 DIAGNOSIS — Z20822 Contact with and (suspected) exposure to covid-19: Secondary | ICD-10-CM

## 2019-07-26 DIAGNOSIS — Z20828 Contact with and (suspected) exposure to other viral communicable diseases: Secondary | ICD-10-CM

## 2019-07-26 DIAGNOSIS — R6889 Other general symptoms and signs: Secondary | ICD-10-CM | POA: Diagnosis not present

## 2019-07-26 NOTE — Telephone Encounter (Signed)
Pt called to request covid testing. She has a coworker that she was in contact with on 7/31 that was confirmed positive. Both employees were wearing masks. Pt is not having any symptoms.

## 2019-07-26 NOTE — Telephone Encounter (Signed)
Test ordered Please call her to set it up

## 2019-07-26 NOTE — Telephone Encounter (Signed)
Testing address and info given to pt

## 2019-07-28 LAB — NOVEL CORONAVIRUS, NAA: SARS-CoV-2, NAA: NOT DETECTED

## 2019-08-17 ENCOUNTER — Ambulatory Visit (INDEPENDENT_AMBULATORY_CARE_PROVIDER_SITE_OTHER)
Admission: RE | Admit: 2019-08-17 | Discharge: 2019-08-17 | Disposition: A | Discharge status: Home / Self Care | Payer: BC Managed Care – PPO | Source: Ambulatory Visit | Attending: Family Medicine | Admitting: Family Medicine

## 2019-08-17 ENCOUNTER — Ambulatory Visit (INDEPENDENT_AMBULATORY_CARE_PROVIDER_SITE_OTHER): Payer: BC Managed Care – PPO | Admitting: Family Medicine

## 2019-08-17 ENCOUNTER — Encounter: Payer: Self-pay | Admitting: Family Medicine

## 2019-08-17 ENCOUNTER — Other Ambulatory Visit: Payer: Self-pay

## 2019-08-17 DIAGNOSIS — M79672 Pain in left foot: Secondary | ICD-10-CM

## 2019-08-17 NOTE — Progress Notes (Signed)
Subjective:    Patient ID: Anna [name], female    DOB: [birth year], 53 y.o.   MRN: [id number]  HPI Here for L foot pain   Burning sensation on top of her foot  All day  For about a month  Started before her ankle brace  No trauma  No rash or blisters  Just one spot -no radiation  occ that foot swells a bit - feels heavier  No back problems at all   This [name] and goes  Lasts about 15 minutes  Not in bed  Has happened when she is doing nothing   Sits at computer  Not a lot of back support-working on new seating for that  Does not get up as much with virtual work     Has a tan line - does not remember a bad sunburn   She stopped wearing tight no show socks for a while- the edge ran over that spot Stopped wearing them a week ago     Twisted her L ankle a month ago Has been wearing a brace  Uses a pull on neoprene brace to walk     Last LS film 12/19 LUMBAR SPINE - COMPLETE 4+ VIEW  COMPARISON:  January 02, 2016  FINDINGS: Frontal, lateral, spot lumbosacral lateral, and bilateral oblique views were obtained. There are 5 non-rib-bearing lumbar type vertebral bodies. There is no fracture or spondylolisthesis. The disc spaces appear normal. There is facet osteoarthritic change on the left at L4-5 and L5-S1.  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 No disc space narrowing. No fracture or spondylolisthesis.  Patient Active Problem List   Diagnosis Date Noted  . Left foot pain 08/17/2019  . Exposure to Covid-19 Virus 07/26/2019  . Leg pain, lateral, left 12/03/2018  . Left knee pain 12/03/2018  . Skin lesion of right leg 11/09/2018  . Breast tenderness 06/30/2018  . Abdominal pain, generalized 08/13/2017  . Change in stool habits 08/13/2017  . Urticaria 06/11/2016  . Angioedema 06/11/2016  . Lip swelling 05/07/2016  . Lumbar pain 01/02/2016  . Gastritis 11/06/2015  . Folliculitis [id number]  . Allergic  rhinitis 03/20/2007  . GERD 03/20/2007   Past Medical History:  Diagnosis Date  . Allergic rhinitis   . 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 Genital warts   . GERD (gastroesophageal reflux disease)   . Hiatal hernia    EGD- gastritis, esophagitis 09/2004  . History of HPV infection   . Knee MCL sprain 2007   Past Surgical History:  Procedure Laterality Date  . LAPAROSCOPIC ASSISTED VAGINAL HYSTERECTOMY N/A 07/27/2013   Procedure: LAPAROSCOPIC ASSISTED VAGINAL HYSTERECTOMY;  Surgeon: Anastasio Auerbach, MD for leiomyomata   Social History   Tobacco Use  . Smoking status: Never Smoker  . Smokeless tobacco: Never Used  Substance Use Topics  . Alcohol use: No    Alcohol/week: 0.0 standard drinks  . Drug use: No   Family History  Problem Relation Age of Onset  . Aneurysm Father        cerebral  . Breast cancer Maternal Aunt        50's  . Cancer Paternal Aunt        Uterine  . Colon cancer Neg Hx   . Rectal cancer Neg Hx   . Esophageal cancer Neg Hx   . Stomach cancer Neg Hx   . Liver cancer Neg Hx    No Known Allergies Current Outpatient  Medications on File Prior to Visit  Medication Sig Dispense Refill  . BLACK CURRANT SEED OIL PO Take 1 capsule by mouth daily.    Marland Kitchen CALCIUM PO Take 2 capsules by mouth daily.    . cetirizine (ZYRTEC) 10 MG tablet Take 10 mg by mouth as needed.     . Cholecalciferol (VITAMIN D3 PO) Take 1 capsule by mouth daily.    . diphenhydrAMINE (BENADRYL) 50 MG capsule Take 25 mg by mouth as needed.     . Multiple Vitamin (MULTIVITAMIN) tablet Take 1 tablet by mouth daily.    Marland Kitchen omeprazole (PRILOSEC) 20 MG capsule Take 1 capsule (20 mg total) by mouth daily. (Patient taking differently: Take 20 mg by mouth daily as needed. ) 30 capsule 5   No current facility-administered medications on file prior to visit.      Review of Systems  Constitutional: Negative for activity change, appetite change, fatigue, fever and unexpected weight change.   HENT: Negative for congestion, ear pain, rhinorrhea, sinus pressure and sore throat.   Eyes: Negative for pain, redness and visual disturbance.  Respiratory: Negative for cough, shortness of breath and wheezing.   Cardiovascular: Negative for chest pain and palpitations.  Gastrointestinal: Negative for abdominal pain, blood in stool, constipation and diarrhea.  Endocrine: Negative for polydipsia and polyuria.  Genitourinary: Negative for dysuria, frequency and urgency.  Musculoskeletal: Negative for arthralgias, back pain and myalgias.  Skin: Negative for pallor and rash.  Allergic/Immunologic: Negative for environmental allergies.  Neurological: Negative for dizziness, syncope, weakness, numbness and headaches.  Hematological: Negative for adenopathy. Does not bruise/bleed easily.  Psychiatric/Behavioral: Negative for decreased concentration and dysphoric mood. The patient is not nervous/anxious.        Objective:   Physical Exam Constitutional:      General: She is not in acute distress.    Appearance: Normal appearance. She is normal weight. She is not ill-appearing.  Eyes:     General: No scleral icterus.    Conjunctiva/sclera: Conjunctivae normal.     Pupils: Pupils are equal, round, and reactive to light.  Cardiovascular:     Rate and Rhythm: Normal rate and regular rhythm.  Pulmonary:     Effort: Pulmonary effort is normal. No respiratory distress.  Musculoskeletal:     Right lower leg: No edema.     Left lower leg: No edema.     Left foot: Normal range of motion and normal capillary refill. No tenderness, bony tenderness, swelling, crepitus or deformity.     Comments: L foot-no signs of trauma  Faint tan line across top of foot No bony tenderness Nl rom of foot and ankle Nl sens/perfusion  Nl gait  No deformity /bunions  (symptoms are not happening during visit-they are intermittent)  Nl LS tenderness Nl rom spine and hip and knee/ankle   Neurological:      Mental Status: She is alert.     Sensory: Sensation is intact. No sensory deficit.     Motor: No weakness or atrophy.     Gait: Gait is intact.     Comments: Nl sens dorsal foot to light touch/pinprick/temp/vibration No tenderness           Assessment & Plan:   Problem List Items Addressed This Visit      Other   Left foot pain    Intermittent brief pain over top of foot (relatively small area)- burning in sensation  No rash or skin change She did change foot wear  Had ankle  sprain last month that does not seem to correlate Disc use of heat/ice Analgesics prn otc if needed  inst her to be mindful of assoc activities or positions Xray today -looking for evidence of stress fracture or deg change  Neuroma is also in differential -consider podiatry consult  inst to call if rash develops or change in symptoms      Relevant Orders   DG Foot Complete Left

## 2019-08-17 NOTE — Patient Instructions (Addendum)
Let's check a foot xray today to look for a stress fracture or signs of degenerative change   If very bothersome - tylenol (or ibuprofen with food)   Ice/heat- worth a try to see if it helps   Work on posture while you sit  Get a better seat if needed   I would consider a podiatry referral if needed

## 2019-08-17 NOTE — Assessment & Plan Note (Signed)
Intermittent brief pain over top of foot (relatively small area)- burning in sensation  No rash or skin change She did change foot wear  Had ankle sprain last month that does not seem to correlate Disc use of heat/ice Analgesics prn otc if needed  inst her to be mindful of assoc activities or positions Xray today -looking for evidence of stress fracture or deg change  Neuroma is also in differential -consider podiatry consult  inst to call if rash develops or change in symptoms

## 2019-08-19 ENCOUNTER — Telehealth: Payer: Self-pay | Admitting: Family Medicine

## 2019-08-19 DIAGNOSIS — M79672 Pain in left foot: Secondary | ICD-10-CM

## 2019-08-19 NOTE — Telephone Encounter (Signed)
-----   Message from Tammi Sou, [place] sent at 08/19/2019 12:59 PM EDT ----- Pt viewed results and Dr. Marliss Coots comments on mychart. Pt agrees with referral to Podiatrist. Please put referral in and I advised pt our [facility] will call and schedule appt

## 2019-08-19 NOTE — Telephone Encounter (Signed)
Ref done  I will cc to [facility] Let her know it may take a while (1-2 week) to get appt scheduled

## 2019-08-19 NOTE — Telephone Encounter (Signed)
Appt made and patient is aware. °

## 2019-09-03 ENCOUNTER — Ambulatory Visit: Payer: BC Managed Care – PPO | Admitting: Podiatry

## 2019-09-15 ENCOUNTER — Encounter: Payer: Self-pay | Admitting: Gynecology

## 2019-09-17 ENCOUNTER — Ambulatory Visit: Payer: BC Managed Care – PPO | Admitting: Podiatry

## 2019-09-17 ENCOUNTER — Other Ambulatory Visit: Payer: Self-pay

## 2019-09-17 VITALS — Temp 97.9°F

## 2019-09-17 DIAGNOSIS — M79672 Pain in left foot: Secondary | ICD-10-CM

## 2019-09-17 DIAGNOSIS — M19072 Primary osteoarthritis, left ankle and foot: Secondary | ICD-10-CM | POA: Diagnosis not present

## 2019-09-17 DIAGNOSIS — M19071 Primary osteoarthritis, right ankle and foot: Secondary | ICD-10-CM | POA: Diagnosis not present

## 2019-09-17 DIAGNOSIS — M792 Neuralgia and neuritis, unspecified: Secondary | ICD-10-CM | POA: Diagnosis not present

## 2019-09-17 DIAGNOSIS — M79671 Pain in right foot: Secondary | ICD-10-CM

## 2019-09-20 NOTE — Progress Notes (Signed)
   Subjective:    Patient ID: Anna [name], female    DOB: [birth year], 53 y.o.   MRN: [id number]  The patient presents for the above complaint.  History confirmed with patient.  Objective:   Constitutional Well developed. Well nourished.  Vascular Dorsalis pedis pulses palpable bilaterally. Posterior tibial pulses palpable bilaterally. Capillary refill normal to all digits. No cyanosis or clubbing noted. Pedal hair growth normal.  Neurologic Normal speech. Oriented to person, place, and time. Epicritic sensation to light touch grossly present bilaterally.  Dermatologic Nails well groomed and normal in appearance. No open wounds. No skin lesions.  Orthopedic: Prominent midfoot osteophyte at the dorsal aspect of the first and second tarsometatarsal joints without discrete pain to palpation.   Radiographs:  Prior radiographs reviewed.  Cystic lesion to the fifth proximal phalanx, midfoot degeneration.  No acute fractures or dislocations.  Assessment/Plan:  Patient was evaluated and treated and all questions answered.  1.  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 -  No pain to palpation today.  No nerve symptoms today.  Likely this has subsided.  Discussed with patient that should the symptoms persist, we would consider possible injection therapy to the area.  We will have patient follow up as needed.

## 2019-10-13 ENCOUNTER — Ambulatory Visit (INDEPENDENT_AMBULATORY_CARE_PROVIDER_SITE_OTHER): Payer: BC Managed Care – PPO | Admitting: Family Medicine

## 2019-10-13 ENCOUNTER — Encounter: Payer: Self-pay | Admitting: Family Medicine

## 2019-10-13 ENCOUNTER — Other Ambulatory Visit: Payer: Self-pay

## 2019-10-13 VITALS — BP 114/68 | HR 65 | Temp 97.3°F | Ht 64.0 in | Wt 139.2 lb

## 2019-10-13 DIAGNOSIS — N644 Mastodynia: Secondary | ICD-10-CM | POA: Diagnosis not present

## 2019-10-13 NOTE — Progress Notes (Signed)
Subjective:    Patient ID: Anna [name], female    DOB: January 13, 1966, 53 y.o.   MRN: [id number]  HPI Pt presents with breast pain  Has had this in the past  Both breasts  Soreness -moreso in the middle  Off/on  Thought due to hormone changes  It lingers this time - this has gone on for about 2 weeks   Activity- not as physically active  Is getting back to working out more  Also Basketball hit her in chest yesterday (but pain was already there)  Diet was not as healthy recently - 2 weeks  Not eating more processed foods  Caffeine -seldom to none  Water intake had decreased quite a lot- that is changing now  Wears sport bras when she works out   No night sweats  occ has a hot feeling in her face   No breast lumps -but does not check oftern  No skin changes  No nipple discharge   No new otc meds or vits or herbs     Still has her ovaries    Wt Readings from Last 3 Encounters:  10/13/19 139 lb 4 oz (63.2 kg)  08/17/19 139 lb 9 oz (63.3 kg)  07/06/19 138 lb (62.6 kg)   23.90 kg/m    Last mammogram report  MM 3D SCREEN BREAST BILATERAL (Accession [id number]) (Order [id number]) Imaging Date: 06/29/2019 Department: [facility] Released By: Robie Ridge Authorizing: Jerrol Helmers, Wynelle Fanny, MD  Exam Information  Status Exam Begun  Exam Ended   Final [99] 06/29/2019 4:49 PM 06/29/2019 5:01 PM  Study Result  CLINICAL DATA:  Screening.  EXAM: DIGITAL SCREENING BILATERAL MAMMOGRAM WITH TOMO AND CAD  COMPARISON:  Previous exam(s).  ACR Breast Density Category d: The breast tissue is extremely dense, which lowers the sensitivity of mammography  FINDINGS: There are no findings suspicious for malignancy. Images were processed with CAD.  IMPRESSION: No mammographic evidence of malignancy. A result letter of this screening mammogram will be mailed directly to the patient.  RECOMMENDATION: Screening mammogram in one year.  (Code:SM-B-01Y)  BI-RADS CATEGORY  1: Negative.   Electronically Signed   By: Claudie Revering M.D.   On: 06/29/2019 17:11    Maunt with breast cancer   Patient Active Problem List   Diagnosis Date Noted  . Left foot pain 08/17/2019  . Exposure to COVID-19 virus 07/26/2019  . Leg pain, lateral, left 12/03/2018  . Left knee pain 12/03/2018  . Skin lesion of right leg 11/09/2018  . Breast tenderness 06/30/2018  . Abdominal pain, generalized 08/13/2017  . Change in stool habits 08/13/2017  . Urticaria 06/11/2016  . Angioedema 06/11/2016  . Lip swelling 05/07/2016  . Lumbar pain 01/02/2016  . Gastritis 11/06/2015  . Folliculitis Q000111Q  . Allergic rhinitis 03/20/2007  . GERD 03/20/2007   Past Medical History:  Diagnosis Date  . Allergic rhinitis   . 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 Genital warts   . GERD (gastroesophageal reflux disease)   . Hiatal hernia    EGD- gastritis, esophagitis 09/2004  . History of HPV infection   . Knee MCL sprain 2007   Past Surgical History:  Procedure Laterality Date  . LAPAROSCOPIC ASSISTED VAGINAL HYSTERECTOMY N/A 07/27/2013   Procedure: LAPAROSCOPIC ASSISTED VAGINAL HYSTERECTOMY;  Surgeon: Anastasio Auerbach, MD for leiomyomata   Social History   Tobacco Use  . Smoking status: Never Smoker  . Smokeless tobacco: Never Used  Substance  Use Topics  . Alcohol use: No    Alcohol/week: 0.0 standard drinks  . Drug use: No   Family History  Problem Relation Age of Onset  . Aneurysm Father        cerebral  . Breast cancer Maternal Aunt        50's  . Cancer Paternal Aunt        Uterine  . Colon cancer Neg Hx   . Rectal cancer Neg Hx   . Esophageal cancer Neg Hx   . Stomach cancer Neg Hx   . Liver cancer Neg Hx    No Known Allergies Current Outpatient Medications on File Prior to Visit  Medication Sig Dispense Refill  . BLACK CURRANT SEED OIL PO Take 1 capsule by mouth daily.    Marland Kitchen CALCIUM PO Take 2 capsules by  mouth daily.    . cetirizine (ZYRTEC) 10 MG tablet Take 10 mg by mouth as needed.     . Cholecalciferol (VITAMIN D3 PO) Take 1 capsule by mouth daily.    . diphenhydrAMINE (BENADRYL) 50 MG capsule Take 25 mg by mouth as needed.     . Multiple Vitamin (MULTIVITAMIN) tablet Take 1 tablet by mouth daily.    Marland Kitchen omeprazole (PRILOSEC) 20 MG capsule Take 1 capsule (20 mg total) by mouth daily. (Patient taking differently: Take 20 mg by mouth daily as needed. ) 30 capsule 5   No current facility-administered medications on file prior to visit.     Review of Systems  Constitutional: Negative for activity change, appetite change, fatigue, fever and unexpected weight change.  HENT: Negative for congestion, ear pain, rhinorrhea, sinus pressure and sore throat.   Eyes: Negative for pain, redness and visual disturbance.  Respiratory: Negative for cough, shortness of breath and wheezing.   Cardiovascular: Negative for chest pain and palpitations.  Gastrointestinal: Negative for abdominal pain, blood in stool, constipation and diarrhea.  Endocrine: Negative for polydipsia and polyuria.  Genitourinary: Negative for dysuria, frequency and urgency.       Breast tenderness  Musculoskeletal: Negative for arthralgias, back pain and myalgias.  Skin: Negative for color change, pallor, rash and wound.  Allergic/Immunologic: Negative for environmental allergies.  Neurological: Negative for dizziness, syncope and headaches.  Hematological: Negative for adenopathy. Does not bruise/bleed easily.  Psychiatric/Behavioral: Negative for decreased concentration and dysphoric mood. The patient is not nervous/anxious.        Objective:   Physical Exam Constitutional:      General: She is not in acute distress.    Appearance: Normal appearance. She is not ill-appearing or diaphoretic.  HENT:     Head: Atraumatic.     Mouth/Throat:     Mouth: Mucous membranes are moist.  Eyes:     General: No scleral icterus.     Conjunctiva/sclera: Conjunctivae normal.     Pupils: Pupils are equal, round, and reactive to light.  Neck:     Musculoskeletal: Normal range of motion. No neck rigidity or muscular tenderness.     Vascular: No carotid bruit.  Cardiovascular:     Rate and Rhythm: Normal rate and regular rhythm.     Pulses: Normal pulses.  Pulmonary:     Effort: Pulmonary effort is normal. No respiratory distress.     Breath sounds: Normal breath sounds. No wheezing or rales.  Genitourinary:    Comments: Breast exam: No mass, nodules, thickening, bulging, retraction, inflamation, nipple discharge or skin changes noted.  No axillary or clavicular LA.    Mild  tenderness in outer quadrants Dense breast tissue  Skin:    General: Skin is warm and dry.     Coloration: Skin is not pale.     Findings: No erythema or rash.  Neurological:     Mental Status: She is alert.     Sensory: No sensory deficit.  Psychiatric:        Mood and Affect: Mood normal.           Assessment & Plan:   Problem List Items Addressed This Visit      Other   Breast tenderness - Primary    Nl exam today with baseline dense breast tissue  Rev mammogram from July Again suspect this may be hormonal Disc clothing/support/exercise and caff intake See avs If no imp in 2 wk will consider imaging

## 2019-10-13 NOTE — Assessment & Plan Note (Signed)
Nl exam today with baseline dense breast tissue  Rev mammogram from July Again suspect this may be hormonal Disc clothing/support/exercise and caff intake See avs If no imp in 2 wk will consider imaging

## 2019-10-13 NOTE — Patient Instructions (Addendum)
Wear well fitting bras  Make sure supportive for exercise  Get back to regular exercise  Avoid caffeine  Eat a balanced diet   Analgesics over the counter are ok ok if needed Ice/heat can help  If no improvement in 2 weeks call us and we will set up some imaging  Try to do self breast exams monthly

## 2020-01-04 ENCOUNTER — Ambulatory Visit (INDEPENDENT_AMBULATORY_CARE_PROVIDER_SITE_OTHER): Payer: BC Managed Care – PPO | Admitting: Family Medicine

## 2020-01-04 ENCOUNTER — Other Ambulatory Visit: Payer: Self-pay

## 2020-01-04 ENCOUNTER — Encounter: Payer: Self-pay | Admitting: Family Medicine

## 2020-01-04 VITALS — BP 112/72 | HR 69 | Temp 97.5°F | Ht 64.0 in | Wt 142.1 lb

## 2020-01-04 DIAGNOSIS — R1011 Right upper quadrant pain: Secondary | ICD-10-CM

## 2020-01-04 DIAGNOSIS — R109 Unspecified abdominal pain: Secondary | ICD-10-CM | POA: Insufficient documentation

## 2020-01-04 DIAGNOSIS — R10A1 Flank pain, right side: Secondary | ICD-10-CM | POA: Insufficient documentation

## 2020-01-04 LAB — POC URINALSYSI DIPSTICK (AUTOMATED)
Bilirubin, UA: NEGATIVE
Blood, UA: NEGATIVE
Glucose, UA: NEGATIVE
Ketones, UA: 5
Leukocytes, UA: NEGATIVE
Nitrite, UA: NEGATIVE
Protein, UA: NEGATIVE
Spec Grav, UA: 1.025 (ref 1.010–1.025)
Urobilinogen, UA: 0.2 E.U./dL
pH, UA: 6 (ref 5.0–8.0)

## 2020-01-04 NOTE — Assessment & Plan Note (Signed)
Suspect this is related to the RUQ abd pain  Timing is not always the same Perhaps triggered by movement  ua neg  [place] abd ordered  Offered muscle relaxer in case of spasm-pt declined  Disc use of heat

## 2020-01-04 NOTE — Assessment & Plan Note (Signed)
Intermittent w/o trigger  Radiates to back or vice versa No symptoms today but had slt tenderness  UA is clear  abd [place] ordered  inst pt to alert [place] if symptoms worsen in meantime

## 2020-01-04 NOTE — Progress Notes (Signed)
Subjective:    Patient ID: Anna [name], female    DOB: 01/04/1966, 54 y.o.   MRN: [id number]  This visit occurred during the SARS-CoV-2 public health emergency.  Safety protocols were in place, including screening questions prior to the visit, additional usage of staff PPE, and extensive cleaning of exam room while observing appropriate contact time as indicated for disinfecting solutions.    HPI Pt presents with R flank pain   Wt Readings from Last 3 Encounters:  01/04/20 142 lb 2 oz (64.5 kg)  10/13/19 139 lb 4 oz (63.2 kg)  08/17/19 139 lb 9 oz (63.3 kg)   24.40 kg/m   Here for pain in her R back  It also radiates to her RUQ of abdomen (not always- just sometimes)  occ gas/cramping all over   Not a sharp pain  /usually dull  Episodes last a few hours usually   (a few times through the night)   Eating does not make a difference   No longer takes ppi occ heartburn -not bad or frequent   occ R buttock but not at the same time   Standing for a long time makes it worse Vigorous activity - upper body , makes it worse  Notes she used to hold her kids on that hip   She stopped some of her upper body work outs and it did not improve  Pain is not bad enough to take anything  Has not tried heat or ice   Today no symptoms (a good day)   Changed her diet on Sunday - eating veg/fruit/beans and nuts and nut butters  Some juices    No urinary symptoms at all  Results for orders placed or performed in visit on 01/04/20  POCT Urinalysis Dipstick (Automated)  Result Value Ref Range   Color, UA Yellow    Clarity, UA Clear    Glucose, UA Negative Negative   Bilirubin, UA Negative    Ketones, UA 5 mg/dL    Spec Grav, UA 1.025 1.010 - 1.025   Blood, UA Negative    pH, UA 6.0 5.0 - 8.0   Protein, UA Negative Negative   Urobilinogen, UA 0.2 0.2 or 1.0 E.U./dL   Nitrite, UA Negative    Leukocytes, UA Negative Negative    Patient Active Problem List   Diagnosis  Date Noted  . RUQ pain 01/04/2020  . Right flank pain 01/04/2020  . Left foot pain 08/17/2019  . Exposure to COVID-19 virus 07/26/2019  . Leg pain, lateral, left 12/03/2018  . Left knee pain 12/03/2018  . Skin lesion of right leg 11/09/2018  . Breast tenderness 06/30/2018  . Abdominal pain, generalized 08/13/2017  . Change in stool habits 08/13/2017  . Urticaria 06/11/2016  . Angioedema 06/11/2016  . Lip swelling 05/07/2016  . Lumbar pain 01/02/2016  . Gastritis 11/06/2015  . Folliculitis Q000111Q  . Allergic rhinitis 03/20/2007  . GERD 03/20/2007   Past Medical History:  Diagnosis Date  . Allergic rhinitis   . 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 Genital warts   . GERD (gastroesophageal reflux disease)   . Hiatal hernia    EGD- gastritis, esophagitis 09/2004  . History of HPV infection   . Knee MCL sprain 2007   Past Surgical History:  Procedure Laterality Date  . LAPAROSCOPIC ASSISTED VAGINAL HYSTERECTOMY N/A 07/27/2013   Procedure: LAPAROSCOPIC ASSISTED VAGINAL HYSTERECTOMY;  Surgeon: Anastasio Auerbach, MD for leiomyomata   Social History   Tobacco  Use  . Smoking status: Never Smoker  . Smokeless tobacco: Never Used  Substance Use Topics  . Alcohol use: No    Alcohol/week: 0.0 standard drinks  . Drug use: No   Family History  Problem Relation Age of Onset  . Aneurysm Father        cerebral  . Breast cancer Maternal Aunt        50's  . Cancer Paternal Aunt        Uterine  . Colon cancer Neg Hx   . Rectal cancer Neg Hx   . Esophageal cancer Neg Hx   . Stomach cancer Neg Hx   . Liver cancer Neg Hx    No Known Allergies Current Outpatient Medications on File Prior to Visit  Medication Sig Dispense Refill  . Ascorbic Acid (VITAMIN C PO) Take 1 capsule by mouth daily.    . B Complex-C-Folic Acid (SM B SUPER VITAMIN COMPLEX PO) Take 1 capsule by mouth daily.    . cetirizine (ZYRTEC) 10 MG tablet Take 10 mg by mouth as needed.     . Cholecalciferol  (VITAMIN D3 PO) Take 2,000 Units by mouth daily.     . diphenhydrAMINE (BENADRYL) 50 MG capsule Take 25 mg by mouth as needed.     Marland Kitchen BLACK CURRANT SEED OIL PO Take 1 capsule by mouth daily.    Marland Kitchen CALCIUM PO Take 2 capsules by mouth daily.     No current facility-administered medications on file prior to visit.    Review of Systems  Constitutional: Negative for activity change, appetite change, fatigue, fever and unexpected weight change.  HENT: Negative for congestion, ear pain, rhinorrhea, sinus pressure and sore throat.   Eyes: Negative for pain, redness and visual disturbance.  Respiratory: Negative for cough, shortness of breath and wheezing.   Cardiovascular: Negative for chest pain and palpitations.  Gastrointestinal: Positive for abdominal pain and nausea. Negative for abdominal distention, anal bleeding, blood in stool, constipation, diarrhea, rectal pain and vomiting.  Endocrine: Negative for polydipsia and polyuria.  Genitourinary: Positive for flank pain. Negative for decreased urine volume, dysuria, frequency and urgency.  Musculoskeletal: Positive for back pain. Negative for arthralgias and myalgias.  Skin: Negative for pallor and rash.  Allergic/Immunologic: Negative for environmental allergies.  Neurological: Negative for dizziness, syncope and headaches.  Hematological: Negative for adenopathy. Does not bruise/bleed easily.  Psychiatric/Behavioral: Negative for decreased concentration and dysphoric mood. The patient is not nervous/anxious.        Objective:   Physical Exam Constitutional:      General: She is not in acute distress.    Appearance: Normal appearance. She is well-developed and normal weight. She is not ill-appearing.  HENT:     Head: Normocephalic and atraumatic.     Mouth/Throat:     Mouth: Mucous membranes are moist.  Eyes:     General: No scleral icterus.    Conjunctiva/sclera: Conjunctivae normal.     Pupils: Pupils are equal, round, and reactive  to light.  Neck:     Vascular: No carotid bruit.  Cardiovascular:     Rate and Rhythm: Normal rate and regular rhythm.     Pulses: Normal pulses.     Heart sounds: Normal heart sounds.  Pulmonary:     Effort: Pulmonary effort is normal. No respiratory distress.     Breath sounds: Normal breath sounds. No wheezing or rales.     Comments: Good air exch No pleuritic pain  Abdominal:     General:  Abdomen is flat. Bowel sounds are normal. There is no distension or abdominal bruit.     Palpations: Abdomen is soft. There is no hepatomegaly, splenomegaly, mass or pulsatile mass.     Tenderness: There is abdominal tenderness in the right upper quadrant. There is no right CVA tenderness, left CVA tenderness, guarding or rebound. Negative signs include Murphy's sign and McBurney's sign.     Comments: Mild RUQ tenderness to deep palpation  Neg murphy's sign  Musculoskeletal:     Cervical back: Normal range of motion and neck supple.     Thoracic back: No edema, tenderness or bony tenderness. Normal range of motion. No scoliosis.     Comments: Nl rom of TS and LS  Nl gait   Lymphadenopathy:     Cervical: No cervical adenopathy.  Skin:    General: Skin is warm and dry.     Coloration: Skin is not pale.     Findings: No erythema or rash.  Neurological:     Mental Status: She is alert. Mental status is at baseline.     Sensory: No sensory deficit.     Coordination: Coordination normal.     Deep Tendon Reflexes: Reflexes normal.  Psychiatric:        Mood and Affect: Mood normal.           Assessment & Plan:   Problem List Items Addressed This Visit      Other   RUQ pain - Primary    Intermittent w/o trigger  Radiates to back or vice versa No symptoms today but had slt tenderness  UA is clear  abd [place] ordered  inst pt to alert [place] if symptoms worsen in meantime      Relevant Orders   US Abdomen Complete   Right flank pain    Suspect this is related to the RUQ abd pain  Timing  is not always the same Perhaps triggered by movement  ua neg  [place] abd ordered  Offered muscle relaxer in case of spasm-pt declined  Disc use of heat      Relevant Orders   US Abdomen Complete   POCT Urinalysis Dipstick (Automated) (Completed)

## 2020-01-04 NOTE — Patient Instructions (Signed)
Keep eating a healthy diet   Let's get a urine sample on the way out  I also ordered an ultrasound of the abdomen- our office will call you to set that up   If symptoms worsen, let me know  If back pain intensifies- we can try a muscle relaxer  Also try some heat for 10 minutes at a time when it hits    Keep me posted

## 2020-01-07 ENCOUNTER — Telehealth: Payer: Self-pay | Admitting: Family Medicine

## 2020-01-07 MED ORDER — METHOCARBAMOL 500 MG PO TABS: 500.0000 mg | ORAL_TABLET | Freq: Three times a day (TID) | ORAL | 1 refills | Status: DC | PRN

## 2020-01-07 NOTE — Telephone Encounter (Signed)
Patient was seen this week and told if she needed muscle relaxer to call back.  Patient would like a rx for muscle relaxer sent to [facility].  Patient said she's doing okay. She said she has a dull,aching pain if she turns a certain way.

## 2020-01-17 ENCOUNTER — Ambulatory Visit
Admission: RE | Admit: 2020-01-17 | Discharge: 2020-01-17 | Disposition: A | Discharge status: Home / Self Care | Payer: BC Managed Care – PPO | Source: Ambulatory Visit | Attending: Family Medicine | Admitting: Family Medicine

## 2020-01-17 DIAGNOSIS — R1011 Right upper quadrant pain: Secondary | ICD-10-CM

## 2020-01-17 DIAGNOSIS — R109 Unspecified abdominal pain: Secondary | ICD-10-CM

## 2020-03-06 ENCOUNTER — Ambulatory Visit: Payer: BC Managed Care – PPO | Attending: Pediatrics

## 2020-03-06 DIAGNOSIS — Z23 Encounter for immunization: Secondary | ICD-10-CM

## 2020-03-06 NOTE — Progress Notes (Signed)
   Covid-19 Vaccination Clinic  Name:  Anna [name]    MRN: [id number] DOB: Feb 23, 1966  03/06/2020  [name] was observed post Covid-19 immunization for 30 minutes based on pre-vaccination screening without incident. She was provided with Vaccine Information Sheet and instruction to access the V-Safe system.   [name] was instructed to call 911 with any severe reactions post vaccine: Marland Kitchen Difficulty breathing  . Swelling of face and throat  . A fast heartbeat  . A bad rash all over body  . Dizziness and weakness   Immunizations Administered    Name Date Dose VIS Date Route   Pfizer COVID-19 Vaccine 03/06/2020 12:36 PM 0.3 mL 12/03/2019 Intramuscular   Manufacturer: [place]   Lot: [id number]   [place]: [id number]

## 2020-03-28 ENCOUNTER — Encounter: Payer: Self-pay | Admitting: Family Medicine

## 2020-03-28 ENCOUNTER — Ambulatory Visit (INDEPENDENT_AMBULATORY_CARE_PROVIDER_SITE_OTHER): Payer: BC Managed Care – PPO | Admitting: Family Medicine

## 2020-03-28 ENCOUNTER — Ambulatory Visit: Payer: BC Managed Care – PPO

## 2020-03-28 ENCOUNTER — Other Ambulatory Visit: Payer: Self-pay

## 2020-03-28 VITALS — BP 118/70 | HR 73 | Temp 97.8°F | Ht 64.0 in | Wt 145.1 lb

## 2020-03-28 DIAGNOSIS — N644 Mastodynia: Secondary | ICD-10-CM | POA: Diagnosis not present

## 2020-03-28 NOTE — Progress Notes (Signed)
Subjective:    Patient ID: Anna [name], female    DOB: 11-16-1966, 54 y.o.   MRN: [id number]  This visit occurred during the SARS-CoV-2 public health emergency.  Safety protocols were in place, including screening questions prior to the visit, additional usage of staff PPE, and extensive cleaning of exam room while observing appropriate contact time as indicated for disinfecting solutions.    HPI Pt presents with breast discomfort   Wt Readings from Last 3 Encounters:  03/28/20 145 lb 2 oz (65.8 kg)  01/04/20 142 lb 2 oz (64.5 kg)  10/13/19 139 lb 4 oz (63.2 kg)   24.91 kg/m   She has known fibrocystic change  Possibly worsened with hormonal shifts    Now having more issues with L breast  Very sore over lower L breast / feels dense and more prominent than the R side occ sharp pain shoots down her ribs under breast  occ itchy  No rash at all   No change in bra size  Thinks L has always been bigger  No nipple d/c   She is not having hot flashes at this time Had a partial hysterectomy   First vaccine 3/15  Next one is due tomorrow   Sport bra- fits well and minimizes movement  Does not wear under wire bras   Caffeine - eating more chocolate lately  Unsure if her tea has caffeine   Takes vit D and ca and B complex and C     Mammogram 06/29/19 MM 3D SCREEN BREAST BILATERAL (Accession [id number]) (Order [id number]) Imaging Date: 06/29/2019 Department: [facility] Released By: Robie Ridge Authorizing: Kate Sweetman, Wynelle Fanny, MD  Exam Status  Status  Final [99]  Study Result  CLINICAL DATA:  Screening.  EXAM: DIGITAL SCREENING BILATERAL MAMMOGRAM WITH TOMO AND CAD  COMPARISON:  Previous exam(s).  ACR Breast Density Category d: The breast tissue is extremely dense, which lowers the sensitivity of mammography  FINDINGS: There are no findings suspicious for malignancy. Images were processed with CAD.  IMPRESSION: No  mammographic evidence of malignancy. A result letter of this screening mammogram will be mailed directly to the patient.  RECOMMENDATION: Screening mammogram in one year. (Code:SM-B-01Y)  BI-RADS CATEGORY  1: Negative.   Electronically Signed   By: Claudie Revering M.D.   On: 06/29/2019 17:11   Maternal aunt had breast cancer   Patient Active Problem List   Diagnosis Date Noted  . RUQ pain 01/04/2020  . Right flank pain 01/04/2020  . Left foot pain 08/17/2019  . Exposure to COVID-19 virus 07/26/2019  . Leg pain, lateral, left 12/03/2018  . Left knee pain 12/03/2018  . Skin lesion of right leg 11/09/2018  . Breast tenderness 06/30/2018  . Abdominal pain, generalized 08/13/2017  . Change in stool habits 08/13/2017  . Urticaria 06/11/2016  . Angioedema 06/11/2016  . Lip swelling 05/07/2016  . Lumbar pain 01/02/2016  . Gastritis 11/06/2015  . Folliculitis [id number]  . Allergic rhinitis 03/20/2007  . GERD 03/20/2007   Past Medical History:  Diagnosis Date  . Allergic rhinitis   . 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 Genital warts   . GERD (gastroesophageal reflux disease)   . Hiatal hernia    EGD- gastritis, esophagitis 09/2004  . History of HPV infection   . Knee MCL sprain 2007   Past Surgical History:  Procedure Laterality Date  . LAPAROSCOPIC ASSISTED VAGINAL HYSTERECTOMY N/A 07/27/2013   Procedure: LAPAROSCOPIC ASSISTED  VAGINAL HYSTERECTOMY;  Surgeon: Anastasio Auerbach, MD for leiomyomata   Social History   Tobacco Use  . Smoking status: Never Smoker  . Smokeless tobacco: Never Used  Substance Use Topics  . Alcohol use: No    Alcohol/week: 0.0 standard drinks  . Drug use: No   Family History  Problem Relation Age of Onset  . Aneurysm Father        cerebral  . Breast cancer Maternal Aunt        50's  . Cancer Paternal Aunt        Uterine  . Colon cancer Neg Hx   . Rectal cancer Neg Hx   . Esophageal cancer Neg Hx   . Stomach cancer Neg Hx     . Liver cancer Neg Hx    No Known Allergies Current Outpatient Medications on File Prior to Visit  Medication Sig Dispense Refill  . Ascorbic Acid (VITAMIN C PO) Take 1 capsule by mouth daily.    . B Complex-C-Folic Acid (SM B SUPER VITAMIN COMPLEX PO) Take 1 capsule by mouth daily.    Marland Kitchen CALCIUM PO Take 2 capsules by mouth daily.    . cetirizine (ZYRTEC) 10 MG tablet Take 10 mg by mouth as needed.     . Cholecalciferol (VITAMIN D3 PO) Take 2,000 Units by mouth daily.     . diphenhydrAMINE (BENADRYL) 50 MG capsule Take 25 mg by mouth as needed.     . methocarbamol (ROBAXIN) 500 MG tablet Take 1 tablet (500 mg total) by mouth every 8 (eight) hours as needed for muscle spasms (back pain). Caution of sedation 30 tablet 1   No current facility-administered medications on file prior to visit.    Review of Systems  Constitutional: Negative for activity change, appetite change, fatigue, fever and unexpected weight change.  HENT: Negative for congestion, ear pain, rhinorrhea, sinus pressure and sore throat.   Eyes: Negative for pain, redness and visual disturbance.  Respiratory: Negative for cough, shortness of breath and wheezing.   Cardiovascular: Negative for chest pain and palpitations.  Gastrointestinal: Negative for abdominal pain, blood in stool, constipation and diarrhea.  Endocrine: Negative for polydipsia and polyuria.  Genitourinary: Negative for dysuria, frequency and urgency.  Musculoskeletal: Negative for arthralgias, back pain and myalgias.  Skin: Negative for pallor and rash.  Allergic/Immunologic: Negative for environmental allergies.  Neurological: Negative for dizziness, syncope and headaches.  Hematological: Negative for adenopathy. Does not bruise/bleed easily.  Psychiatric/Behavioral: Negative for decreased concentration and dysphoric mood. The patient is not nervous/anxious.        Objective:   Physical Exam Constitutional:      General: She is not in acute  distress.    Appearance: Normal appearance. She is normal weight. She is not ill-appearing.  Eyes:     General: No scleral icterus.    Conjunctiva/sclera: Conjunctivae normal.     Pupils: Pupils are equal, round, and reactive to light.  Neck:     Vascular: No carotid bruit.  Cardiovascular:     Rate and Rhythm: Normal rate and regular rhythm.     Heart sounds: Normal heart sounds.  Pulmonary:     Effort: Pulmonary effort is normal. No respiratory distress.     Breath sounds: Normal breath sounds. No wheezing or rales.  Abdominal:     General: Abdomen is flat. Bowel sounds are normal. There is no distension.     Palpations: There is no mass.     Tenderness: There is no abdominal  tenderness.  Genitourinary:    Comments: Breast exam: No mass, nodules, thickening,  bulging, retraction, inflamation, nipple discharge or skin changes noted.  No axillary or clavicular LA.     Dense breasts bilaterally  Tenderness noted in lower/lateral L breast   Musculoskeletal:     Cervical back: Normal range of motion and neck supple.  Lymphadenopathy:     Cervical: No cervical adenopathy.  Neurological:     Mental Status: She is alert.  Psychiatric:        Mood and Affect: Mood normal.           Assessment & Plan:   Problem List Items Addressed This Visit      Other   Breast tenderness - Primary    Discomfort continues-especially in L breast in the setting of fibrocystic change/dense breasts  Enc caffeine avoidance and proper fitting bra  Pt wants to hold off on any imaging until 1 mo after 2nd covid vaccine which she is getting tomorrow   inst to alert [place] in 1 mo (or earlier if needed) if no improvement or worse  Would consider diag mammogram and [place]

## 2020-03-28 NOTE — Assessment & Plan Note (Signed)
Discomfort continues-especially in L breast in the setting of fibrocystic change/dense breasts  Enc caffeine avoidance and proper fitting bra  Pt wants to hold off on any imaging until 1 mo after 2nd covid vaccine which she is getting tomorrow   inst to alert [place] in 1 mo (or earlier if needed) if no improvement or worse  Would consider diag mammogram and [place]

## 2020-03-28 NOTE — Patient Instructions (Addendum)
Minimize caffeine if you can  Wear a supportive bra that is not too tight  Watch for skin changes or nipple discharge  I think you have dense breast tissue   If you develop changes let me know   Let us know in about a month (after 2nd covid vaccine) if you still have symptoms and I will plan some imaging   Vitamin E may help breast tenderness -you can try it   You do not need to take a multi vitamin with your other supplements and with a balanced diet

## 2020-03-29 ENCOUNTER — Ambulatory Visit: Payer: BC Managed Care – PPO | Attending: Internal Medicine

## 2020-03-29 DIAGNOSIS — Z23 Encounter for immunization: Secondary | ICD-10-CM

## 2020-03-29 NOTE — Progress Notes (Signed)
   Covid-19 Vaccination Clinic  Name:  Anna [name]    MRN: [id number] DOB: 05-13-1966  03/29/2020  Ms. Kosman was observed post Covid-19 immunization for 15 minutes without incident. She was provided with Vaccine Information Sheet and instruction to access the V-Safe system.   Ms. Seibold was instructed to call 911 with any severe reactions post vaccine: Marland Kitchen Difficulty breathing  . Swelling of face and throat  . A fast heartbeat  . A bad rash all over body  . Dizziness and weakness   Immunizations Administered    Name Date Dose VIS Date Route   Pfizer COVID-19 Vaccine 03/29/2020  2:06 PM 0.3 mL 12/03/2019 Intramuscular   Manufacturer: [place]   Lot: [id number]   [place]: [id number]

## 2020-04-24 ENCOUNTER — Other Ambulatory Visit: Payer: BC Managed Care – PPO

## 2020-04-24 ENCOUNTER — Ambulatory Visit: Payer: BC Managed Care – PPO | Attending: Internal Medicine

## 2020-04-24 DIAGNOSIS — Z20822 Contact with and (suspected) exposure to covid-19: Secondary | ICD-10-CM

## 2020-04-26 LAB — SARS-COV-2, NAA 2 DAY TAT

## 2020-04-26 LAB — NOVEL CORONAVIRUS, NAA: SARS-CoV-2, NAA: NOT DETECTED

## 2020-06-01 ENCOUNTER — Other Ambulatory Visit: Payer: Self-pay | Admitting: Family Medicine

## 2020-06-01 DIAGNOSIS — Z1231 Encounter for screening mammogram for malignant neoplasm of breast: Secondary | ICD-10-CM

## 2020-06-30 ENCOUNTER — Ambulatory Visit: Payer: BC Managed Care – PPO

## 2020-07-06 ENCOUNTER — Encounter: Payer: BC Managed Care – PPO | Admitting: Obstetrics and Gynecology

## 2020-07-06 ENCOUNTER — Encounter: Payer: BC Managed Care – PPO | Admitting: Gynecology

## 2020-07-10 ENCOUNTER — Encounter: Payer: BC Managed Care – PPO | Admitting: Family Medicine

## 2020-08-03 DIAGNOSIS — Z20822 Contact with and (suspected) exposure to covid-19: Secondary | ICD-10-CM | POA: Diagnosis not present

## 2020-08-04 ENCOUNTER — Telehealth: Payer: Self-pay | Admitting: Family Medicine

## 2020-08-04 DIAGNOSIS — Z0000 Encounter for general adult medical examination without abnormal findings: Secondary | ICD-10-CM | POA: Insufficient documentation

## 2020-08-04 NOTE — Telephone Encounter (Signed)
-----   Message from Cloyd Stagers, RT sent at 07/24/2020  2:16 PM EDT ----- Regarding: Lab Orders for Monday 8.16.2021 Please place lab orders for Monday 8.16.2021, office visit for physical on Monday 8.23.2021 Thank you, Dyke Maes RT(R)

## 2020-08-07 ENCOUNTER — Other Ambulatory Visit: Payer: Self-pay

## 2020-08-07 ENCOUNTER — Other Ambulatory Visit (INDEPENDENT_AMBULATORY_CARE_PROVIDER_SITE_OTHER): Payer: BC Managed Care – PPO

## 2020-08-07 DIAGNOSIS — Z0000 Encounter for general adult medical examination without abnormal findings: Secondary | ICD-10-CM | POA: Diagnosis not present

## 2020-08-07 LAB — LIPID PANEL
Cholesterol: 181 mg/dL (ref 0–200)
HDL: 57.7 mg/dL (ref >39.00)
LDL Cholesterol: 105 mg/dL — ABNORMAL HIGH (ref 0–99)
NonHDL: 123.56
Total CHOL/HDL Ratio: 3
Triglycerides: 91 mg/dL (ref 0.0–149.0)
VLDL: 18.2 mg/dL (ref 0.0–40.0)

## 2020-08-07 LAB — COMPREHENSIVE METABOLIC PANEL
ALT: 11 U/L (ref 0–35)
AST: 12 U/L (ref 0–37)
Albumin: 3.9 g/dL (ref 3.5–5.2)
Alkaline Phosphatase: 48 U/L (ref 39–117)
BUN: 13 mg/dL (ref 6–23)
CO2: 28 mEq/L (ref 19–32)
Calcium: 9.3 mg/dL (ref 8.4–10.5)
Chloride: 104 mEq/L (ref 96–112)
Creatinine, Ser: 0.77 mg/dL (ref 0.40–1.20)
GFR: 94.91 mL/min (ref >60.00)
Glucose, Bld: 82 mg/dL (ref 70–99)
Potassium: 3.9 mEq/L (ref 3.5–5.1)
Sodium: 138 mEq/L (ref 135–145)
Total Bilirubin: 0.6 mg/dL (ref 0.2–1.2)
Total Protein: 6.5 g/dL (ref 6.0–8.3)

## 2020-08-07 LAB — CBC WITH DIFFERENTIAL/PLATELET
Basophils Absolute: 0 10*3/uL (ref 0.0–0.1)
Basophils Relative: 0.4 % (ref 0.0–3.0)
Eosinophils Absolute: 0.2 10*3/uL (ref 0.0–0.7)
Eosinophils Relative: 3.5 % (ref 0.0–5.0)
HCT: 37.8 % (ref 36.0–46.0)
Hemoglobin: 12.5 g/dL (ref 12.0–15.0)
Lymphocytes Relative: 29.2 % (ref 12.0–46.0)
Lymphs Abs: 1.9 10*3/uL (ref 0.7–4.0)
MCHC: 33 g/dL (ref 30.0–36.0)
MCV: 88.5 fl (ref 78.0–100.0)
Monocytes Absolute: 0.6 10*3/uL (ref 0.1–1.0)
Monocytes Relative: 9.6 % (ref 3.0–12.0)
Neutro Abs: 3.8 10*3/uL (ref 1.4–7.7)
Neutrophils Relative %: 57.3 % (ref 43.0–77.0)
Platelets: 336 10*3/uL (ref 150.0–400.0)
RBC: 4.28 Mil/uL (ref 3.87–5.11)
RDW: 13.4 % (ref 11.5–15.5)
WBC: 6.6 10*3/uL (ref 4.0–10.5)

## 2020-08-07 LAB — TSH: TSH: 2.72 u[IU]/mL (ref 0.35–4.50)

## 2020-08-11 ENCOUNTER — Other Ambulatory Visit: Payer: Self-pay

## 2020-08-11 ENCOUNTER — Ambulatory Visit
Admission: RE | Admit: 2020-08-11 | Discharge: 2020-08-11 | Disposition: A | Discharge status: Home / Self Care | Payer: BC Managed Care – PPO | Source: Ambulatory Visit | Attending: Family Medicine | Admitting: Family Medicine

## 2020-08-11 DIAGNOSIS — Z1231 Encounter for screening mammogram for malignant neoplasm of breast: Secondary | ICD-10-CM

## 2020-08-14 ENCOUNTER — Ambulatory Visit (INDEPENDENT_AMBULATORY_CARE_PROVIDER_SITE_OTHER): Payer: BC Managed Care – PPO | Admitting: Family Medicine

## 2020-08-14 ENCOUNTER — Encounter: Payer: Self-pay | Admitting: Family Medicine

## 2020-08-14 ENCOUNTER — Other Ambulatory Visit: Payer: Self-pay

## 2020-08-14 VITALS — BP 114/60 | HR 74 | Temp 97.1°F | Ht 63.75 in | Wt 144.1 lb

## 2020-08-14 DIAGNOSIS — Z0000 Encounter for general adult medical examination without abnormal findings: Secondary | ICD-10-CM | POA: Diagnosis not present

## 2020-08-14 DIAGNOSIS — M79605 Pain in left leg: Secondary | ICD-10-CM

## 2020-08-14 NOTE — Assessment & Plan Note (Addendum)
Reviewed health habits including diet and exercise and skin cancer prevention Reviewed appropriate screening tests for age  Also reviewed health mt list, fam hx and immunization status , as well as social and family history   See HPI Planning flu vaccine in the fall  Disc shingrix vaccine-interested if covered by ins covid staus-vaccinated  S/p hysterectomy and declines std screen  Nl bimanual exam

## 2020-08-14 NOTE — Assessment & Plan Note (Signed)
This comes and goes/sometimes sharp  Works out regularly  Disc use of ice on upper lat thigh (trochanter) and also IT band stretching

## 2020-08-14 NOTE — Patient Instructions (Addendum)
Get your flu vaccine in the fall   If you are interested in the shingles vaccine series (Shingrix), call your insurance or pharmacy to check on coverage and location it must be given.  If affordable - you can schedule it here or at your pharmacy depending on coverage    Keep up the good work with healthy diet and exercise   For cholesterol: Avoid red meat/ fried foods/ egg yolks/ fatty breakfast meats/ butter, cheese and high fat dairy/ and shellfish

## 2020-08-14 NOTE — Progress Notes (Signed)
Subjective:    Patient ID: Anna [name], female    DOB: 12-31-1965, 54 y.o.   MRN: [id number]  This visit occurred during the SARS-CoV-2 public health emergency.  Safety protocols were in place, including screening questions prior to the visit, additional usage of staff PPE, and extensive cleaning of exam room while observing appropriate contact time as indicated for disinfecting solutions.    HPI Here for health maintenance exam and to review chronic medical problems    Wt Readings from Last 3 Encounters:  08/14/20 144 lb 2 oz (65.4 kg)  03/28/20 145 lb 2 oz (65.8 kg)  01/04/20 142 lb 2 oz (64.5 kg)   24.93 kg/m  Doing well  Was able to go to [place] - visited family and did some site seeing and hiking  Plans to go back next year  May move there some day   Pain occ in L hip area /lat thigh  Back to work now [employer] able to work some when she was there    Flu vaccine-will get in the fall  covid-vaccinated with pfizer Tdap 5/14 Zoster status - has never had chicken pox    Pap 4/19 -neg with neg hpv  Had a hysterectomy in 2014 (for fibroids)  She has occasional cramps in low abd (partial hysterectomy)  Used to see Dr Barbaraann Rondo left   No vaginal d/c   STD screening -does not need One partner/husband  No pain with intercourse   No hot flashes or night sweats   Mammogram 8/21 -normal  Self breast exam - no lumps or changes but does not always check  Chronic breast pain - improved since last visit  Soreness after mammogram   Colonoscopy 11/18   Neg screen for hep C and HIV in 2018  BP Readings from Last 3 Encounters:  08/14/20 114/60  03/28/20 118/70  01/04/20 112/72   Pulse Readings from Last 3 Encounters:  08/14/20 74  03/28/20 73  01/04/20 69    Cholesterol Lab Results  Component Value Date   CHOL 181 08/07/2020   CHOL 195 07/06/2019   CHOL 187 04/03/2018   Lab Results  Component Value Date   HDL 57.70 08/07/2020   HDL 72  07/06/2019   HDL 64 04/03/2018   Lab Results  Component Value Date   LDLCALC 105 (H) 08/07/2020   LDLCALC 105 (H) 07/06/2019   LDLCALC 106 (H) 04/03/2018   Lab Results  Component Value Date   TRIG 91.0 08/07/2020   TRIG 89 07/06/2019   TRIG 76 04/03/2018   Lab Results  Component Value Date   CHOLHDL 3 08/07/2020   CHOLHDL 2.7 07/06/2019   CHOLHDL 2.9 04/03/2018   Lab Results  Component Value Date   LDLDIRECT 123.8 10/13/2008   HDL is down some from a year ago  Is still good  When she was in [place] she ate poorly  Now back to meal prep   Exercise- 5 days per week 30 min of cardio and 30 min of resistance training  Takes D3 for bones and ca/mag/zinc   Other labs: Results for orders placed or performed in visit on 08/07/20  TSH  Result Value Ref Range   TSH 2.72 0.35 - 4.50 uIU/mL  Lipid panel  Result Value Ref Range   Cholesterol 181 0 - 200 mg/dL   Triglycerides 91.0 0 - 149 mg/dL   HDL 57.70 >39.00 mg/dL   VLDL 18.2 0.0 - 40.0 mg/dL   LDL Cholesterol 105 (H) 0 -  99 mg/dL   Total CHOL/HDL Ratio 3    NonHDL 123.56   Comprehensive metabolic panel  Result Value Ref Range   Sodium 138 135 - 145 mEq/L   Potassium 3.9 3.5 - 5.1 mEq/L   Chloride 104 96 - 112 mEq/L   CO2 28 19 - 32 mEq/L   Glucose, Bld 82 70 - 99 mg/dL   BUN 13 6 - 23 mg/dL   Creatinine, Ser 0.77 0.40 - 1.20 mg/dL   Total Bilirubin 0.6 0.2 - 1.2 mg/dL   Alkaline Phosphatase 48 39 - 117 U/L   AST 12 0 - 37 U/L   ALT 11 0 - 35 U/L   Total Protein 6.5 6.0 - 8.3 g/dL   Albumin 3.9 3.5 - 5.2 g/dL   GFR 94.91 >60.00 mL/min   Calcium 9.3 8.4 - 10.5 mg/dL  CBC with Differential/Platelet  Result Value Ref Range   WBC 6.6 4.0 - 10.5 K/uL   RBC 4.28 3.87 - 5.11 Mil/uL   Hemoglobin 12.5 12.0 - 15.0 g/dL   HCT 37.8 36 - 46 %   MCV 88.5 78.0 - 100.0 fl   MCHC 33.0 30.0 - 36.0 g/dL   RDW 13.4 11.5 - 15.5 %   Platelets 336.0 150 - 400 K/uL   Neutrophils Relative % 57.3 43 - 77 %   Lymphocytes  Relative 29.2 12 - 46 %   Monocytes Relative 9.6 3 - 12 %   Eosinophils Relative 3.5 0 - 5 %   Basophils Relative 0.4 0 - 3 %   Neutro Abs 3.8 1.4 - 7.7 K/uL   Lymphs Abs 1.9 0.7 - 4.0 K/uL   Monocytes Absolute 0.6 0 - 1 K/uL   Eosinophils Absolute 0.2 0 - 0 K/uL   Basophils Absolute 0.0 0 - 0 K/uL     Patient Active Problem List   Diagnosis Date Noted  . Routine general medical examination at a health care facility 08/04/2020  . RUQ pain 01/04/2020  . Right flank pain 01/04/2020  . Left foot pain 08/17/2019  . Exposure to COVID-19 virus 07/26/2019  . Leg pain, lateral, left 12/03/2018  . Left knee pain 12/03/2018  . Skin lesion of right leg 11/09/2018  . Breast tenderness 06/30/2018  . Abdominal pain, generalized 08/13/2017  . Change in stool habits 08/13/2017  . Urticaria 06/11/2016  . Angioedema 06/11/2016  . Lip swelling 05/07/2016  . Lumbar pain 01/02/2016  . Gastritis 11/06/2015  . Folliculitis 42/59/5638  . Allergic rhinitis 03/20/2007  . GERD 03/20/2007   Past Medical History:  Diagnosis Date  . Allergic rhinitis   . 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 Genital warts   . GERD (gastroesophageal reflux disease)   . Hiatal hernia    EGD- gastritis, esophagitis 09/2004  . History of HPV infection   . Knee MCL sprain 2007   Past Surgical History:  Procedure Laterality Date  . LAPAROSCOPIC ASSISTED VAGINAL HYSTERECTOMY N/A 07/27/2013   Procedure: LAPAROSCOPIC ASSISTED VAGINAL HYSTERECTOMY;  Surgeon: Anastasio Auerbach, MD for leiomyomata   Social History   Tobacco Use  . Smoking status: Never Smoker  . Smokeless tobacco: Never Used  Vaping Use  . Vaping Use: Never used  Substance Use Topics  . Alcohol use: No    Alcohol/week: 0.0 standard drinks  . Drug use: No   Family History  Problem Relation Age of Onset  . Aneurysm Father        cerebral  . Breast cancer  Maternal Aunt        50's  . Cancer Paternal Aunt        Uterine  . Colon cancer Neg  Hx   . Rectal cancer Neg Hx   . Esophageal cancer Neg Hx   . Stomach cancer Neg Hx   . Liver cancer Neg Hx    No Known Allergies Current Outpatient Medications on File Prior to Visit  Medication Sig Dispense Refill  . Ascorbic Acid (VITAMIN C PO) Take 1 capsule by mouth daily.    . B Complex-C-Folic Acid (SM B SUPER VITAMIN COMPLEX PO) Take 1 capsule by mouth daily.    Marland Kitchen CALCIUM PO Take 2 capsules by mouth daily.    Marland Kitchen CALCIUM-MAGNESIUM-ZINC PO Take 2 capsules by mouth daily.    . cetirizine (ZYRTEC) 10 MG tablet Take 10 mg by mouth as needed.     . Cholecalciferol (VITAMIN D3 PO) Take 2,000 Units by mouth daily.     . diphenhydrAMINE (BENADRYL) 50 MG capsule Take 25 mg by mouth as needed.     . vitamin E (VITAMIN E) 200 UNIT capsule Take 200 Units by mouth daily.     No current facility-administered medications on file prior to visit.      Review of Systems  Constitutional: Negative for activity change, appetite change, fatigue, fever and unexpected weight change.  HENT: Negative for congestion, ear pain, rhinorrhea, sinus pressure and sore throat.   Eyes: Positive for photophobia. Negative for pain, redness and visual disturbance.  Respiratory: Negative for cough, shortness of breath and wheezing.   Cardiovascular: Negative for chest pain and palpitations.  Gastrointestinal: Negative for abdominal pain, blood in stool, constipation and diarrhea.  Endocrine: Negative for polydipsia and polyuria.  Genitourinary: Negative for dysuria, frequency and urgency.  Musculoskeletal: Negative for arthralgias, back pain and myalgias.       Occ L leg pain over lateral hip -[name] and goes  Skin: Negative for pallor and rash.  Allergic/Immunologic: Negative for environmental allergies.  Neurological: Negative for dizziness, syncope and headaches.  Hematological: Negative for adenopathy. Does not bruise/bleed easily.  Psychiatric/Behavioral: Negative for decreased concentration and dysphoric  mood. The patient is not nervous/anxious.        Mood is good       Objective:   Physical Exam Constitutional:      General: She is not in acute distress.    Appearance: Normal appearance. She is well-developed and normal weight. She is not ill-appearing or diaphoretic.  HENT:     Head: Normocephalic and atraumatic.     Right Ear: Tympanic membrane, ear canal and external ear normal.     Left Ear: Tympanic membrane, ear canal and external ear normal.     Nose: Nose normal. No congestion.     Mouth/Throat:     Mouth: Mucous membranes are moist.     Pharynx: Oropharynx is clear. No posterior oropharyngeal erythema.  Eyes:     General: No scleral icterus.    Extraocular Movements: Extraocular movements intact.     Conjunctiva/sclera: Conjunctivae normal.     Pupils: Pupils are equal, round, and reactive to light.  Neck:     Thyroid: No thyromegaly.     Vascular: No carotid bruit or JVD.  Cardiovascular:     Rate and Rhythm: Normal rate and regular rhythm.     Pulses: Normal pulses.     Heart sounds: Normal heart sounds. No gallop.   Pulmonary:     Effort: Pulmonary effort  is normal. No respiratory distress.     Breath sounds: Normal breath sounds. No wheezing.     Comments: Good air exch Chest:     Chest wall: No tenderness.  Abdominal:     General: Bowel sounds are normal. There is no distension or abdominal bruit.     Palpations: Abdomen is soft. There is no mass.     Tenderness: There is no abdominal tenderness.     Hernia: No hernia is present.  Genitourinary:    Comments: Breast exam: No mass, nodules, thickening, tenderness, bulging, retraction, inflamation, nipple discharge or skin changes noted.  No axillary or clavicular LA.     Dense breast tissue bilaterally  Bimanual pelvic exam done w/o tenderness or abnormality Nl ext genitalia  Nl vaginal mucosa and no d/c Musculoskeletal:        General: No tenderness. Normal range of motion.     Cervical back: Normal  range of motion and neck supple. No rigidity. No muscular tenderness.     Right lower leg: No edema.     Left lower leg: No edema.  Lymphadenopathy:     Cervical: No cervical adenopathy.  Skin:    General: Skin is warm and dry.     Coloration: Skin is not pale.     Findings: No erythema or rash.  Neurological:     Mental Status: She is alert. Mental status is at baseline.     Cranial Nerves: No cranial nerve deficit.     Motor: No abnormal muscle tone.     Coordination: Coordination normal.     Gait: Gait normal.     Deep Tendon Reflexes: Reflexes are normal and symmetric. Reflexes normal.  Psychiatric:        Mood and Affect: Mood normal.        Cognition and Memory: Cognition and memory normal.     Comments: Pleasant            Assessment & Plan:   Problem List Items Addressed This Visit      Other   Leg pain, lateral, left    This [name] and goes/sometimes sharp  Works out regularly  Disc use of ice on upper lat thigh (trochanter) and also IT band stretching      Routine general medical examination at a health care facility - Primary    Reviewed health habits including diet and exercise and skin cancer prevention Reviewed appropriate screening tests for age  Also reviewed health mt list, fam hx and immunization status , as well as social and family history   See HPI Planning flu vaccine in the fall  Disc shingrix vaccine-interested if covered by ins covid staus-vaccinated  S/p hysterectomy and declines std screen  Nl bimanual exam

## 2020-11-14 ENCOUNTER — Ambulatory Visit: Payer: BC Managed Care – PPO | Attending: Internal Medicine

## 2020-11-14 DIAGNOSIS — Z23 Encounter for immunization: Secondary | ICD-10-CM

## 2020-11-14 NOTE — Progress Notes (Signed)
   Covid-19 Vaccination Clinic  Name:  Tempie Gibeault    MRN: [id number] DOB: June 17, 1966  11/14/2020  Ms. Mcelvain was observed post Covid-19 immunization for 30 minutes based on pre-vaccination screening without incident. She was provided with Vaccine Information Sheet and instruction to access the V-Safe system.   Ms. Criscione was instructed to call 911 with any severe reactions post vaccine: Marland Kitchen Difficulty breathing  . Swelling of face and throat  . A fast heartbeat  . A bad rash all over body  . Dizziness and weakness   Immunizations Administered    Name Date Dose VIS Date Route   Pfizer COVID-19 Vaccine 11/14/2020  1:33 PM 0.3 mL 10/11/2020 Intramuscular   Manufacturer: [place]   Lot: [id number]   [place]: [id number]

## 2020-11-24 ENCOUNTER — Telehealth: Payer: BC Managed Care – PPO | Admitting: Family Medicine

## 2020-12-21 DIAGNOSIS — Z20822 Contact with and (suspected) exposure to covid-19: Secondary | ICD-10-CM | POA: Diagnosis not present

## 2021-01-22 ENCOUNTER — Encounter: Payer: Self-pay | Admitting: Family Medicine

## 2021-01-22 ENCOUNTER — Ambulatory Visit (INDEPENDENT_AMBULATORY_CARE_PROVIDER_SITE_OTHER): Payer: BC Managed Care – PPO | Admitting: Family Medicine

## 2021-01-22 ENCOUNTER — Ambulatory Visit (INDEPENDENT_AMBULATORY_CARE_PROVIDER_SITE_OTHER)
Admission: RE | Admit: 2021-01-22 | Discharge: 2021-01-22 | Disposition: A | Discharge status: Home / Self Care | Payer: BC Managed Care – PPO | Source: Ambulatory Visit | Attending: Family Medicine | Admitting: Family Medicine

## 2021-01-22 ENCOUNTER — Other Ambulatory Visit: Payer: Self-pay

## 2021-01-22 VITALS — BP 134/80 | HR 65 | Temp 96.9°F | Ht 63.75 in | Wt 144.0 lb

## 2021-01-22 DIAGNOSIS — G8929 Other chronic pain: Secondary | ICD-10-CM

## 2021-01-22 DIAGNOSIS — M79645 Pain in left finger(s): Secondary | ICD-10-CM

## 2021-01-22 DIAGNOSIS — M25551 Pain in right hip: Secondary | ICD-10-CM | POA: Diagnosis not present

## 2021-01-22 NOTE — Assessment & Plan Note (Signed)
Tender over pelvis/top and lateral  Mild troch tenderness Nl rom of hips/ tight IT band  Suspect overuse from exercise  Suspect needs more stretching at ends of workout Handouts given  Will update if no imp of this and cross training

## 2021-01-22 NOTE — Progress Notes (Signed)
Subjective:    Patient ID: Anna [name], female    DOB: 08-07-1966, 55 y.o.   MRN: [id number]  This visit occurred during the SARS-CoV-2 public health emergency.  Safety protocols were in place, including screening questions prior to the visit, additional usage of staff PPE, and extensive cleaning of exam room while observing appropriate contact time as indicated for disinfecting solutions.    HPI  Pt presents with L thumb pain and R hip pain  Wt Readings from Last 3 Encounters:  01/22/21 144 lb (65.3 kg)  08/14/20 144 lb 2 oz (65.4 kg)  03/28/20 145 lb 2 oz (65.8 kg)   24.91 kg/m  R handed   L thumb started hurting 2 mo ago  She works out/lifts wt and types  Some days better than others  In thenar area /no swelling or redness  Dull ache - more sharp when moving  No triggering  Worse to flex thumb  No wrist pain  No numbness or tingling   Has used a compression glove   R hip -lateral  Stiff after sitting - dull pain over pelvic bone  No swelling  Walking bothers it   For lower body -does squats  Some hip abductor moves and kick backs  Some dead lifts 50 lb   Patient Active Problem List   Diagnosis Date Noted  . Hip pain, right 01/22/2021  . Chronic pain of left thumb 01/22/2021  . Routine general medical examination at a health care facility 08/04/2020  . Leg pain, lateral, left 12/03/2018  . Breast tenderness 06/30/2018  . Urticaria 06/11/2016  . Angioedema 06/11/2016  . Lip swelling 05/07/2016  . Allergic rhinitis 03/20/2007  . GERD 03/20/2007   Past Medical History:  Diagnosis Date  . Allergic rhinitis   . 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 Genital warts   . GERD (gastroesophageal reflux disease)   . Hiatal hernia    EGD- gastritis, esophagitis 09/2004  . History of HPV infection   . Knee MCL sprain 2007   Past Surgical History:  Procedure Laterality Date  . LAPAROSCOPIC ASSISTED VAGINAL HYSTERECTOMY N/A 07/27/2013   Procedure:  LAPAROSCOPIC ASSISTED VAGINAL HYSTERECTOMY;  Surgeon: Anastasio Auerbach, MD for leiomyomata   Social History   Tobacco Use  . Smoking status: Never Smoker  . Smokeless tobacco: Never Used  Vaping Use  . Vaping Use: Never used  Substance Use Topics  . Alcohol use: No    Alcohol/week: 0.0 standard drinks  . Drug use: No   Family History  Problem Relation Age of Onset  . Aneurysm Father        cerebral  . Breast cancer Maternal Aunt        50's  . Cancer Paternal Aunt        Uterine  . Colon cancer Neg Hx   . Rectal cancer Neg Hx   . Esophageal cancer Neg Hx   . Stomach cancer Neg Hx   . Liver cancer Neg Hx    No Known Allergies Current Outpatient Medications on File Prior to Visit  Medication Sig Dispense Refill  . Ascorbic Acid (VITAMIN C PO) Take 1 capsule by mouth daily.    . B Complex-C-Folic Acid (SM B SUPER VITAMIN COMPLEX PO) Take 1 capsule by mouth daily.    Marland Kitchen CALCIUM-MAGNESIUM-ZINC PO Take 2 capsules by mouth daily.    . cetirizine (ZYRTEC) 10 MG tablet Take 10 mg by mouth as needed.     Marland Kitchen  Cholecalciferol (VITAMIN D3 PO) Take 2,000 Units by mouth daily.     . diphenhydrAMINE (BENADRYL) 50 MG capsule Take 25 mg by mouth as needed.     . vitamin E 200 UNIT capsule Take 200 Units by mouth daily.     No current facility-administered medications on file prior to visit.      Review of Systems  Constitutional: Negative for activity change, appetite change, fatigue, fever and unexpected weight change.  HENT: Negative for congestion, ear pain, rhinorrhea, sinus pressure and sore throat.   Eyes: Negative for pain, redness and visual disturbance.  Respiratory: Negative for cough, shortness of breath and wheezing.   Cardiovascular: Negative for chest pain and palpitations.  Gastrointestinal: Negative for abdominal pain, blood in stool, constipation and diarrhea.  Endocrine: Negative for polydipsia and polyuria.  Genitourinary: Negative for dysuria, frequency and  urgency.  Musculoskeletal: Positive for arthralgias. Negative for back pain and myalgias.  Skin: Negative for pallor and rash.  Allergic/Immunologic: Negative for environmental allergies.  Neurological: Negative for dizziness, syncope and headaches.  Hematological: Negative for adenopathy. Does not bruise/bleed easily.  Psychiatric/Behavioral: Negative for decreased concentration and dysphoric mood. The patient is not nervous/anxious.        Objective:   Physical Exam Constitutional:      General: She is not in acute distress.    Appearance: Normal appearance. She is normal weight. She is not ill-appearing.  Eyes:     Conjunctiva/sclera: Conjunctivae normal.     Pupils: Pupils are equal, round, and reactive to light.  Cardiovascular:     Rate and Rhythm: Normal rate and regular rhythm.  Pulmonary:     Effort: Pulmonary effort is normal.  Abdominal:     General: Abdomen is flat. Bowel sounds are normal. There is no distension.     Palpations: Abdomen is soft. There is no mass.     Tenderness: There is no abdominal tenderness.     Hernia: No hernia is present.  Musculoskeletal:     Cervical back: Normal range of motion and neck supple. No tenderness.     Right hip: Tenderness and bony tenderness present. Normal range of motion.     Left lower leg: No edema.     Comments: R hip- tender over anterior/superior iliac spine and iliac crest  Nl LS exam and nl rom hips and spine Tight IT bands bilat Mildly tender greater trochanter   L hand-tender at carpal-metacarpal joint w/o deformity or swelling or skin change Nl rom fingers and thumb and wrist  Some pain with cross over of thumb No triggering  Skin:    Coloration: Skin is not pale.     Findings: No bruising, erythema or rash.  Neurological:     Mental Status: She is alert.     Motor: No weakness.     Coordination: Coordination normal.     Deep Tendon Reflexes: Reflexes normal.  Psychiatric:        Mood and Affect: Mood  normal.           Assessment & Plan:   Problem List Items Addressed This Visit      Other   Hip pain, right    Tender over pelvis/top and lateral  Mild troch tenderness Nl rom of hips/ tight IT band  Suspect overuse from exercise  Suspect needs more stretching at ends of workout Handouts given  Will update if no imp of this and cross training        Chronic pain of  left thumb - Primary    Pain at carpal-metacarpal joint  Neg finkelstein's test  Nl rom fingers  Pain with thumb crossover  Suspect OA or tendonitis -xr today      Relevant Orders   DG Hand Complete Left

## 2021-01-22 NOTE — Patient Instructions (Addendum)
Start a work out with a warm up  Then work out  Then stretch (more prone to injury without it)   Yoga is great ( a great way to mix things up) -but you still need to warm up   I think you have either have arthritis or tendonitis in your left thumb   Xray today   Compression glove for thumb is good Ice/heat  Take breaks from typing   Xray of hand today

## 2021-01-22 NOTE — Assessment & Plan Note (Signed)
Pain at carpal-metacarpal joint  Neg finkelstein's test  Nl rom fingers  Pain with thumb crossover  Suspect OA or tendonitis -xr today

## 2021-02-01 ENCOUNTER — Encounter: Payer: Self-pay | Admitting: *Deleted

## 2021-02-28 ENCOUNTER — Encounter: Payer: Self-pay | Admitting: Family Medicine

## 2021-02-28 ENCOUNTER — Ambulatory Visit (INDEPENDENT_AMBULATORY_CARE_PROVIDER_SITE_OTHER): Payer: BC Managed Care – PPO | Admitting: Family Medicine

## 2021-02-28 ENCOUNTER — Other Ambulatory Visit: Payer: Self-pay

## 2021-02-28 DIAGNOSIS — S161XXA Strain of muscle, fascia and tendon at neck level, initial encounter: Secondary | ICD-10-CM | POA: Diagnosis not present

## 2021-02-28 NOTE — Patient Instructions (Addendum)
I think you have strained muscles in your neck   Make sure your pillow fits you well, some have memory foam to fit you   Try to change position for holding the baby   Stretch  Use heat to relax muscles voltaren gel is ok   Update if not starting to improve in a week or if worsening

## 2021-02-28 NOTE — Assessment & Plan Note (Signed)
L sided neck strain - may be from work outs or holding a baby on one side  Reassuring exam without neurol signs or symptoms  Adv use of heat/ice  Also voltaren gel  Look for a memory foam pillow for cervical support  Analgesics prn  Given handout with cervical rehab stretches and reviewed Update if not starting to improve in a week or if worsening   Consider PT in future if needed

## 2021-02-28 NOTE — Progress Notes (Signed)
Subjective:    Patient ID: Anna [name], female    DOB: 17-Dec-1966, 55 y.o.   MRN: [id number]  This visit occurred during the SARS-CoV-2 public health emergency.  Safety protocols were in place, including screening questions prior to the visit, additional usage of staff PPE, and extensive cleaning of exam room while observing appropriate contact time as indicated for disinfecting solutions.    HPI Pt presents for neck pain   Wt Readings from Last 3 Encounters:  02/28/21 145 lb 2 oz (65.8 kg)  01/22/21 144 lb (65.3 kg)  08/14/20 144 lb 2 oz (65.4 kg)   25.11 kg/m  Several days of L sided neck pain  Radiates down to shoulder  Woke up with a "knot" in the back of her neck Now bilateral but worse on L   Hurts more to extend neck  Rotation is not bad   Lifts weights/uses machines -but not since last week  Has been holding a baby (grandson) about 20 lb   She used some voltaren gel  No heat or ice  No oral meds   She still has some pain in L leg/burning  No back pain  Not worse but still there intermittently  Not stopping her activities   Patient Active Problem List   Diagnosis Date Noted  . Neck strain 02/28/2021  . Hip pain, right 01/22/2021  . Chronic pain of left thumb 01/22/2021  . Routine general medical examination at a health care facility 08/04/2020  . Leg pain, lateral, left 12/03/2018  . Breast tenderness 06/30/2018  . Urticaria 06/11/2016  . Angioedema 06/11/2016  . Lip swelling 05/07/2016  . Allergic rhinitis 03/20/2007  . GERD 03/20/2007   Past Medical History:  Diagnosis Date  . Allergic rhinitis   . 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 Genital warts   . GERD (gastroesophageal reflux disease)   . Hiatal hernia    EGD- gastritis, esophagitis 09/2004  . History of HPV infection   . Knee MCL sprain 2007   Past Surgical History:  Procedure Laterality Date  . LAPAROSCOPIC ASSISTED VAGINAL HYSTERECTOMY N/A 07/27/2013   Procedure:  LAPAROSCOPIC ASSISTED VAGINAL HYSTERECTOMY;  Surgeon: Anastasio Auerbach, MD for leiomyomata   Social History   Tobacco Use  . Smoking status: Never Smoker  . Smokeless tobacco: Never Used  Vaping Use  . Vaping Use: Never used  Substance Use Topics  . Alcohol use: No    Alcohol/week: 0.0 standard drinks  . Drug use: No   Family History  Problem Relation Age of Onset  . Aneurysm Father        cerebral  . Breast cancer Maternal Aunt        50's  . Cancer Paternal Aunt        Uterine  . Colon cancer Neg Hx   . Rectal cancer Neg Hx   . Esophageal cancer Neg Hx   . Stomach cancer Neg Hx   . Liver cancer Neg Hx    No Known Allergies Current Outpatient Medications on File Prior to Visit  Medication Sig Dispense Refill  . Ascorbic Acid (VITAMIN C PO) Take 1 capsule by mouth daily.    . B Complex-C-Folic Acid (SM B SUPER VITAMIN COMPLEX PO) Take 1 capsule by mouth daily.    Marland Kitchen CALCIUM-MAGNESIUM-ZINC PO Take 2 capsules by mouth daily.    . cetirizine (ZYRTEC) 10 MG tablet Take 10 mg by mouth as needed.     . Cholecalciferol (VITAMIN  D3 PO) Take 2,000 Units by mouth daily.     . diphenhydrAMINE (BENADRYL) 50 MG capsule Take 25 mg by mouth as needed.     . vitamin E 200 UNIT capsule Take 200 Units by mouth daily.     No current facility-administered medications on file prior to visit.    Review of Systems  Constitutional: Negative for activity change, appetite change, fatigue, fever and unexpected weight change.  HENT: Negative for congestion, ear pain, rhinorrhea, sinus pressure and sore throat.   Eyes: Negative for pain, redness and visual disturbance.  Respiratory: Negative for cough, shortness of breath and wheezing.   Cardiovascular: Negative for chest pain and palpitations.  Gastrointestinal: Negative for abdominal pain, blood in stool, constipation and diarrhea.  Endocrine: Negative for polydipsia and polyuria.  Genitourinary: Negative for dysuria, frequency and urgency.   Musculoskeletal: Positive for neck pain and neck stiffness. Negative for arthralgias, back pain and myalgias.  Skin: Negative for pallor and rash.  Allergic/Immunologic: Negative for environmental allergies.  Neurological: Negative for dizziness, syncope and headaches.  Hematological: Negative for adenopathy. Does not bruise/bleed easily.  Psychiatric/Behavioral: Negative for decreased concentration and dysphoric mood. The patient is not nervous/anxious.        Objective:   Physical Exam Constitutional:      General: She is not in acute distress.    Appearance: Normal appearance. She is normal weight. She is not ill-appearing.  Eyes:     General: No scleral icterus.    Conjunctiva/sclera: Conjunctivae normal.     Pupils: Pupils are equal, round, and reactive to light.  Neck:     Vascular: No carotid bruit.  Musculoskeletal:     Cervical back: Neck supple. Spasms and tenderness present. No swelling, deformity, rigidity, torticollis, bony tenderness or crepitus. Pain with movement present. Decreased range of motion.     Comments: L sided cervical muscular tenderness that progresses to L scapular area Some tight musculature No bony tenderness  Pain to fully rotate/tilt L   No neuro changes   Lymphadenopathy:     Cervical: No cervical adenopathy.  Skin:    Findings: No erythema or rash.  Neurological:     Mental Status: She is alert.     Cranial Nerves: No cranial nerve deficit.     Sensory: No sensory deficit.     Motor: No weakness.     Coordination: Coordination normal.     Deep Tendon Reflexes: Reflexes normal.  Psychiatric:        Mood and Affect: Mood normal.           Assessment & Plan:   Problem List Items Addressed This Visit      Musculoskeletal and Integument   Neck strain    L sided neck strain - may be from work outs or holding a baby on one side  Reassuring exam without neurol signs or symptoms  Adv use of heat/ice  Also voltaren gel  Look for a  memory foam pillow for cervical support  Analgesics prn  Given handout with cervical rehab stretches and reviewed Update if not starting to improve in a week or if worsening   Consider PT in future if needed

## 2021-03-01 DIAGNOSIS — Z20822 Contact with and (suspected) exposure to covid-19: Secondary | ICD-10-CM | POA: Diagnosis not present

## 2021-03-02 DIAGNOSIS — Z03818 Encounter for observation for suspected exposure to other biological agents ruled out: Secondary | ICD-10-CM | POA: Diagnosis not present

## 2021-03-02 DIAGNOSIS — Z20822 Contact with and (suspected) exposure to covid-19: Secondary | ICD-10-CM | POA: Diagnosis not present

## 2021-05-01 ENCOUNTER — Ambulatory Visit (INDEPENDENT_AMBULATORY_CARE_PROVIDER_SITE_OTHER)
Admission: RE | Admit: 2021-05-01 | Discharge: 2021-05-01 | Disposition: A | Discharge status: Home / Self Care | Payer: BC Managed Care – PPO | Source: Ambulatory Visit | Attending: Family Medicine | Admitting: Family Medicine

## 2021-05-01 ENCOUNTER — Ambulatory Visit (INDEPENDENT_AMBULATORY_CARE_PROVIDER_SITE_OTHER): Payer: BC Managed Care – PPO | Admitting: Family Medicine

## 2021-05-01 ENCOUNTER — Other Ambulatory Visit: Payer: Self-pay | Admitting: Family Medicine

## 2021-05-01 ENCOUNTER — Other Ambulatory Visit: Payer: Self-pay

## 2021-05-01 ENCOUNTER — Encounter: Payer: Self-pay | Admitting: Family Medicine

## 2021-05-01 DIAGNOSIS — M898X2 Other specified disorders of bone, upper arm: Secondary | ICD-10-CM | POA: Diagnosis not present

## 2021-05-01 DIAGNOSIS — M25519 Pain in unspecified shoulder: Secondary | ICD-10-CM | POA: Insufficient documentation

## 2021-05-01 DIAGNOSIS — M25511 Pain in right shoulder: Secondary | ICD-10-CM

## 2021-05-01 DIAGNOSIS — M19011 Primary osteoarthritis, right shoulder: Secondary | ICD-10-CM | POA: Diagnosis not present

## 2021-05-01 DIAGNOSIS — M79621 Pain in right upper arm: Secondary | ICD-10-CM | POA: Diagnosis not present

## 2021-05-01 NOTE — Progress Notes (Signed)
Subjective:    Patient ID: Anna [name], female    DOB: Feb 02, 1966, 55 y.o.   MRN: [id number]  This visit occurred during the SARS-CoV-2 public health emergency.  Safety protocols were in place, including screening questions prior to the visit, additional usage of staff PPE, and extensive cleaning of exam room while observing appropriate contact time as indicated for disinfecting solutions.    HPI Pt presents for R shoulder and arm pain   Wt Readings from Last 3 Encounters:  05/01/21 144 lb 6 oz (65.5 kg)  02/28/21 145 lb 2 oz (65.8 kg)  01/22/21 144 lb (65.3 kg)   24.98 kg/m  Not sure if this is related to the neck pain she has last time Arm right -hurts to lift it and to push or pull  Uncomfortable to work overhead/reach above her  Pain goes all the way to her fingers   Pain is worst in high forearm / a little in shoulder  Sometimes posterior  Neck movement does cause it  Hand movement does not worsen it   Neck discomfort is gone on the L  A little on the R  Uncomfortable to lie on R side   Has not used weights in right arm for the last 2 weeks No trauma  Is R handed   No longer holding a baby as much (that was out of town)   No skin change No swelling  Has creaky joints   Tried a little voltaren gel -not long enough to tell if it works  Patient Active Problem List   Diagnosis Date Noted  . Shoulder pain 05/01/2021  . Neck strain 02/28/2021  . Hip pain, right 01/22/2021  . Chronic pain of left thumb 01/22/2021  . Routine general medical examination at a health care facility 08/04/2020  . Leg pain, lateral, left 12/03/2018  . Breast tenderness 06/30/2018  . Urticaria 06/11/2016  . Angioedema 06/11/2016  . Lip swelling 05/07/2016  . Allergic rhinitis 03/20/2007  . GERD 03/20/2007   Past Medical History:  Diagnosis Date  . Allergic rhinitis   . 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 Genital warts   . GERD (gastroesophageal reflux  disease)   . Hiatal hernia    EGD- gastritis, esophagitis 09/2004  . History of HPV infection   . Knee MCL sprain 2007   Past Surgical History:  Procedure Laterality Date  . LAPAROSCOPIC ASSISTED VAGINAL HYSTERECTOMY N/A 07/27/2013   Procedure: LAPAROSCOPIC ASSISTED VAGINAL HYSTERECTOMY;  Surgeon: Anastasio Auerbach, MD for leiomyomata   Social History   Tobacco Use  . Smoking status: Never Smoker  . Smokeless tobacco: Never Used  Vaping Use  . Vaping Use: Never used  Substance Use Topics  . Alcohol use: No    Alcohol/week: 0.0 standard drinks  . Drug use: No   Family History  Problem Relation Age of Onset  . Aneurysm Father        cerebral  . Breast cancer Maternal Aunt        50's  . Cancer Paternal Aunt        Uterine  . Colon cancer Neg Hx   . Rectal cancer Neg Hx   . Esophageal cancer Neg Hx   . Stomach cancer Neg Hx   . Liver cancer Neg Hx    No Known Allergies Current Outpatient Medications on File Prior to Visit  Medication Sig Dispense Refill  . Ascorbic Acid (VITAMIN C PO) Take 1 capsule by mouth  daily.    . B Complex-C-Folic Acid (SM B SUPER VITAMIN COMPLEX PO) Take 1 capsule by mouth daily.    Marland Kitchen CALCIUM-MAGNESIUM-ZINC PO Take 2 capsules by mouth daily.    . cetirizine (ZYRTEC) 10 MG tablet Take 10 mg by mouth as needed.     . Cholecalciferol (VITAMIN D3 PO) Take 2,000 Units by mouth daily.     . diphenhydrAMINE (BENADRYL) 50 MG capsule Take 25 mg by mouth as needed.     . vitamin E 200 UNIT capsule Take 200 Units by mouth daily.     No current facility-administered medications on file prior to visit.     Review of Systems  Constitutional: Negative for activity change, appetite change, fatigue, fever and unexpected weight change.  HENT: Negative for congestion, ear pain, rhinorrhea, sinus pressure and sore throat.   Eyes: Negative for pain, redness and visual disturbance.  Respiratory: Negative for cough, shortness of breath and wheezing.    Cardiovascular: Negative for chest pain and palpitations.  Gastrointestinal: Negative for abdominal pain, blood in stool, constipation and diarrhea.  Endocrine: Negative for polydipsia and polyuria.  Genitourinary: Negative for dysuria, frequency and urgency.  Musculoskeletal: Negative for arthralgias, back pain and myalgias.       Right arm pain   Skin: Negative for pallor and rash.  Allergic/Immunologic: Negative for environmental allergies.  Neurological: Negative for dizziness, syncope and headaches.  Hematological: Negative for adenopathy. Does not bruise/bleed easily.  Psychiatric/Behavioral: Negative for decreased concentration and dysphoric mood. The patient is not nervous/anxious.        Objective:   Physical Exam Constitutional:      General: She is not in acute distress.    Appearance: Normal appearance. She is normal weight. She is not ill-appearing.  Eyes:     Conjunctiva/sclera: Conjunctivae normal.     Pupils: Pupils are equal, round, and reactive to light.  Neck:     Vascular: No carotid bruit.     Comments: Neck movement does not worsen arm pain Cardiovascular:     Rate and Rhythm: Normal rate and regular rhythm.     Heart sounds: Normal heart sounds.  Pulmonary:     Effort: Pulmonary effort is normal. No respiratory distress.     Breath sounds: Normal breath sounds. No wheezing.  Musculoskeletal:     Right shoulder: Tenderness and bony tenderness present. No swelling, deformity or crepitus. Normal range of motion. Normal strength. Normal pulse.     Right upper arm: No edema, deformity, tenderness or bony tenderness.     Cervical back: Normal range of motion and neck supple. No rigidity or tenderness.     Comments: R shoulder- nl hawking maneuver and some mild discomfort with NEER Nl abduction and int/ext rotation  Mild tenderness around proximal bicep tendon and acromion  No signs of impingement No biceps muscle tenderness   Lymphadenopathy:     Cervical:  No cervical adenopathy.  Skin:    Coloration: Skin is not pale.     Findings: No erythema or rash.  Neurological:     Mental Status: She is alert.     Sensory: No sensory deficit.     Coordination: Coordination normal.     Deep Tendon Reflexes: Reflexes normal.  Psychiatric:        Mood and Affect: Mood normal.           Assessment & Plan:   Problem List Items Addressed This Visit      Other   Shoulder pain  Pain in anterior shoulder that travels down arm  Wore in mid humeral area  Fairly good rom- some discomfort with full abduction of RUE Exam consistent with bicep tendonitis Adv use of votaren gel and ice  rom exercises  Xray now-pending report , may consider PT      Relevant Orders   DG Shoulder Right

## 2021-05-01 NOTE — Assessment & Plan Note (Signed)
Pain in anterior shoulder that travels down arm  Wore in mid humeral area  Fairly good rom- some discomfort with full abduction of RUE Exam consistent with bicep tendonitis Adv use of votaren gel and ice  rom exercises  Xray now-pending report , may consider PT

## 2021-05-01 NOTE — Patient Instructions (Addendum)
Xray now   Use voltaren gel - a nickel size amount to shoulder and bicep area up to four times daily   Also ice for 10 minutes on painful area when you can   When we get xray result- I may consider sending you to PT  ? Bicep tendonitis  (see the handout)

## 2021-05-06 ENCOUNTER — Telehealth: Payer: Self-pay | Admitting: Family Medicine

## 2021-05-06 DIAGNOSIS — M25511 Pain in right shoulder: Secondary | ICD-10-CM

## 2021-05-06 NOTE — Telephone Encounter (Signed)
-----   Message from Tammi Sou, [place] sent at 05/04/2021  4:33 PM EDT ----- Pt notified of xray results and Dr. Marliss Coots comments, pt has been using Voltaren gel and it's been helping some. Pt agrees with PT would like to see someone in [place], I advise PCP will put referral in and their office will call to schedule

## 2021-05-23 ENCOUNTER — Ambulatory Visit: Payer: BC Managed Care – PPO | Admitting: Rehabilitative and Restorative Service Providers"

## 2021-05-23 ENCOUNTER — Other Ambulatory Visit: Payer: Self-pay

## 2021-05-23 ENCOUNTER — Encounter: Payer: Self-pay | Admitting: Rehabilitative and Restorative Service Providers"

## 2021-05-23 DIAGNOSIS — M25611 Stiffness of right shoulder, not elsewhere classified: Secondary | ICD-10-CM | POA: Diagnosis not present

## 2021-05-23 DIAGNOSIS — M6281 Muscle weakness (generalized): Secondary | ICD-10-CM | POA: Diagnosis not present

## 2021-05-23 DIAGNOSIS — M25511 Pain in right shoulder: Secondary | ICD-10-CM | POA: Diagnosis not present

## 2021-05-23 NOTE — Patient Instructions (Signed)
Access Code: [place] URL: [email or web address] Date: 05/23/2021 Prepared by: Vista Mink  Exercises Single Arm Serratus Punches in Supine with Dumbbell - 2 x daily - 7 x weekly - 1 sets - 20 reps - 3 seconds hold Supine Shoulder Internal Rotation Stretch - 2 x daily - 7 x weekly - 1 sets - 10-20 reps - 10 seconds hold Supine Shoulder External Rotation Stretch - 2 x daily - 7 x weekly - 1 sets - 10-20 reps - 10 seconds hold Sidelying Shoulder External Rotation Dumbbell - 2 x daily - 7 x weekly - 1-2 sets - 10 reps Standing Scapular Retraction - 5 x daily - 7 x weekly - 1 sets - 5 reps - 5 second hold

## 2021-05-23 NOTE — Therapy (Signed)
[facility] Therapy [address] [place], [place], [address] Phone: [phone]   Fax:  [phone]  Physical Therapy Evaluation  Patient Details  Name: Anna [name] MRN: [id number] Date of Birth: 03-Jan-1966 Referring Provider (PT): Abner Greenspan MD   Encounter Date: 05/23/2021   PT End of Session - 05/23/21 1635    Visit Number 1    Number of Visits 6    Date for PT Re-Evaluation 07/04/21    PT Start Time 6803    PT Stop Time 1630    PT Time Calculation (min) 55 min    Activity Tolerance Patient tolerated treatment well;No increased pain    Behavior During Therapy WFL for tasks assessed/performed           Past Medical History:  Diagnosis Date  . Allergic rhinitis   . 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 Genital warts   . GERD (gastroesophageal reflux disease)   . Hiatal hernia    EGD- gastritis, esophagitis 09/2004  . History of HPV infection   . Knee MCL sprain 2007    Past Surgical History:  Procedure Laterality Date  . LAPAROSCOPIC ASSISTED VAGINAL HYSTERECTOMY N/A 07/27/2013   Procedure: LAPAROSCOPIC ASSISTED VAGINAL HYSTERECTOMY;  Surgeon: Anastasio Auerbach, MD for leiomyomata    There were no vitals filed for this visit.    Subjective Assessment - 05/23/21 1633    Subjective Anna [name] has had annoying shoulder pain for about 6 weeks.  No particular onset and things seem to not be getting better.    Patient Stated Goals Return to the gym and working out    Currently in Pain? Yes    Pain Score 3     Pain Location Shoulder    Pain Orientation Right    Pain Descriptors / Indicators Aching;Discomfort    Pain Type Acute pain    Pain Radiating Towards Lateral arm    Pain Onset More than a month ago    Pain Frequency Intermittent    Aggravating Factors  Impingement type postures    Pain Relieving Factors Rest    Effect of Pain on Daily Activities Limits how aggressively she can workout    Multiple Pain Sites No               [facility] PT Assessment - 05/23/21 0001      Assessment   Medical Diagnosis R shoulder pain    Referring Provider (PT) Wynelle Fanny Tower MD    Onset Date/Surgical Date --   1.5 months duration   Hand Dominance Right      Balance Screen   Has the patient fallen in the past 6 months No    Has the patient had a decrease in activity level because of a fear of falling?  No    Is the patient reluctant to leave their home because of a fear of falling?  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    Leisure Workout and travel      Cognition   Overall Cognitive Status Within Functional Limits for tasks assessed      Observation/Other Assessments   Focus on Therapeutic Outcomes (FOTO)  57 (Goal 76)      ROM / Strength   AROM / PROM / Strength AROM;Strength      AROM   Overall AROM  Deficits  AROM Assessment Site Shoulder    Right/Left Shoulder Left;Right    Right Shoulder Flexion 150 Degrees    Right Shoulder Internal Rotation 50 Degrees    Right Shoulder External Rotation 70 Degrees    Right Shoulder Horizontal  ADduction 40 Degrees    Left Shoulder Flexion 170 Degrees    Left Shoulder Internal Rotation 50 Degrees    Left Shoulder External Rotation 90 Degrees    Left Shoulder Horizontal ADduction 45 Degrees      Strength   Overall Strength Deficits    Strength Assessment Site Shoulder    Right/Left Shoulder Left;Right    Right Shoulder Internal Rotation --   25.2 pounds   Right Shoulder External Rotation --   15.9 pounds   Left Shoulder Internal Rotation --   30.8 pounds   Left Shoulder External Rotation --   17.4 pounds                     Objective measurements completed on examination: See above findings.       [facility] Adult PT Treatment/Exercise - 05/23/21 0001      Exercises   Exercises Shoulder      Shoulder Exercises: Supine   Protraction  Strengthening;Both;20 reps    Protraction Weight (lbs) 5#    External Rotation AROM;Right;10 reps;Limitations    External Rotation Limitations 10 seconds    Internal Rotation AROM;Right;10 reps;Limitations    Internal Rotation Limitations 10 seconds      Shoulder Exercises: Sidelying   External Rotation Strengthening;Right;10 reps;Weights    External Rotation Weight (lbs) 2#      Shoulder Exercises: Standing   Retraction Strengthening;Both;10 reps;Limitations    Retraction Limitations 5 seconds                  PT Education - 05/23/21 1635    Education Details Reviewed exam findings, HEP and answered questions about her gym program.    Person(s) Educated Patient    Methods Explanation;Tactile cues;Demonstration;Verbal cues;Handout    Comprehension Verbal cues required;Need further instruction;Returned demonstration;Verbalized understanding;Tactile cues required            PT Short Term Goals - 05/23/21 1638      PT SHORT TERM GOAL #1   Title [name] will report R shoulder pain consistently 0-2/10 on the Numeric Pain Rating Scale.    Time 3    Period Weeks    Status New    Target Date 06/13/21             PT Long Term Goals - 05/23/21 1639      PT LONG TERM GOAL #1   Title Improve FOTO score to 76.    Baseline 57    Time 6    Period Weeks    Status New    Target Date 07/04/21      PT LONG TERM GOAL #2   Title Improve R shoulder AROM for flexion to 170; ER to 90 and IR to 60 degrees.    Baseline 150; 70; 50 respectively    Time 6    Period Weeks    Status New    Target Date 07/04/21      PT LONG TERM GOAL #3   Title Improve R shoulder IR strength to at least 30 pounds and ER strength to at least 21 pounds.    Baseline 25 and 15 respectively    Time 6    Period Weeks    Status New  Target Date 07/04/21      PT LONG TERM GOAL #4   Title Anna [name] will be independent and compliant with her DC HEP.    Time 6    Period Weeks    Status New     Target Date 07/04/21                  Plan - 05/23/21 1636    Clinical Impression Statement Anna [name] has signs and symptoms consistent with a R shoulder impingement.  Stretching her tight capsule, strengthening her scapular stabilizers and rotator cuff should allow her to return to her previous level of function.    Examination-Activity Limitations Reach Overhead    Examination-Participation Restrictions Community Activity    Stability/Clinical Decision Making Stable/Uncomplicated    Clinical Decision Making Low    Rehab Potential Good    PT Frequency 1x / week    PT Duration 6 weeks    PT Treatment/Interventions ADLs/Self Care Home Management;Cryotherapy;Therapeutic activities;Therapeutic exercise;Neuromuscular re-education;Patient/family education;Manual techniques;Passive range of motion;Vasopneumatic Device    PT Next Visit Plan Progress scapular and rotator cuff strength, assess AROM progress    PT Home Exercise Plan Access Code: [place]  URL: [email or web address]  Date: 05/23/2021  Prepared by: Vista Mink    Exercises  Single Arm Serratus Punches in Supine with Dumbbell - 2 x daily - 7 x weekly - 1 sets - 20 reps - 3 seconds hold  Supine Shoulder Internal Rotation Stretch - 2 x daily - 7 x weekly - 1 sets - 10-20 reps - 10 seconds hold  Supine Shoulder External Rotation Stretch - 2 x daily - 7 x weekly - 1 sets - 10-20 reps - 10 seconds hold  Sidelying Shoulder External Rotation Dumbbell - 2 x daily - 7 x weekly - 1-2 sets - 10 reps  Standing Scapular Retraction - 5 x daily - 7 x weekly - 1 sets - 5 reps - 5 second hold    Consulted and Agree with Plan of Care Patient           Patient will benefit from skilled therapeutic intervention in order to improve the following deficits and impairments:  Decreased range of motion,Decreased strength,Increased edema,Pain,Impaired UE functional use  Visit Diagnosis: Muscle weakness (generalized)  Stiffness of right  shoulder, not elsewhere classified  Acute pain of right shoulder     Problem List Patient Active Problem List   Diagnosis Date Noted  . Shoulder pain 05/01/2021  . Neck strain 02/28/2021  . Hip pain, right 01/22/2021  . Chronic pain of left thumb 01/22/2021  . Routine general medical examination at a health care facility 08/04/2020  . Leg pain, lateral, left 12/03/2018  . Breast tenderness 06/30/2018  . Urticaria 06/11/2016  . Angioedema 06/11/2016  . Lip swelling 05/07/2016  . Allergic rhinitis 03/20/2007  . GERD 03/20/2007    Farley Ly PT, MPT 05/23/2021, 4:42 PM  [facility] Physical Therapy [address] [place], [place], [address] Phone: [phone]   Fax:  [phone]  Name: Anna [name] MRN: [id number] Date of Birth: 1966/12/09

## 2021-05-30 ENCOUNTER — Encounter: Payer: BC Managed Care – PPO | Admitting: Physical Therapy

## 2021-05-30 ENCOUNTER — Encounter: Payer: BC Managed Care – PPO | Admitting: Rehabilitative and Restorative Service Providers"

## 2021-06-13 ENCOUNTER — Other Ambulatory Visit: Payer: Self-pay

## 2021-06-13 ENCOUNTER — Ambulatory Visit: Payer: BC Managed Care – PPO | Admitting: Rehabilitative and Restorative Service Providers"

## 2021-06-13 ENCOUNTER — Telehealth: Payer: Self-pay | Admitting: Rehabilitative and Restorative Service Providers"

## 2021-06-13 ENCOUNTER — Encounter: Payer: Self-pay | Admitting: Rehabilitative and Restorative Service Providers"

## 2021-06-13 DIAGNOSIS — M25511 Pain in right shoulder: Secondary | ICD-10-CM

## 2021-06-13 DIAGNOSIS — M25611 Stiffness of right shoulder, not elsewhere classified: Secondary | ICD-10-CM

## 2021-06-13 DIAGNOSIS — M6281 Muscle weakness (generalized): Secondary | ICD-10-CM | POA: Diagnosis not present

## 2021-06-13 NOTE — Telephone Encounter (Signed)
Anna [name] thought her appointment was at 3:30.  I'll see her late.

## 2021-06-13 NOTE — Therapy (Signed)
[facility] Physical Therapy [address] [place], [place], [address] Phone: [phone]   Fax:  [phone]  Physical Therapy Treatment  Patient Details  Name: Anna [name] MRN: [id number] Date of Birth: [birth year] Referring Provider (PT): Abner Greenspan MD   Encounter Date: 06/13/2021   PT End of Session - 06/13/21 1624     Visit Number 2    Number of Visits 6    Date for PT Re-Evaluation 07/04/21    PT Start Time 1536    PT Stop Time 1620    PT Time Calculation (min) 44 min    Activity Tolerance Patient tolerated treatment well;No increased pain    Behavior During Therapy [facility] for tasks assessed/performed             Past Medical History:  Diagnosis Date   Allergic rhinitis    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   Subjective Assessment - 06/13/21 1556     Subjective Anna [name] reports inconsistent HEP compliance.    Patient Stated Goals Return to the gym and working out    Currently in Pain? No/denies    Pain Score 0-No pain    Pain Location Shoulder    Pain Orientation Right    Pain Descriptors / Indicators Aching;Pressure    Pain Type Acute pain    Pain Radiating Towards Lateral arm    Pain Onset More than a month ago    Pain Frequency Intermittent    Aggravating Factors  Reaching in certain positions and weight-bearing    Pain Relieving Factors Rest    Effect of Pain on Daily Activities Limits normal workouts    Multiple Pain Sites No                [facility] PT Assessment - 06/13/21 0001        AROM   Right Shoulder Flexion 170 Degrees    Right Shoulder Internal Rotation 60 Degrees    Right Shoulder External Rotation 85 Degrees    Right Shoulder Horizontal  ADduction 40 Degrees                           [facility] Adult PT Treatment/Exercise - 06/13/21 0001       Exercises   Exercises Shoulder      Shoulder Exercises: Supine   Protraction Strengthening;Both;20 reps    Protraction Weight (lbs) 5#    External Rotation AROM;Right;10 reps;Limitations    External Rotation Limitations 10 seconds    Internal Rotation AROM;Right;10 reps;Limitations    Internal Rotation Limitations 10 seconds      Shoulder Exercises: Sidelying   External Rotation Strengthening;Right;10 reps;Weights    External Rotation Weight (lbs) 2#    External Rotation Limitations 2 sets      Shoulder Exercises: Standing   External Rotation Strengthening;Right;10 reps;Theraband;Limitations    Theraband Level (Shoulder External Rotation) Level 3 (Green)    External Rotation Limitations 2 sets with slow eccentrics    Internal Rotation Strengthening;Right;10 reps;Theraband;Limitations  Theraband Level (Shoulder Internal Rotation) Level 3 (Green)    Internal Rotation Limitations slow eccentrics    Retraction Strengthening;Both;10 reps;Limitations    Retraction Limitations 5 seconds                    PT Education - 06/13/21 1623     Education Details Reviewed and corrected HEP.  Added RTC strength progressions.    Person(s) Educated Patient    Methods Explanation;Demonstration;Tactile cues;Verbal cues;Handout    Comprehension Verbalized understanding;Tactile cues required;Need further instruction;Returned demonstration;Verbal cues required              PT Short Term Goals - 06/13/21 1623       PT SHORT TERM GOAL #1   Title [name] will report R shoulder pain consistently 0-2/10 on the Numeric Pain Rating Scale.    Time 3    Period Weeks    Status Partially Met     Target Date 06/13/21               PT Long Term Goals - 06/13/21 1623       PT LONG TERM GOAL #1   Title Improve FOTO score to 76.    Baseline 57    Time 6    Period Weeks    Status On-going      PT LONG TERM GOAL #2   Title Improve R shoulder AROM for flexion to 170; ER to 90 and IR to 60 degrees.    Baseline Only ER has not met LTG    Time 6    Period Weeks    Status Partially Met      PT LONG TERM GOAL #3   Title Improve R shoulder IR strength to at least 30 pounds and ER strength to at least 21 pounds.    Baseline 25 and 15 respectively    Time 6    Period Weeks    Status On-going      PT LONG TERM GOAL #4   Title Natash will be independent and compliant with her DC HEP.    Time 6    Period Weeks    Status On-going                   Plan - 06/13/21 1625     Clinical Impression Statement Even with inconsistent HEP compliance, [name] is making subjective and objective progress towards long-term goals.  Added RTC strength progressions to address impairments found at evaluation.  With better HEP compliance, I expect [name] to meet LTGs my mid-July.    Examination-Activity Limitations Reach Overhead    Examination-Participation Restrictions Community Activity    Stability/Clinical Decision Making Stable/Uncomplicated    Rehab Potential Good    PT Frequency 1x / week    PT Duration 6 weeks    PT Treatment/Interventions ADLs/Self Care Home Management;Cryotherapy;Therapeutic activities;Therapeutic exercise;Neuromuscular re-education;Patient/family education;Manual techniques;Passive range of motion;Vasopneumatic Device    PT Next Visit Plan Progress scapular and rotator cuff strength, assess strength progress    PT Home Exercise Plan Access Code: [place]  URL: [email or web address]  Date: 05/23/2021  Prepared by: Vista Mink    Exercises  Single Arm Serratus Punches in Supine with Dumbbell - 2 x daily - 7 x weekly - 1 sets - 20 reps - 3  seconds hold  Supine Shoulder Internal Rotation Stretch - 2 x daily - 7 x weekly - 1 sets - 10-20 reps - 10 seconds hold  Supine Shoulder External Rotation Stretch -  2 x daily - 7 x weekly - 1 sets - 10-20 reps - 10 seconds hold  Sidelying Shoulder External Rotation Dumbbell - 2 x daily - 7 x weekly - 1-2 sets - 10 reps  Standing Scapular Retraction - 5 x daily - 7 x weekly - 1 sets - 5 reps - 5 second hold    Consulted and Agree with Plan of Care Patient             Patient will benefit from skilled therapeutic intervention in order to improve the following deficits and impairments:  Decreased range of motion, Decreased strength, Increased edema, Pain, Impaired UE functional use  Visit Diagnosis: Muscle weakness (generalized)  Stiffness of right shoulder, not elsewhere classified  Acute pain of right shoulder     Problem List Patient Active Problem List   Diagnosis Date Noted   Shoulder pain 05/01/2021   Neck strain 02/28/2021   Hip pain, right 01/22/2021   Chronic pain of left thumb 01/22/2021   Routine general medical examination at a health care facility 08/04/2020   Leg pain, lateral, left 12/03/2018   Breast tenderness 06/30/2018   Urticaria 06/11/2016   Angioedema 06/11/2016   Lip swelling 05/07/2016   Allergic rhinitis 03/20/2007   GERD 03/20/2007    Farley Ly PT, MPT 06/13/2021, 4:28 PM  [facility] [address] [place], [place], [address] Phone: [phone]   Fax:  [phone]  Name: Merion Caton MRN: [id number] Date of Birth: 1966/07/23

## 2021-06-13 NOTE — Patient Instructions (Signed)
Access Code: [place] URL: [email or web address] Date: 06/13/2021 Prepared by: Vista Mink  Exercises Single Arm Serratus Punches in Supine with Dumbbell - 2 x daily - 7 x weekly - 1 sets - 20 reps - 3 seconds hold Supine Shoulder Internal Rotation Stretch - 1 x daily - 7 x weekly - 1 sets - 10-20 reps - 10 seconds hold Supine Shoulder External Rotation Stretch - 1 x daily - 7 x weekly - 1 sets - 10-20 reps - 10 seconds hold Sidelying Shoulder External Rotation Dumbbell - 1 x daily - 7 x weekly - 2 sets - 10 reps Standing Scapular Retraction - 5 x daily - 7 x weekly - 1 sets - 5 reps - 5 second hold Shoulder External Rotation with Anchored Resistance with Towel Under Elbow - 1 x daily - 7 x weekly - 2-3 sets - 10 reps - 3 hold Shoulder Internal Rotation with Resistance - 1 x daily - 7 x weekly - 1-2 sets - 10 reps - 3 second hold

## 2021-06-15 ENCOUNTER — Telehealth: Payer: Self-pay | Admitting: Family Medicine

## 2021-06-15 NOTE — Telephone Encounter (Signed)
Left VM requesting pt to call the office back 

## 2021-06-15 NOTE — Telephone Encounter (Signed)
Bruising and pressure where?   Does anything hurt?  What kind of deformity? Thanks

## 2021-06-15 NOTE — Telephone Encounter (Signed)
Mrs. Anna [name] called in due to she has some questions she wanted to discuss . She fell a few years ago and she has bruising all on her and she has some disformaity and if she sits for a long period of time she can feel pressure on the location and wanted to know about what to do.

## 2021-06-18 NOTE — Telephone Encounter (Signed)
Pt said it hasn't gone away at all "not even gradually" and it's been a few years so she wants to go a head and have it checked out. Appt scheduled 06/22/21

## 2021-06-18 NOTE — Telephone Encounter (Signed)
Pt said she fell down the steps a few years ago. Right after fall she had a lot of bruising and pain but that has resolved. Pt said since the fall she has had a "permanent knot" in that area. Pt said it's on her left buttock. Pt has been doing reacher and thinks it may be a hematoma but not 100% sure what it is but the knot has been there since her fall. Pt said the knot isn't painful now but given location if she sits for long periods of time it can become uncomfortable. Pt wanted to see if there would be any "long term" affects of this or if there would be any problems if she left it alone or given how long it's been there is it even possible to have it removed, and ?? If that would be a cosmetic surgery

## 2021-06-18 NOTE — Telephone Encounter (Signed)
Left 2nd VM requesting pt to call the office back  

## 2021-06-18 NOTE — Telephone Encounter (Signed)
It if is a hematoma it will go away very gradually. I can't tell her for sure w/o seeing her however. If not improving please f/u in the office

## 2021-06-20 ENCOUNTER — Other Ambulatory Visit: Payer: Self-pay

## 2021-06-20 ENCOUNTER — Ambulatory Visit: Payer: BC Managed Care – PPO | Admitting: Rehabilitative and Restorative Service Providers"

## 2021-06-20 ENCOUNTER — Encounter: Payer: Self-pay | Admitting: Rehabilitative and Restorative Service Providers"

## 2021-06-20 DIAGNOSIS — M6281 Muscle weakness (generalized): Secondary | ICD-10-CM | POA: Diagnosis not present

## 2021-06-20 DIAGNOSIS — M25511 Pain in right shoulder: Secondary | ICD-10-CM | POA: Diagnosis not present

## 2021-06-20 DIAGNOSIS — M25611 Stiffness of right shoulder, not elsewhere classified: Secondary | ICD-10-CM

## 2021-06-20 NOTE — Therapy (Addendum)
[facility] [address] [place], [place], [address] Phone: [phone]   Fax:  [phone]  Physical Therapy Treatment/Discharge  Patient Details  Name: Anna [name] MRN: [id number] Date of Birth: 01-02-1966 Referring Provider (PT): Abner Greenspan MD  PHYSICAL THERAPY DISCHARGE SUMMARY  Visits from Start of Care: 3  Current functional level related to goals / functional outcomes: See note   Remaining deficits: See note   Education / Equipment: HEP   Patient agrees to discharge. Patient goals were met. Patient is being discharged due to being pleased with the current functional level.  Encounter Date: 06/20/2021   PT End of Session - 06/20/21 1616     Visit Number 3    Number of Visits 6    Date for PT Re-Evaluation 07/04/21    PT Start Time 2694    PT Stop Time 1605    PT Time Calculation (min) 39 min    Activity Tolerance Patient tolerated treatment well;No increased pain    Behavior During Therapy [facility] for tasks assessed/performed             Past Medical History:  Diagnosis Date   Allergic rhinitis    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   Subjective Assessment - 06/20/21 1527     Subjective Santresa reports better HEP compliance.  She reports minimal pain this week.    Patient Stated Goals Return to the gym and working out    Currently in Pain? No/denies    Pain Score 0-No pain    Pain Location Shoulder    Pain Orientation Right    Pain Descriptors / Indicators Sore    Pain  Type Acute pain    Pain Radiating Towards R lateral arm above the elbow    Pain Onset More than a month ago    Pain Frequency Occasional    Aggravating Factors  Impingement postures (elbow above shoulder and hand)    Effect of Pain on Daily Activities Still some pain with impingement    Multiple Pain Sites No                [facility] PT Assessment - 06/20/21 0001       Observation/Other Assessments   Focus on Therapeutic Outcomes (FOTO)  72 (Goal 76, was 57)      ROM / Strength   AROM / PROM / Strength AROM;Strength      AROM   Overall AROM  Deficits    AROM Assessment Site Shoulder    Right/Left Shoulder Left;Right    Right Shoulder Flexion 170 Degrees    Right Shoulder Internal Rotation 70 Degrees    Right Shoulder External Rotation 90 Degrees    Right Shoulder Horizontal  ADduction 45 Degrees      Strength   Overall Strength Deficits    Strength Assessment Site Shoulder    Right/Left Shoulder Right    Right Shoulder Internal Rotation --   32.0   Right Shoulder External Rotation --   18.9 pounds  [facility] Adult PT Treatment/Exercise - 06/20/21 0001       Exercises   Exercises Shoulder      Shoulder Exercises: Supine   Protraction Strengthening;Both;20 reps    Protraction Weight (lbs) 5#    External Rotation AROM;Right;10 reps;Limitations    External Rotation Limitations 10 seconds    Internal Rotation AROM;Right;10 reps;Limitations    Internal Rotation Limitations 10 seconds      Shoulder Exercises: Sidelying   External Rotation Strengthening;Right;10 reps;Weights    External Rotation Weight (lbs) 2#    External Rotation Limitations 2 sets      Shoulder Exercises: Standing   External Rotation Strengthening;Right;10 reps;Theraband;Limitations    Theraband Level (Shoulder External Rotation) Level 3 (Green)    External Rotation Limitations 2 sets with slow eccentrics    Internal Rotation Strengthening;Right;10  reps;Theraband;Limitations    Theraband Level (Shoulder Internal Rotation) Level 3 (Green)    Internal Rotation Limitations slow eccentrics    Retraction Strengthening;Both;10 reps;Limitations    Retraction Limitations 5 seconds                    PT Education - 06/20/21 1615     Education Details Reviewed HEP and RA findings.    Person(s) Educated Patient    Methods Explanation;Demonstration;Tactile cues;Verbal cues    Comprehension Verbal cues required;Need further instruction;Returned demonstration;Verbalized understanding;Tactile cues required              PT Short Term Goals - 06/20/21 1615       PT SHORT TERM GOAL #1   Title Latiffany will report R shoulder pain consistently 0-2/10 on the Numeric Pain Rating Scale.    Time 3    Period Weeks    Status Achieved    Target Date 06/13/21               PT Long Term Goals - 06/20/21 1615       PT LONG TERM GOAL #1   Title Improve FOTO score to 76.    Baseline 72 (was 57)    Time 6    Period Weeks    Status On-going      PT LONG TERM GOAL #2   Title Improve R shoulder AROM for flexion to 170; ER to 90 and IR to 60 degrees.    Baseline Only ER has not met LTG    Time 6    Period Weeks    Status Achieved      PT LONG TERM GOAL #3   Title Improve R shoulder IR strength to at least 30 pounds and ER strength to at least 21 pounds.    Baseline 32 and 19 respectively    Time 6    Period Weeks    Status Partially Met      PT LONG TERM GOAL #4   Title Sahar will be independent and compliant with her DC HEP.    Time 6    Period Weeks    Status On-going                   Plan - 06/20/21 1617     Clinical Impression Statement Neviah is making great progress towards long-term goals.  ER:IR strength ratio is off (2:3 expected, currently < 3:5).  I anticipate once ER strength improves, she will be mostly pain-free and ready for DC from supervised PT.  We reviewed exam findings and Jeaninne was  given the option to continue PT independently.  She would like  to have one more follow-up in 2-3 weeks to make sure she is making continued progress before DC.    Examination-Activity Limitations Reach Overhead    Examination-Participation Restrictions Community Activity    Stability/Clinical Decision Making Stable/Uncomplicated    Rehab Potential Good    PT Frequency 1x / week    PT Duration 3 weeks    PT Treatment/Interventions ADLs/Self Care Home Management;Cryotherapy;Therapeutic activities;Therapeutic exercise;Neuromuscular re-education;Patient/family education;Manual techniques;Passive range of motion;Vasopneumatic Device    PT Next Visit Plan Assess progress with independent PT    PT Home Exercise Plan Access Code: [email or web address]  URL: [email or web address]  Date: 05/23/2021  Prepared by: Vista Mink    Exercises  Single Arm Serratus Punches in Supine with Dumbbell - 2 x daily - 7 x weekly - 1 sets - 20 reps - 3 seconds hold  Supine Shoulder Internal Rotation Stretch - 2 x daily - 7 x weekly - 1 sets - 10-20 reps - 10 seconds hold  Supine Shoulder External Rotation Stretch - 2 x daily - 7 x weekly - 1 sets - 10-20 reps - 10 seconds hold  Sidelying Shoulder External Rotation Dumbbell - 2 x daily - 7 x weekly - 1-2 sets - 10 reps  Standing Scapular Retraction - 5 x daily - 7 x weekly - 1 sets - 5 reps - 5 second hold    Consulted and Agree with Plan of Care Patient             Patient will benefit from skilled therapeutic intervention in order to improve the following deficits and impairments:  Decreased range of motion, Decreased strength, Increased edema, Pain, Impaired UE functional use  Visit Diagnosis: Muscle weakness (generalized)  Stiffness of right shoulder, not elsewhere classified  Acute pain of right shoulder     Problem List Patient Active Problem List   Diagnosis Date Noted   Shoulder pain 05/01/2021   Neck strain 02/28/2021   Hip pain, right 01/22/2021    Chronic pain of left thumb 01/22/2021   Routine general medical examination at a health care facility 08/04/2020   Leg pain, lateral, left 12/03/2018   Breast tenderness 06/30/2018   Urticaria 06/11/2016   Angioedema 06/11/2016   Lip swelling 05/07/2016   Allergic rhinitis 03/20/2007   GERD 03/20/2007    Farley Ly PT, MPT 06/20/2021, 4:20 PM  [facility] [address] [place], [place], [address] Phone: [phone]   Fax:  [phone]  Name: Laira Penninger MRN: [id number] Date of Birth: 1966/01/13

## 2021-06-22 ENCOUNTER — Encounter: Payer: Self-pay | Admitting: Family Medicine

## 2021-06-22 ENCOUNTER — Other Ambulatory Visit: Payer: Self-pay

## 2021-06-22 ENCOUNTER — Ambulatory Visit: Payer: BC Managed Care – PPO | Admitting: Family Medicine

## 2021-06-22 DIAGNOSIS — T148XXA Other injury of unspecified body region, initial encounter: Secondary | ICD-10-CM | POA: Diagnosis not present

## 2021-06-22 NOTE — Assessment & Plan Note (Signed)
Several years s/o blunt injury to L buttock-pt has area of fullness/ asymmetry that is not painful but bothersome (dislikes aesthetically also)  Reassuring exam  Suspect old hematoma  inst to watch for enlargement or change/pain or swelling  May desire surg /plastics eval in the future

## 2021-06-22 NOTE — Progress Notes (Signed)
Subjective:    Patient ID: Anna [name], female    DOB: [birth year], 55 y.o.   MRN: [id number]  This visit occurred during the SARS-CoV-2 public health emergency.  Safety protocols were in place, including screening questions prior to the visit, additional usage of staff PPE, and extensive cleaning of exam room while observing appropriate contact time as indicated for disinfecting solutions.   HPI Pt presents with c/o skin lump   Wt Readings from Last 3 Encounters:  06/22/21 144 lb (65.3 kg)  05/01/21 144 lb 6 oz (65.5 kg)  02/28/21 145 lb 2 oz (65.8 kg)   24.91 kg/m  Pt called on 6/24 and note that after falling down the steps several years ago she developed a knot on L buttock that did not go away entirely (was originally very bruised)  Both hands were full holding a baby at that time (they were both ok)  It is generally not painful but with prolonged sitting is uncomfortable  Not tender to the touch  Wonders if it is a hematoma   Looks like a deformity- bothers her aesthetically   Patient Active Problem List   Diagnosis Date Noted   Hematoma 06/22/2021   Shoulder pain 05/01/2021   Neck strain 02/28/2021   Hip pain, right 01/22/2021   Chronic pain of left thumb 01/22/2021   Routine general medical examination at a health care facility 08/04/2020   Leg pain, lateral, left 12/03/2018   Breast tenderness 06/30/2018   Urticaria 06/11/2016   Angioedema 06/11/2016   Lip swelling 05/07/2016   Allergic rhinitis 03/20/2007   GERD 03/20/2007   Past Medical History:  Diagnosis Date   Allergic rhinitis    Facial numbness 01/2005   neuro workup   Genital warts    GERD (gastroesophageal reflux disease)    Hiatal hernia    EGD- gastritis, esophagitis 09/2004   History of HPV infection    Knee MCL sprain 2007   Past Surgical History:  Procedure Laterality Date   LAPAROSCOPIC ASSISTED VAGINAL HYSTERECTOMY N/A 07/27/2013   Procedure: LAPAROSCOPIC ASSISTED VAGINAL  HYSTERECTOMY;  Surgeon: Anastasio Auerbach, MD for leiomyomata   Social History   Tobacco Use   Smoking status: Never   Smokeless tobacco: Never  Vaping Use   Vaping Use: Never used  Substance Use Topics   Alcohol use: No    Alcohol/week: 0.0 standard drinks   Drug use: No   Family History  Problem Relation Age of Onset   Aneurysm Father        cerebral   Breast cancer Maternal Aunt        50's   Cancer Paternal Aunt        Uterine   Colon cancer Neg Hx    Rectal cancer Neg Hx    Esophageal cancer Neg Hx    Stomach cancer Neg Hx    Liver cancer Neg Hx    No Known Allergies Current Outpatient Medications on File Prior to Visit  Medication Sig Dispense Refill   Ascorbic Acid (VITAMIN C PO) Take 1 capsule by mouth daily.     B Complex-C-Folic Acid (SM B SUPER VITAMIN COMPLEX PO) Take 1 capsule by mouth daily.     CALCIUM-MAGNESIUM-ZINC PO Take 2 capsules by mouth daily.     cetirizine (ZYRTEC) 10 MG tablet Take 10 mg by mouth as needed.      Cholecalciferol (VITAMIN D3 PO) Take 2,000 Units by mouth daily.      diphenhydrAMINE (BENADRYL) 50  MG capsule Take 25 mg by mouth as needed.      vitamin E 200 UNIT capsule Take 200 Units by mouth daily.     No current facility-administered medications on file prior to visit.     Review of Systems  Constitutional:  Negative for activity change, appetite change, fatigue, fever and unexpected weight change.  HENT:  Negative for congestion, ear pain, rhinorrhea, sinus pressure and sore throat.   Eyes:  Negative for pain, redness and visual disturbance.  Respiratory:  Negative for cough, shortness of breath and wheezing.   Cardiovascular:  Negative for chest pain and palpitations.  Gastrointestinal:  Negative for abdominal pain, blood in stool, constipation and diarrhea.  Endocrine: Negative for polydipsia and polyuria.  Genitourinary:  Negative for dysuria, frequency and urgency.  Musculoskeletal:  Negative for arthralgias, back pain  and myalgias.       Lump on L buttock  Skin:  Negative for pallor and rash.  Allergic/Immunologic: Negative for environmental allergies.  Neurological:  Negative for dizziness, syncope and headaches.  Hematological:  Negative for adenopathy. Does not bruise/bleed easily.  Psychiatric/Behavioral:  Negative for decreased concentration and dysphoric mood. The patient is not nervous/anxious.       Objective:   Physical Exam Constitutional:      General: She is not in acute distress.    Appearance: Normal appearance. She is normal weight.  HENT:     Head: Normocephalic and atraumatic.  Eyes:     General: No scleral icterus.    Conjunctiva/sclera: Conjunctivae normal.     Pupils: Pupils are equal, round, and reactive to light.  Cardiovascular:     Rate and Rhythm: Normal rate and regular rhythm.     Pulses: Normal pulses.  Pulmonary:     Effort: Pulmonary effort is normal. No respiratory distress.  Musculoskeletal:     Cervical back: Normal range of motion.     Right lower leg: No edema.     Left lower leg: No edema.     Comments: L buttock - area of fullness medial w/o fluctuance (also not firm)  Some dimpling of fat- but similar to other side  No tenderness No obvious deformity  Nl gait  No weakness  No skin color change or ecchymosis or rash  Lymphadenopathy:     Cervical: No cervical adenopathy.  Skin:    Coloration: Skin is not jaundiced or pale.     Findings: No bruising, erythema, lesion or rash.  Neurological:     Mental Status: She is alert.     Sensory: No sensory deficit.     Motor: No weakness.  Psychiatric:        Mood and Affect: Mood normal.          Assessment & Plan:   Problem List Items Addressed This Visit       Other   Hematoma    Several years s/o blunt injury to L buttock-pt has area of fullness/ asymmetry that is not painful but bothersome (dislikes aesthetically also)  Reassuring exam  Suspect old hematoma  inst to watch for enlargement  or change/pain or swelling  May desire surg /plastics eval in the future

## 2021-06-22 NOTE — Patient Instructions (Addendum)
If you notice the area enlarging or hurting or becoming more sensitive please let us know If you want to see a surgeon/ plastic surgeon let us know  Keep me posted

## 2021-06-27 ENCOUNTER — Encounter: Payer: BC Managed Care – PPO | Admitting: Rehabilitative and Restorative Service Providers"

## 2021-07-12 ENCOUNTER — Encounter: Payer: BC Managed Care – PPO | Admitting: Rehabilitative and Restorative Service Providers"

## 2021-07-17 DIAGNOSIS — Z20822 Contact with and (suspected) exposure to covid-19: Secondary | ICD-10-CM | POA: Diagnosis not present

## 2021-07-24 DIAGNOSIS — W57XXXA Bitten or stung by nonvenomous insect and other nonvenomous arthropods, initial encounter: Secondary | ICD-10-CM | POA: Diagnosis not present

## 2021-07-24 DIAGNOSIS — L509 Urticaria, unspecified: Secondary | ICD-10-CM | POA: Diagnosis not present

## 2021-08-01 ENCOUNTER — Other Ambulatory Visit: Payer: Self-pay | Admitting: Family Medicine

## 2021-08-01 DIAGNOSIS — Z1231 Encounter for screening mammogram for malignant neoplasm of breast: Secondary | ICD-10-CM

## 2021-08-17 ENCOUNTER — Ambulatory Visit
Admission: RE | Admit: 2021-08-17 | Discharge: 2021-08-17 | Disposition: A | Discharge status: Home / Self Care | Payer: BC Managed Care – PPO | Source: Ambulatory Visit | Attending: Family Medicine | Admitting: Family Medicine

## 2021-08-17 ENCOUNTER — Other Ambulatory Visit: Payer: Self-pay

## 2021-08-17 DIAGNOSIS — Z1231 Encounter for screening mammogram for malignant neoplasm of breast: Secondary | ICD-10-CM

## 2021-09-02 IMAGING — MG DIGITAL SCREENING BILAT W/ TOMO W/ CAD
6 of 10 series · 6 of 30 positions shown · non-contrast
Comparison: Previous exam(s).

CLINICAL DATA: Screening.

EXAM:
DIGITAL SCREENING BILATERAL MAMMOGRAM WITH TOMO AND CAD

[L CC synth-2D]
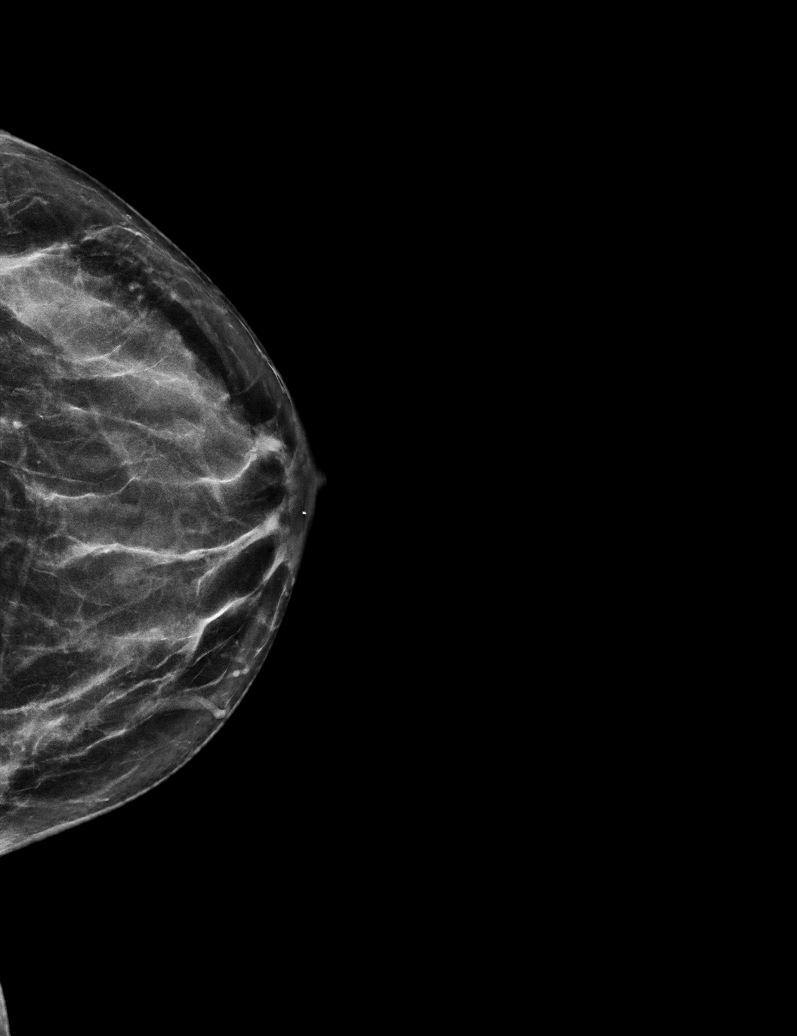

[R CC synth-2D]
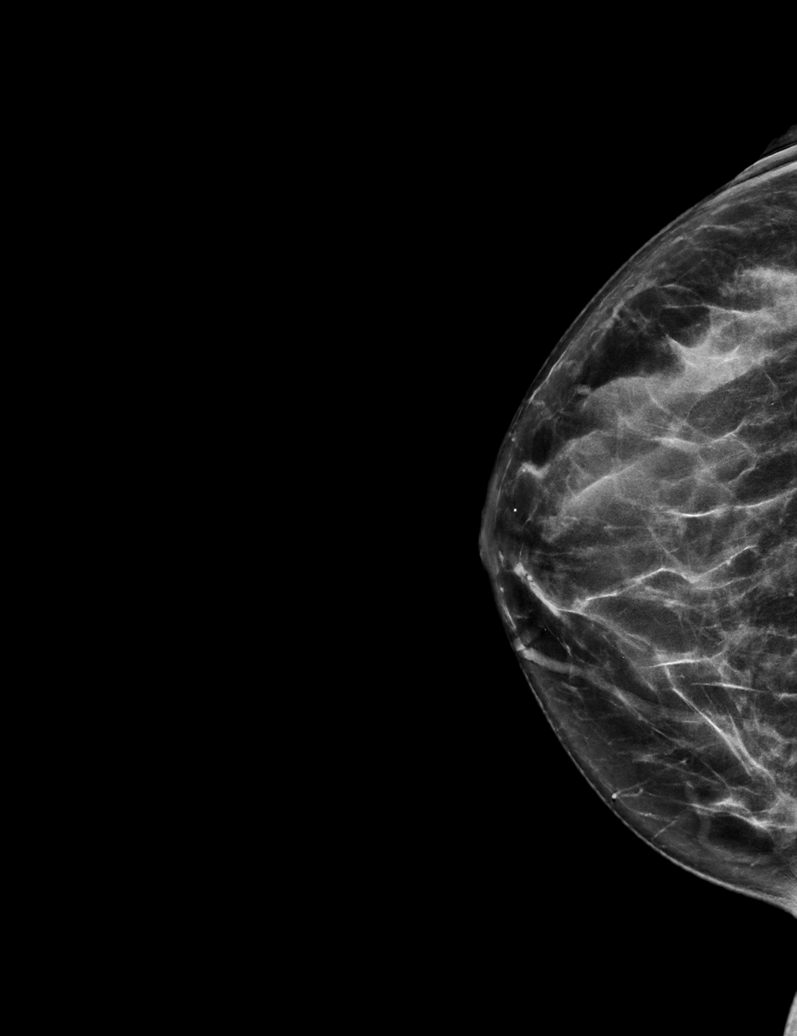

[R MLO synth-2D (1 of 2)]
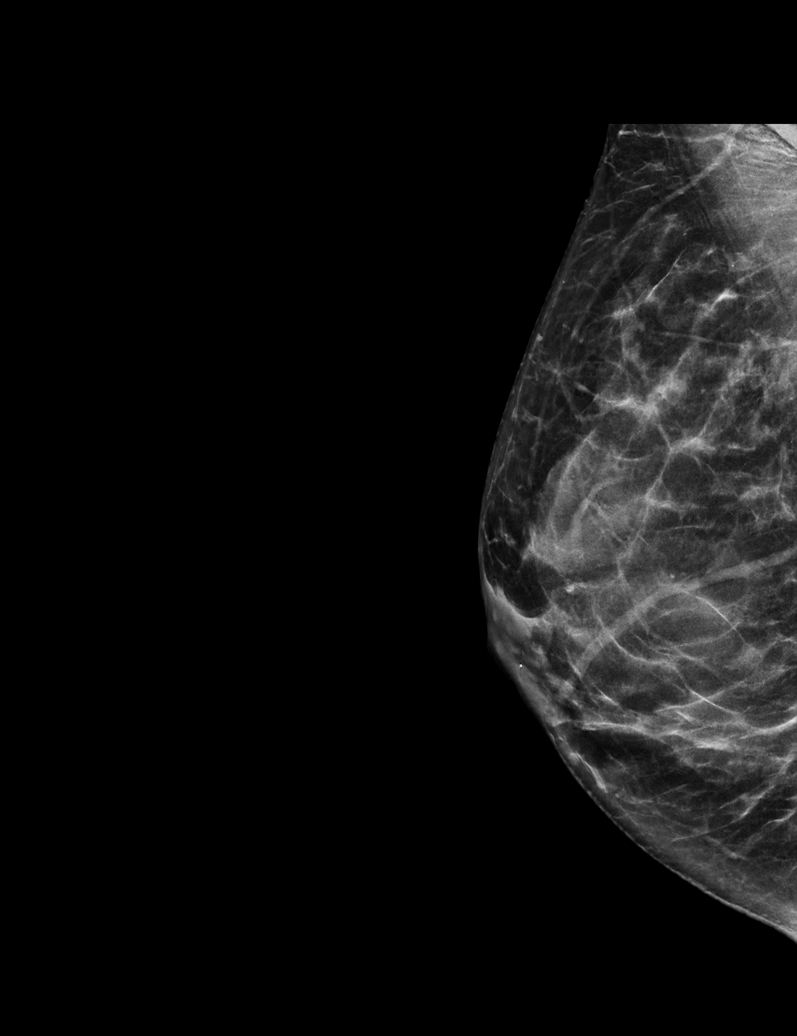

[L MLO synth-2D]
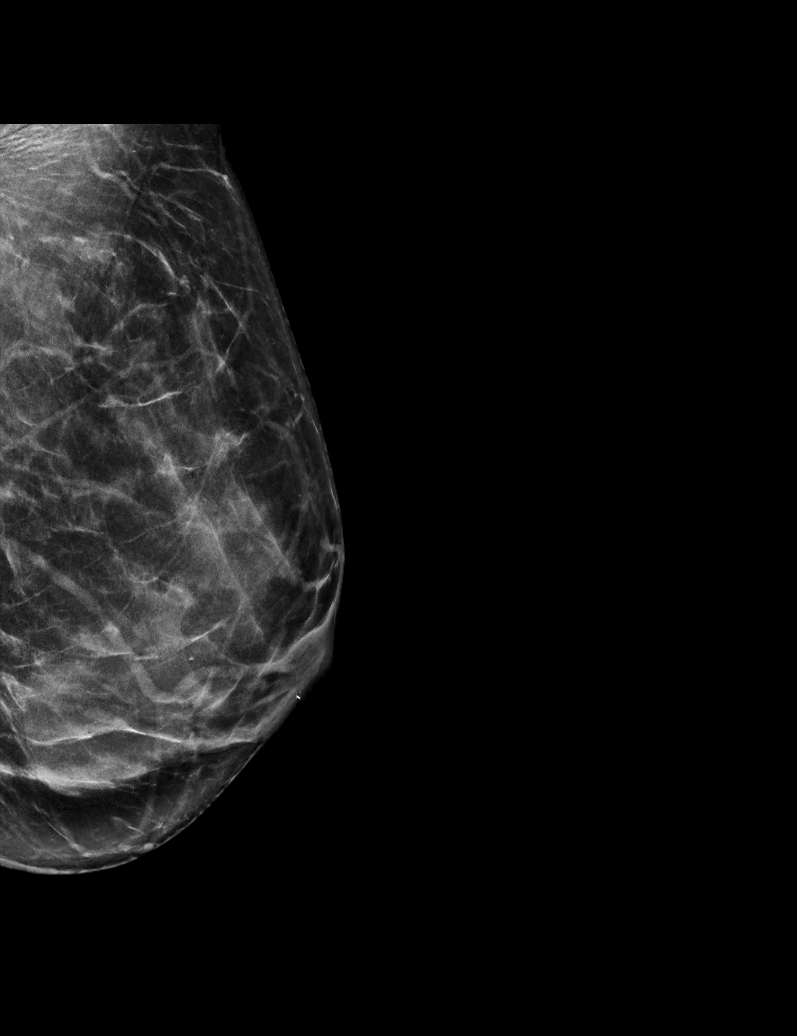

[R MLO synth-2D (2 of 2)]
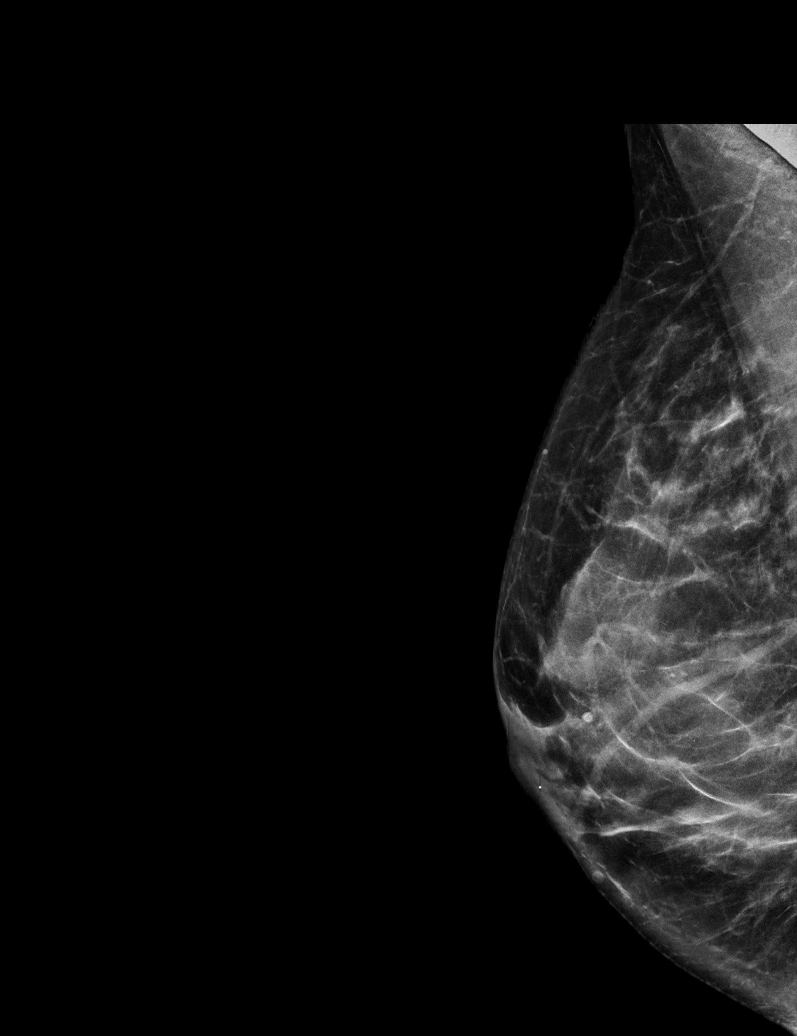

[R CC tomo · tomo slice 33/66.0]
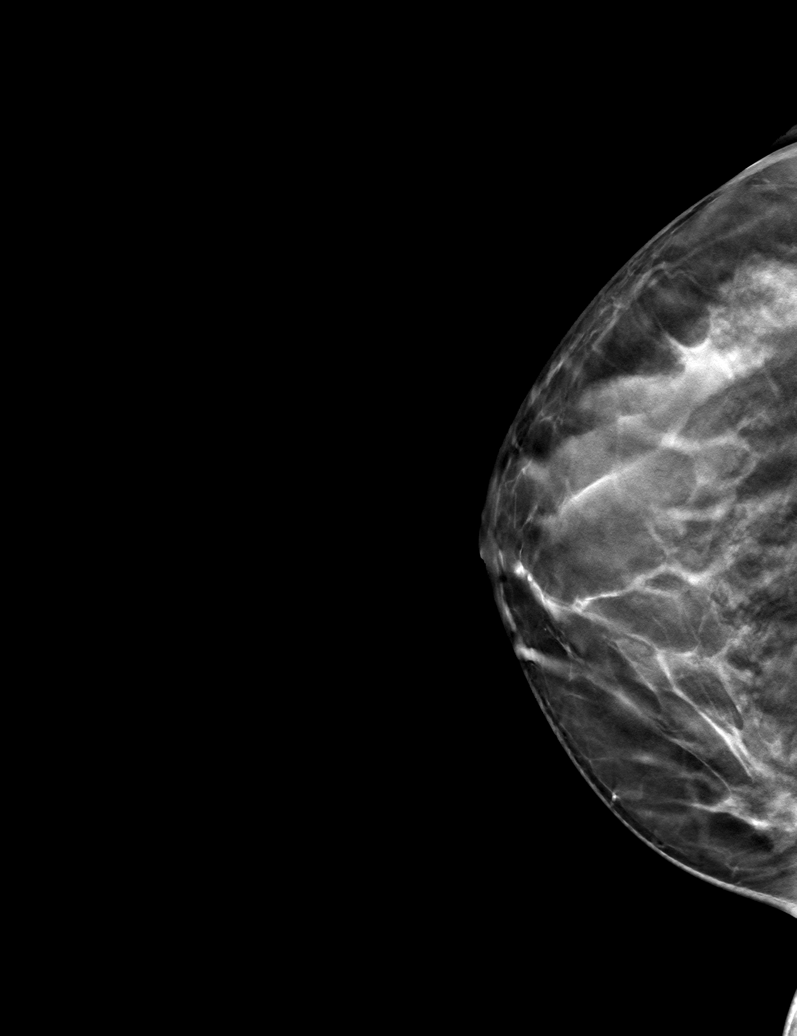

[6 of 30 positions shown; findings below may reference images not displayed]

ACR Breast Density Category d: The breast tissue is extremely dense,
which lowers the sensitivity of mammography
FINDINGS: There are no findings suspicious for malignancy. Images were
processed with CAD.
IMPRESSION: No mammographic evidence of malignancy. A result letter of this
screening mammogram will be mailed directly to the patient.

RECOMMENDATION:
Screening mammogram in one year. (Code:[id number]-[id number])

BI-RADS CATEGORY  1: Negative.

## 2021-09-11 ENCOUNTER — Telehealth: Payer: Self-pay | Admitting: Family Medicine

## 2021-09-11 DIAGNOSIS — Z0000 Encounter for general adult medical examination without abnormal findings: Secondary | ICD-10-CM

## 2021-09-11 NOTE — Telephone Encounter (Signed)
-----   Message from Ellamae Sia sent at 08/28/2021 11:32 AM EDT ----- Regarding: Lab orders for Wednesday, 9.21.22 Patient is scheduled for CPX labs, please order future labs, Thanks , Karna Christmas

## 2021-09-12 ENCOUNTER — Other Ambulatory Visit: Payer: Self-pay

## 2021-09-12 ENCOUNTER — Other Ambulatory Visit (INDEPENDENT_AMBULATORY_CARE_PROVIDER_SITE_OTHER): Payer: BC Managed Care – PPO

## 2021-09-12 DIAGNOSIS — Z0000 Encounter for general adult medical examination without abnormal findings: Secondary | ICD-10-CM | POA: Diagnosis not present

## 2021-09-12 LAB — COMPREHENSIVE METABOLIC PANEL
ALT: 11 U/L (ref 0–35)
AST: 15 U/L (ref 0–37)
Albumin: 3.7 g/dL (ref 3.5–5.2)
Alkaline Phosphatase: 48 U/L (ref 39–117)
BUN: 14 mg/dL (ref 6–23)
CO2: 26 mEq/L (ref 19–32)
Calcium: 8.6 mg/dL (ref 8.4–10.5)
Chloride: 106 mEq/L (ref 96–112)
Creatinine, Ser: 0.7 mg/dL (ref 0.40–1.20)
GFR: 98.32 mL/min (ref >60.00)
Glucose, Bld: 82 mg/dL (ref 70–99)
Potassium: 3.7 mEq/L (ref 3.5–5.1)
Sodium: 138 mEq/L (ref 135–145)
Total Bilirubin: 0.8 mg/dL (ref 0.2–1.2)
Total Protein: 6.5 g/dL (ref 6.0–8.3)

## 2021-09-12 LAB — LIPID PANEL
Cholesterol: 187 mg/dL (ref 0–200)
HDL: 54.8 mg/dL (ref >39.00)
LDL Cholesterol: 117 mg/dL — ABNORMAL HIGH (ref 0–99)
NonHDL: 132.19
Total CHOL/HDL Ratio: 3
Triglycerides: 75 mg/dL (ref 0.0–149.0)
VLDL: 15 mg/dL (ref 0.0–40.0)

## 2021-09-12 LAB — CBC WITH DIFFERENTIAL/PLATELET
Basophils Absolute: 0.1 10*3/uL (ref 0.0–0.1)
Basophils Relative: 1.1 % (ref 0.0–3.0)
Eosinophils Absolute: 0.3 10*3/uL (ref 0.0–0.7)
Eosinophils Relative: 5 % (ref 0.0–5.0)
HCT: 38.5 % (ref 36.0–46.0)
Hemoglobin: 12.6 g/dL (ref 12.0–15.0)
Lymphocytes Relative: 29.5 % (ref 12.0–46.0)
Lymphs Abs: 1.8 10*3/uL (ref 0.7–4.0)
MCHC: 32.6 g/dL (ref 30.0–36.0)
MCV: 87.7 fl (ref 78.0–100.0)
Monocytes Absolute: 0.6 10*3/uL (ref 0.1–1.0)
Monocytes Relative: 9.4 % (ref 3.0–12.0)
Neutro Abs: 3.4 10*3/uL (ref 1.4–7.7)
Neutrophils Relative %: 55 % (ref 43.0–77.0)
Platelets: 306 10*3/uL (ref 150.0–400.0)
RBC: 4.39 Mil/uL (ref 3.87–5.11)
RDW: 13.5 % (ref 11.5–15.5)
WBC: 6.2 10*3/uL (ref 4.0–10.5)

## 2021-09-12 LAB — TSH: TSH: 1.98 u[IU]/mL (ref 0.35–5.50)

## 2021-09-18 ENCOUNTER — Ambulatory Visit (INDEPENDENT_AMBULATORY_CARE_PROVIDER_SITE_OTHER): Payer: BC Managed Care – PPO | Admitting: Family Medicine

## 2021-09-18 ENCOUNTER — Other Ambulatory Visit: Payer: Self-pay

## 2021-09-18 ENCOUNTER — Encounter: Payer: Self-pay | Admitting: Family Medicine

## 2021-09-18 VITALS — BP 136/78 | HR 58 | Temp 97.4°F | Ht 64.0 in | Wt 144.0 lb

## 2021-09-18 DIAGNOSIS — N644 Mastodynia: Secondary | ICD-10-CM

## 2021-09-18 DIAGNOSIS — Z0000 Encounter for general adult medical examination without abnormal findings: Secondary | ICD-10-CM | POA: Diagnosis not present

## 2021-09-18 NOTE — Patient Instructions (Addendum)
If you are interested in the shingles vaccine series (Shingrix), call your insurance or pharmacy to check on coverage and location it must be given.  If affordable - you can schedule it here or at your pharmacy depending on coverage   For cholesterol  Avoid red meat/ fried foods/ egg yolks/ fatty breakfast meats/ butter, cheese and high fat dairy/ and shellfish   I suspect your cholesterol will look better back to your regular diet habits  Keep exercising   Continue vitamin D and calcium for bone health

## 2021-09-18 NOTE — Assessment & Plan Note (Signed)
Reviewed health habits including diet and exercise and skin cancer prevention Reviewed appropriate screening tests for age  Also reviewed health mt list, fam hx and immunization status , as well as social and family history   See HPI Labs reviewed  Good self care  Declines shingrix at this time Declines flu shot  covid immunized  Mammogram is utd Colonoscopy utd - with 10 y recall  upcomine gyn appt noted, pap utd in setting of a hysterectomy Enc good ca and vit D intake for bone health

## 2021-09-18 NOTE — Assessment & Plan Note (Addendum)
This is intermittent Not today  Enc self breast exams  Planning f/u with gyn

## 2021-09-18 NOTE — Progress Notes (Signed)
Subjective:    Patient ID: Anna [name], female    DOB: Mar 11, 1966, 55 y.o.   MRN: [id number]  This visit occurred during the SARS-CoV-2 public health emergency.  Safety protocols were in place, including screening questions prior to the visit, additional usage of staff PPE, and extensive cleaning of exam room while observing appropriate contact time as indicated for disinfecting solutions.   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)  Going well so far  Still does some zoom work also  Very busy   Taking care of herself  Went to [place] - did not eat as well  Had a wonderful trip (there for a month)    Zoster status -not interested in vaccine   Covid immunized  Flu shot- declines today  Tdap 4/14   Mammogram 8/22 Self breast exam- no lumps  M aunt had breast cancer    Colonoscopy 11/18  with recall of 10 year   Pap 4/19 neg at gyn   Has appt upcoming in October (Dr Phineas Real left)  Has had a hysterectomy   BP Readings from Last 3 Encounters:  09/18/21 136/78  06/22/21 98/62  05/01/21 116/74   Pulse Readings from Last 3 Encounters:  09/18/21 (!) 58  06/22/21 81  05/01/21 65   Exercise -regular /gym  Walked on vacation and hiked   Cholesterol  Lab Results  Component Value Date   CHOL 187 09/12/2021   CHOL 181 08/07/2020   CHOL 195 07/06/2019   Lab Results  Component Value Date   HDL 54.80 09/12/2021   HDL 57.70 08/07/2020   HDL 72 07/06/2019   Lab Results  Component Value Date   LDLCALC 117 (H) 09/12/2021   LDLCALC 105 (H) 08/07/2020   LDLCALC 105 (H) 07/06/2019   Lab Results  Component Value Date   TRIG 75.0 09/12/2021   TRIG 91.0 08/07/2020   TRIG 89  07/06/2019   Lab Results  Component Value Date   CHOLHDL 3 09/12/2021   CHOLHDL 3 08/07/2020   CHOLHDL 2.7 07/06/2019   Lab Results  Component Value Date   LDLDIRECT 123.8 10/13/2008  LDLis up slightly  Good HDL in the 50s  Will be back to eating normally after 1 mo of vacation  Avoiding fried foods Has beef on vacation-getting away from that  Had shellfish on vacation    Other labs:  Lab Results  Component Value Date   WBC 6.2 09/12/2021   HGB 12.6 09/12/2021   HCT 38.5 09/12/2021   MCV 87.7 09/12/2021   PLT 306.0 09/12/2021   Lab Results  Component Value Date   CREATININE 0.70 09/12/2021   BUN 14 09/12/2021   NA 138 09/12/2021   K 3.7 09/12/2021   CL 106 09/12/2021   CO2 26 09/12/2021   Lab Results  Component Value Date   TSH 1.98 09/12/2021   Lab Results  Component Value Date   ALT 11 09/12/2021   AST 15 09/12/2021   ALKPHOS 48 09/12/2021   BILITOT 0.8 09/12/2021   Patient Active Problem List   Diagnosis Date Noted   Hematoma 06/22/2021   Shoulder pain 05/01/2021   Neck strain  02/28/2021   Hip pain, right 01/22/2021   Chronic pain of left thumb 01/22/2021   Routine general medical examination at a health care facility 08/04/2020   Leg pain, lateral, left 12/03/2018   Breast tenderness 06/30/2018   Urticaria 06/11/2016   Angioedema 06/11/2016   Lip swelling 05/07/2016   Allergic rhinitis 03/20/2007   GERD 03/20/2007   Past Medical History:  Diagnosis Date   Allergic rhinitis    Facial numbness 01/2005   neuro workup   Genital warts    GERD (gastroesophageal reflux disease)    Hiatal hernia    EGD- gastritis, esophagitis 09/2004   History of HPV infection    Knee MCL sprain 2007   Past Surgical History:  Procedure Laterality Date   LAPAROSCOPIC ASSISTED VAGINAL HYSTERECTOMY N/A 07/27/2013   Procedure: LAPAROSCOPIC ASSISTED VAGINAL HYSTERECTOMY;  Surgeon: Anastasio Auerbach, MD for leiomyomata   Social History   Tobacco Use   Smoking  status: Never   Smokeless tobacco: Never  Vaping Use   Vaping Use: Never used  Substance Use Topics   Alcohol use: No    Alcohol/week: 0.0 standard drinks   Drug use: No   Family History  Problem Relation Age of Onset   Aneurysm Father        cerebral   Breast cancer Maternal Aunt        50's   Cancer Paternal Aunt        Uterine   Colon cancer Neg Hx    Rectal cancer Neg Hx    Esophageal cancer Neg Hx    Stomach cancer Neg Hx    Liver cancer Neg Hx    No Known Allergies Current Outpatient Medications on File Prior to Visit  Medication Sig Dispense Refill   Ascorbic Acid (VITAMIN C PO) Take 1 capsule by mouth daily.     B Complex-C-Folic Acid (SM B SUPER VITAMIN COMPLEX PO) Take 1 capsule by mouth daily.     CALCIUM-MAGNESIUM-ZINC PO Take 2 capsules by mouth daily.     cetirizine (ZYRTEC) 10 MG tablet Take 10 mg by mouth as needed.      Cholecalciferol (VITAMIN D3 PO) Take 2,000 Units by mouth daily.      diphenhydrAMINE (BENADRYL) 50 MG capsule Take 25 mg by mouth as needed.      vitamin E 200 UNIT capsule Take 200 Units by mouth daily.     No current facility-administered medications on file prior to visit.     Review of Systems  Constitutional:  Negative for activity change, appetite change, fatigue, fever and unexpected weight change.  HENT:  Negative for congestion, ear pain, rhinorrhea, sinus pressure and sore throat.   Eyes:  Negative for pain, redness and visual disturbance.  Respiratory:  Negative for cough, shortness of breath and wheezing.   Cardiovascular:  Negative for chest pain and palpitations.  Gastrointestinal:  Negative for abdominal pain, blood in stool, constipation and diarrhea.  Endocrine: Negative for polydipsia and polyuria.  Genitourinary:  Negative for dysuria, frequency and urgency.  Musculoskeletal:  Negative for arthralgias, back pain and myalgias.  Skin:  Negative for pallor and rash.  Allergic/Immunologic: Negative for environmental  allergies.  Neurological:  Negative for dizziness, syncope and headaches.  Hematological:  Negative for adenopathy. Does not bruise/bleed easily.  Psychiatric/Behavioral:  Negative for decreased concentration and dysphoric mood. The patient is not nervous/anxious.       Objective:   Physical Exam Constitutional:      General: She is not  in acute distress.    Appearance: Normal appearance. She is well-developed and normal weight. She is not ill-appearing or diaphoretic.  HENT:     Head: Normocephalic and atraumatic.     Right Ear: Tympanic membrane, ear canal and external ear normal.     Left Ear: Tympanic membrane, ear canal and external ear normal.     Nose: Nose normal. No congestion.     Mouth/Throat:     Mouth: Mucous membranes are moist.     Pharynx: Oropharynx is clear. No posterior oropharyngeal erythema.  Eyes:     General: No scleral icterus.    Extraocular Movements: Extraocular movements intact.     Conjunctiva/sclera: Conjunctivae normal.     Pupils: Pupils are equal, round, and reactive to light.  Neck:     Thyroid: No thyromegaly.     Vascular: No carotid bruit or JVD.  Cardiovascular:     Rate and Rhythm: Normal rate and regular rhythm.     Pulses: Normal pulses.     Heart sounds: Normal heart sounds.    No gallop.  Pulmonary:     Effort: Pulmonary effort is normal. No respiratory distress.     Breath sounds: Normal breath sounds. No wheezing.     Comments: Good air exch Chest:     Chest wall: No tenderness.  Abdominal:     General: Bowel sounds are normal. There is no distension or abdominal bruit.     Palpations: Abdomen is soft. There is no mass.     Tenderness: There is no abdominal tenderness.     Hernia: No hernia is present.  Genitourinary:    Comments: Breast exam: No mass, nodules, thickening, tenderness, bulging, retraction, inflamation, nipple discharge or skin changes noted.  No axillary or clavicular LA.     Musculoskeletal:        General: No  tenderness. Normal range of motion.     Cervical back: Normal range of motion and neck supple. No rigidity. No muscular tenderness.     Right lower leg: No edema.     Left lower leg: No edema.     Comments: No kyphosis   Lymphadenopathy:     Cervical: No cervical adenopathy.  Skin:    General: Skin is warm and dry.     Coloration: Skin is not pale.     Findings: No erythema or rash.     Comments: Few skin tags and lentigines  Neurological:     Mental Status: She is alert. Mental status is at baseline.     Cranial Nerves: No cranial nerve deficit.     Motor: No abnormal muscle tone.     Coordination: Coordination normal.     Gait: Gait normal.     Deep Tendon Reflexes: Reflexes are normal and symmetric. Reflexes normal.  Psychiatric:        Mood and Affect: Mood normal.        Cognition and Memory: Cognition and memory normal.     Comments: Pleasant           Assessment & Plan:   Problem List Items Addressed This Visit       Other   Breast tenderness    This is intermittent Not today  Enc self breast exams  Planning f/u with gyn      Routine general medical examination at a health care facility - Primary    Reviewed health habits including diet and exercise and skin cancer prevention Reviewed appropriate screening tests for  age  Also reviewed health mt list, fam hx and immunization status , as well as social and family history   See HPI Labs reviewed  Good self care  Declines shingrix at this time Declines flu shot  covid immunized  Mammogram is utd Colonoscopy utd - with 10 y recall  upcomine gyn appt noted, pap utd in setting of a hysterectomy Enc good ca and vit D intake for bone health

## 2021-10-29 ENCOUNTER — Ambulatory Visit: Payer: BC Managed Care – PPO | Admitting: Family Medicine

## 2021-10-29 ENCOUNTER — Other Ambulatory Visit: Payer: Self-pay

## 2021-10-29 ENCOUNTER — Encounter: Payer: Self-pay | Admitting: *Deleted

## 2021-10-29 ENCOUNTER — Encounter: Payer: Self-pay | Admitting: Family Medicine

## 2021-10-29 VITALS — BP 118/64 | HR 78 | Temp 98.6°F | Ht 64.0 in | Wt 145.0 lb

## 2021-10-29 DIAGNOSIS — R110 Nausea: Secondary | ICD-10-CM | POA: Diagnosis not present

## 2021-10-29 DIAGNOSIS — R109 Unspecified abdominal pain: Secondary | ICD-10-CM | POA: Diagnosis not present

## 2021-10-29 LAB — POC URINALSYSI DIPSTICK (AUTOMATED)
Bilirubin, UA: NEGATIVE
Blood, UA: NEGATIVE
Glucose, UA: NEGATIVE
Ketones, UA: NEGATIVE
Leukocytes, UA: NEGATIVE
Nitrite, UA: NEGATIVE
Protein, UA: NEGATIVE
Spec Grav, UA: 1.025 (ref 1.010–1.025)
Urobilinogen, UA: 0.2 E.U./dL
pH, UA: 5.5 (ref 5.0–8.0)

## 2021-10-29 NOTE — Progress Notes (Signed)
Subjective:    Patient ID: Anna [name], female    DOB: Mar 10, 1966, 55 y.o.   MRN: [id number]  This visit occurred during the SARS-CoV-2 public health emergency.  Safety protocols were in place, including screening questions prior to the visit, additional usage of staff PPE, and extensive cleaning of exam room while observing appropriate contact time as indicated for disinfecting solutions.   HPI Pt presents with R side pain   Wt Readings from Last 3 Encounters:  10/29/21 145 lb (65.8 kg)  09/18/21 144 lb (65.3 kg)  06/22/21 144 lb (65.3 kg)   24.89 kg/m  Occurring for 3 weeks off and on  Side of low back  Radiates to lateral abdomen in the R  Pain is sharp if she lifts heavy   For years-the R side of her back bothers her when standing for long periods of time in the kitchen   Not triggered by movement  No one position is more or less comfortable  Pain is at times dull / other times sharp Pulling sensation when sitting  No rash   Lab Results  Component Value Date   ALT 11 09/12/2021   AST 15 09/12/2021   ALKPHOS 48 09/12/2021   BILITOT 0.8 09/12/2021    No history of renal stone  No frequency or dysuria     Also some nausea  Off and on/ some days   Felt tired all week- did not work out last week    Some R sided neck pain  Is chronic  May see a chiropractor   Ua today Results for orders placed or performed in visit on 10/29/21  POCT Urinalysis Dipstick (Automated)  Result Value Ref Range   Color, UA Yellow    Clarity, UA clear    Glucose, UA Negative Negative   Bilirubin, UA neg    Ketones, UA neg    Spec Grav, UA 1.025 1.010 - 1.025   Blood, UA neg    pH, UA 5.5 5.0 - 8.0   Protein, UA Negative Negative   Urobilinogen, UA 0.2 0.2 or 1.0 E.U./dL   Nitrite, UA neg    Leukocytes, UA Negative Negative   Last labs Lab Results  Component Value Date   CREATININE 0.70 09/12/2021   BUN 14 09/12/2021   NA 138 09/12/2021   K 3.7  09/12/2021   CL 106 09/12/2021   CO2 26 09/12/2021   Lab Results  Component Value Date   ALT 11 09/12/2021   AST 15 09/12/2021   ALKPHOS 48 09/12/2021   BILITOT 0.8 09/12/2021   Lab Results  Component Value Date   WBC 6.2 09/12/2021   HGB 12.6 09/12/2021   HCT 38.5 09/12/2021   MCV 87.7 09/12/2021   PLT 306.0 09/12/2021    Patient Active Problem List   Diagnosis Date Noted   Nausea 10/29/2021   Flank pain 10/29/2021   Hematoma 06/22/2021   Shoulder pain 05/01/2021   Neck strain 02/28/2021   Hip pain, right 01/22/2021   Chronic pain of left thumb 01/22/2021   Routine general medical examination at a health care facility 08/04/2020   Right lateral abdominal pain 01/04/2020   Leg pain, lateral, left 12/03/2018   Breast tenderness 06/30/2018   Urticaria 06/11/2016   Angioedema 06/11/2016   Lip swelling 05/07/2016   Allergic rhinitis 03/20/2007   GERD 03/20/2007   Past Medical History:  Diagnosis Date   Allergic rhinitis    Facial numbness 01/2005   neuro workup  Genital warts    GERD (gastroesophageal reflux disease)    Hiatal hernia    EGD- gastritis, esophagitis 09/2004   History of HPV infection    Knee MCL sprain 2007   Past Surgical History:  Procedure Laterality Date   LAPAROSCOPIC ASSISTED VAGINAL HYSTERECTOMY N/A 07/27/2013   Procedure: LAPAROSCOPIC ASSISTED VAGINAL HYSTERECTOMY;  Surgeon: Anastasio Auerbach, MD for leiomyomata   Social History   Tobacco Use   Smoking status: Never   Smokeless tobacco: Never  Vaping Use   Vaping Use: Never used  Substance Use Topics   Alcohol use: No    Alcohol/week: 0.0 standard drinks   Drug use: No   Family History  Problem Relation Age of Onset   Aneurysm Father        cerebral   Breast cancer Maternal Aunt        50's   Cancer Paternal Aunt        Uterine   Colon cancer Neg Hx    Rectal cancer Neg Hx    Esophageal cancer Neg Hx    Stomach cancer Neg Hx    Liver cancer Neg Hx    No Known  Allergies Current Outpatient Medications on File Prior to Visit  Medication Sig Dispense Refill   CALCIUM-MAGNESIUM-ZINC PO Take 2 capsules by mouth daily.     cetirizine (ZYRTEC) 10 MG tablet Take 10 mg by mouth as needed.      Cholecalciferol (VITAMIN D3 PO) Take 2,000 Units by mouth daily.      diphenhydrAMINE (BENADRYL) 50 MG capsule Take 25 mg by mouth as needed.      Probiotic Product (PROBIOTIC DAILY PO) Take by mouth.     Ascorbic Acid (VITAMIN C PO) Take 1 capsule by mouth daily. (Patient not taking: Reported on 10/29/2021)     B Complex-C-Folic Acid (SM B SUPER VITAMIN COMPLEX PO) Take 1 capsule by mouth daily. (Patient not taking: Reported on 10/29/2021)     vitamin E 200 UNIT capsule Take 200 Units by mouth daily. (Patient not taking: Reported on 10/29/2021)     No current facility-administered medications on file prior to visit.     Review of Systems  Constitutional:  Negative for activity change, appetite change, fatigue, fever and unexpected weight change.  HENT:  Negative for congestion, ear pain, rhinorrhea, sinus pressure and sore throat.   Eyes:  Negative for pain, redness and visual disturbance.  Respiratory:  Negative for cough, shortness of breath and wheezing.   Cardiovascular:  Negative for chest pain and palpitations.  Gastrointestinal:  Positive for abdominal pain and nausea. Negative for abdominal distention, anal bleeding, blood in stool, constipation, diarrhea, rectal pain and vomiting.  Endocrine: Negative for polydipsia and polyuria.  Genitourinary:  Negative for dysuria, frequency and urgency.  Musculoskeletal:  Positive for back pain, myalgias and neck pain. Negative for arthralgias.       R side of body gives her problems with muscle tightness  Skin:  Negative for pallor and rash.  Allergic/Immunologic: Negative for environmental allergies.  Neurological:  Negative for dizziness, syncope and headaches.  Hematological:  Negative for adenopathy. Does not  bruise/bleed easily.  Psychiatric/Behavioral:  Negative for decreased concentration and dysphoric mood. The patient is not nervous/anxious.       Objective:   Physical Exam Constitutional:      General: She is not in acute distress.    Appearance: Normal appearance. She is well-developed and normal weight. She is not ill-appearing or diaphoretic.  HENT:  Head: Normocephalic and atraumatic.  Eyes:     Conjunctiva/sclera: Conjunctivae normal.     Pupils: Pupils are equal, round, and reactive to light.  Neck:     Thyroid: No thyromegaly.     Vascular: No carotid bruit or JVD.  Cardiovascular:     Rate and Rhythm: Normal rate and regular rhythm.     Heart sounds: Normal heart sounds.    No gallop.  Pulmonary:     Effort: Pulmonary effort is normal. No respiratory distress.     Breath sounds: Normal breath sounds. No wheezing or rales.  Abdominal:     General: Abdomen is flat. Bowel sounds are normal. There is no distension or abdominal bruit.     Palpations: Abdomen is soft. There is no hepatomegaly, splenomegaly, mass or pulsatile mass.     Tenderness: There is no abdominal tenderness. There is right CVA tenderness. There is no left CVA tenderness, guarding or rebound. Negative signs include Murphy's sign and McBurney's sign.     Hernia: No hernia is present.     Comments: Some tenderness in R flank and lateral R abdomen No M  No rebound or guarding    Musculoskeletal:     Cervical back: Normal range of motion and neck supple.     Right lower leg: No edema.     Left lower leg: No edema.     Comments: Nl rom LS and TS  Movement and position change to not trigger her side pain  Lymphadenopathy:     Cervical: No cervical adenopathy.  Skin:    General: Skin is warm and dry.     Coloration: Skin is not pale.     Findings: No rash.  Neurological:     Mental Status: She is alert.     Coordination: Coordination normal.     Deep Tendon Reflexes: Reflexes are normal and  symmetric. Reflexes normal.  Psychiatric:        Mood and Affect: Mood normal.          Assessment & Plan:   Problem List Items Addressed This Visit       Other   Right lateral abdominal pain - Primary    Unsure if this is similar to the past  Also some nausea Worse after eating  Not positional  ua is entirely clear  No h/o liver problems  Reviewed labs from late sept-reassuring  abd [place] ordered to look at liver/gb and kidneys  Pending report ER precautions reviewed  Will update if worse  May try pepcid otc to see if it helps nausea       Relevant Orders   US Abdomen Complete   Nausea    With R flank and upper abd pain  Reassuring exam  Plan to try H2 blocker and report back   abd [place] pending  ua clear  Disc ER precautions      Relevant Orders   US Abdomen Complete   Flank pain    R flank and abd pain  ua clear  abd [place] ordered      Relevant Orders   POCT Urinalysis Dipstick (Automated) (Completed)

## 2021-10-29 NOTE — Assessment & Plan Note (Signed)
Unsure if this is similar to the past  Also some nausea Worse after eating  Not positional  ua is entirely clear  No h/o liver problems  Reviewed labs from late sept-reassuring  abd [place] ordered to look at liver/gb and kidneys  Pending report ER precautions reviewed  Will update if worse  May try pepcid otc to see if it helps nausea

## 2021-10-29 NOTE — Assessment & Plan Note (Signed)
With R flank and upper abd pain  Reassuring exam  Plan to try H2 blocker and report back   abd [place] pending  ua clear  Disc ER precautions

## 2021-10-29 NOTE — Assessment & Plan Note (Signed)
R flank and abd pain  ua clear  abd [place] ordered

## 2021-10-29 NOTE — Patient Instructions (Addendum)
Let's check a urinalysis (for blood or signs of infection)  Drink lots of water   Also check an abdominal ultrasound (to look for liver or gallbladder abnormalities) in light of the nausea  You will get a call to set that up  This may also be muscle spasm  Use heat on/off  Stretch Yoga may be helpful   See chirpractor as planned for your neck as well  We can consider physical therapy also

## 2021-11-09 DIAGNOSIS — M9901 Segmental and somatic dysfunction of cervical region: Secondary | ICD-10-CM | POA: Diagnosis not present

## 2021-11-09 DIAGNOSIS — M9902 Segmental and somatic dysfunction of thoracic region: Secondary | ICD-10-CM | POA: Diagnosis not present

## 2021-11-09 DIAGNOSIS — M5032 Other cervical disc degeneration, mid-cervical region, unspecified level: Secondary | ICD-10-CM | POA: Diagnosis not present

## 2021-11-09 DIAGNOSIS — M791 Myalgia, unspecified site: Secondary | ICD-10-CM | POA: Diagnosis not present

## 2021-11-12 DIAGNOSIS — M5032 Other cervical disc degeneration, mid-cervical region, unspecified level: Secondary | ICD-10-CM | POA: Diagnosis not present

## 2021-11-12 DIAGNOSIS — M9902 Segmental and somatic dysfunction of thoracic region: Secondary | ICD-10-CM | POA: Diagnosis not present

## 2021-11-12 DIAGNOSIS — M9901 Segmental and somatic dysfunction of cervical region: Secondary | ICD-10-CM | POA: Diagnosis not present

## 2021-11-12 DIAGNOSIS — M791 Myalgia, unspecified site: Secondary | ICD-10-CM | POA: Diagnosis not present

## 2021-11-27 ENCOUNTER — Ambulatory Visit
Admission: RE | Admit: 2021-11-27 | Discharge: 2021-11-27 | Disposition: A | Discharge status: Home / Self Care | Payer: BC Managed Care – PPO | Source: Ambulatory Visit | Attending: Family Medicine | Admitting: Family Medicine

## 2021-11-27 DIAGNOSIS — R109 Unspecified abdominal pain: Secondary | ICD-10-CM | POA: Diagnosis not present

## 2021-11-27 DIAGNOSIS — R110 Nausea: Secondary | ICD-10-CM

## 2021-11-30 ENCOUNTER — Encounter: Payer: Self-pay | Admitting: Obstetrics & Gynecology

## 2021-11-30 ENCOUNTER — Other Ambulatory Visit: Payer: Self-pay

## 2021-11-30 ENCOUNTER — Ambulatory Visit (INDEPENDENT_AMBULATORY_CARE_PROVIDER_SITE_OTHER): Payer: BC Managed Care – PPO | Admitting: Obstetrics & Gynecology

## 2021-11-30 ENCOUNTER — Other Ambulatory Visit (HOSPITAL_COMMUNITY)
Admission: RE | Admit: 2021-11-30 | Discharge: 2021-11-30 | Disposition: A | Discharge status: Home / Self Care | Payer: BC Managed Care – PPO | Source: Ambulatory Visit | Attending: Obstetrics & Gynecology | Admitting: Obstetrics & Gynecology

## 2021-11-30 VITALS — BP 118/78 | HR 74 | Resp 16 | Ht 63.5 in | Wt 143.0 lb

## 2021-11-30 DIAGNOSIS — Z01419 Encounter for gynecological examination (general) (routine) without abnormal findings: Secondary | ICD-10-CM

## 2021-11-30 DIAGNOSIS — Z90710 Acquired absence of both cervix and uterus: Secondary | ICD-10-CM

## 2021-11-30 DIAGNOSIS — Z1272 Encounter for screening for malignant neoplasm of vagina: Secondary | ICD-10-CM | POA: Insufficient documentation

## 2021-11-30 NOTE — Progress Notes (Signed)
Anna [name] 1966/02/18 [id number]   History:    55 y.o. G4P4L4  RP:  Established patient presenting for annual gyn exam   HPI: H/O Hysterectomy for fibroids.  No hot flashes, night sweats or vaginal dryness. No pelvic pain.  No pain with IC.  Pap smear Neg 2019. No history of significant abnormal Pap smears. Breasts normal.  Mammography Neg 07/2021.  Colonoscopy 10/2017.    BMI 24.93.   Past medical history,surgical history, family history and social history were all reviewed and documented in the EPIC chart.  Gynecologic History Patient's last menstrual period was 07/11/2013.  Obstetric History OB History  Gravida Para Term Preterm AB Living  4 4 4     4   SAB IAB Ectopic Multiple Live Births               # Outcome Date GA Lbr Len/2nd Weight Sex Delivery Anes PTL Lv  4 Term           3 Term           2 Term           1 Term              ROS: A ROS was performed and pertinent positives and negatives are included in the history.  GENERAL: No fevers or chills. HEENT: No change in vision, no earache, sore throat or sinus congestion. NECK: No pain or stiffness. CARDIOVASCULAR: No chest pain or pressure. No palpitations. PULMONARY: No shortness of breath, cough or wheeze. GASTROINTESTINAL: No abdominal pain, nausea, vomiting or diarrhea, melena or bright red blood per rectum. GENITOURINARY: No urinary frequency, urgency, hesitancy or dysuria. MUSCULOSKELETAL: No joint or muscle pain, no back pain, no recent trauma. DERMATOLOGIC: No rash, no itching, no lesions. ENDOCRINE: No polyuria, polydipsia, no heat or cold intolerance. No recent change in weight. HEMATOLOGICAL: No anemia or easy bruising or bleeding. NEUROLOGIC: No headache, seizures, numbness, tingling or weakness. PSYCHIATRIC: No depression, no loss of interest in normal activity or change in sleep pattern.     Exam:   BP 118/78   Pulse 74   Resp 16   Ht 5' 3.5" (1.613 m)   Wt 143 lb (64.9 kg)   LMP  07/11/2013   BMI 24.93 kg/m   Body mass index is 24.93 kg/m.  General appearance : Well developed well nourished female. No acute distress HEENT: Eyes: no retinal hemorrhage or exudates,  Neck supple, trachea midline, no carotid bruits, no thyroidmegaly Lungs: Clear to auscultation, no rhonchi or wheezes, or rib retractions  Heart: Regular rate and rhythm, no murmurs or gallops Breast:Examined in sitting and supine position were symmetrical in appearance, no palpable masses or tenderness,  no skin retraction, no nipple inversion, no nipple discharge, no skin discoloration, no axillary or supraclavicular lymphadenopathy Abdomen: no palpable masses or tenderness, no rebound or guarding Extremities: no edema or skin discoloration or tenderness  Pelvic: Vulva: Normal             Vagina: No gross lesions or discharge.  Pap reflex done.  Cervix/Uterus absent  Adnexa  Without masses or tenderness  Anus: Normal   Assessment/Plan:  55 y.o. female for annual exam   1. Encounter for Papanicolaou smear of vagina as part of routine gynecological examination H/O Hysterectomy for fibroids.  No hot flashes, night sweats or vaginal dryness. No pelvic pain.  No pain with IC.  Pap smear Neg 2019. No history of significant abnormal Pap smears.  Pap reflex vaginal vault done. Breasts normal.  Mammography Neg 07/2021.  Colonoscopy 10/2017.    BMI 24.93. - Cytology - PAP( [facility])  2. S/P total hysterectomy   Princess Bruins MD, 11:49 AM 11/30/2021

## 2021-12-03 LAB — CYTOLOGY - PAP: Diagnosis: NEGATIVE

## 2021-12-04 ENCOUNTER — Ambulatory Visit: Payer: BC Managed Care – PPO

## 2022-01-01 ENCOUNTER — Telehealth: Payer: Self-pay | Admitting: Family Medicine

## 2022-01-01 NOTE — Telephone Encounter (Signed)
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

## 2022-01-01 NOTE — Telephone Encounter (Signed)
Sending to the front staff to see if there has been a medical record request on this patient. Are we still keeping track of these in the notebook? Please review

## 2022-02-22 ENCOUNTER — Ambulatory Visit (INDEPENDENT_AMBULATORY_CARE_PROVIDER_SITE_OTHER): Payer: BC Managed Care – PPO | Admitting: Family Medicine

## 2022-02-22 ENCOUNTER — Encounter: Payer: Self-pay | Admitting: Family Medicine

## 2022-02-22 ENCOUNTER — Other Ambulatory Visit: Payer: Self-pay

## 2022-02-22 VITALS — BP 122/68 | HR 69 | Temp 97.3°F | Resp 16 | Ht 63.5 in | Wt 143.4 lb

## 2022-02-22 DIAGNOSIS — J3089 Other allergic rhinitis: Secondary | ICD-10-CM | POA: Diagnosis not present

## 2022-02-22 MED ORDER — FLUTICASONE PROPIONATE 50 MCG/ACT NA SUSP: 2.0000 | Freq: Every day | NASAL | 6 refills | Status: DC

## 2022-02-22 NOTE — Patient Instructions (Signed)
Continue the zyrtec  ?Continue the saline spray as needed  ? ?Add generic flonase nasal spray once daily  ?Give it 2 weeks to work  ? ?I placed a referral to ENT ?You will get a call  ? ?If you don't hear from [place] or them in 2 weeks then call our office for the status  ? ? ? ? ?

## 2022-02-22 NOTE — Progress Notes (Signed)
? ?Subjective:  ? ? Patient ID: Anna [name], female    DOB: 02/04/1966, 56 y.o.   MRN: [id number] ? ?This visit occurred during the SARS-CoV-2 public health emergency.  Safety protocols were in place, including screening questions prior to the visit, additional usage of staff PPE, and extensive cleaning of exam room while observing appropriate contact time as indicated for disinfecting solutions.  ? ?HPI ?Pt presents for ENT issues  ? ?Wt Readings from Last 3 Encounters:  ?02/22/22 143 lb 6.4 oz (65 kg)  ?11/30/21 143 lb (64.9 kg)  ?10/29/21 145 lb (65.8 kg)  ? ?25.00 kg/m? ? ?Congestion  ?Blows her nose all day every day ?Bridge of nose is swollen in the mornings  ?Husband says she is snoring  ?Mucous is not green or yellow  ? ?Sometimes has pressure in L maxillary sinus-not bad/brief  ?Ears feels fine (occ pop when swallowing)  ?Throat-not pain but she has pnd and clears her throat  ?In am irritated but not painful  ? ?No cough ?No wheezing  ?No hives recently  ? ? ?? Coming from her allergies  ? ?Bought an air purifier  ?Takes zyrtec 10 mg daily  ?Saline nasal spray  ? ?Has not used steroid nasal spray recently  ? ? ?Patient Active Problem List  ? Diagnosis Date Noted  ? Nausea 10/29/2021  ? Flank pain 10/29/2021  ? Hematoma 06/22/2021  ? Shoulder pain 05/01/2021  ? Neck strain 02/28/2021  ? Hip pain, right 01/22/2021  ? Chronic pain of left thumb 01/22/2021  ? Routine general medical examination at a health care facility 08/04/2020  ? Right lateral abdominal pain 01/04/2020  ? Leg pain, lateral, left 12/03/2018  ? Breast tenderness 06/30/2018  ? Urticaria 06/11/2016  ? Angioedema 06/11/2016  ? Lip swelling 05/07/2016  ? Allergic rhinitis 03/20/2007  ? GERD 03/20/2007  ? ?Past Medical History:  ?Diagnosis Date  ? Allergic rhinitis   ? Facial numbness 01/2005  ? neuro workup  ? Genital warts   ? GERD (gastroesophageal reflux disease)   ? Hiatal hernia   ? EGD- gastritis, esophagitis 09/2004  ? History  of HPV infection   ? Knee MCL sprain 2007  ? ?Past Surgical History:  ?Procedure Laterality Date  ? LAPAROSCOPIC ASSISTED VAGINAL HYSTERECTOMY N/A 07/27/2013  ? Procedure: LAPAROSCOPIC ASSISTED VAGINAL HYSTERECTOMY;  Surgeon: Anastasio Auerbach, MD for leiomyomata  ? ?Social History  ? ?Tobacco Use  ? Smoking status: Never  ? Smokeless tobacco: Never  ?Vaping Use  ? Vaping Use: Never used  ?Substance Use Topics  ? Alcohol use: No  ?  Alcohol/week: 0.0 standard drinks  ? Drug use: No  ? ?Family History  ?Problem Relation Age of Onset  ? Lymphoma Mother   ? Aneurysm Father   ?     cerebral  ? Breast cancer Maternal Aunt   ?     50's  ? Cancer Paternal Aunt   ?     Uterine  ? ?No Known Allergies ?Current Outpatient Medications on File Prior to Visit  ?Medication Sig Dispense Refill  ? CALCIUM-MAGNESIUM-ZINC PO Take 2 capsules by mouth daily.    ? cetirizine (ZYRTEC) 10 MG tablet Take 10 mg by mouth as needed.     ? Cholecalciferol (VITAMIN D3 PO) Take 2,000 Units by mouth daily.     ? diphenhydrAMINE (BENADRYL) 50 MG capsule Take 25 mg by mouth as needed.     ? ?No current facility-administered medications on file prior to  visit.  ?  ?Review of Systems  ?Constitutional:  Negative for activity change, appetite change, fatigue, fever and unexpected weight change.  ?HENT:  Positive for congestion, postnasal drip and rhinorrhea. Negative for ear pain, sinus pressure, sore throat and voice change.   ?Eyes:  Negative for pain, redness and visual disturbance.  ?Respiratory:  Negative for cough, shortness of breath and wheezing.   ?Cardiovascular:  Negative for chest pain and palpitations.  ?Gastrointestinal:  Negative for abdominal pain, blood in stool, constipation and diarrhea.  ?Endocrine: Negative for polydipsia and polyuria.  ?Genitourinary:  Negative for dysuria, frequency and urgency.  ?Musculoskeletal:  Negative for arthralgias, back pain and myalgias.  ?Skin:  Negative for pallor and rash.  ?Allergic/Immunologic:  Negative for environmental allergies.  ?Neurological:  Negative for dizziness, syncope and headaches.  ?Hematological:  Negative for adenopathy. Does not bruise/bleed easily.  ?Psychiatric/Behavioral:  Negative for decreased concentration and dysphoric mood. The patient is not nervous/anxious.   ? ?   ?Objective:  ? Physical Exam ?Constitutional:   ?   General: She is not in acute distress. ?   Appearance: Normal appearance. She is normal weight. She is not ill-appearing or diaphoretic.  ?HENT:  ?   Head: Normocephalic and atraumatic.  ?   Comments: No sinus tenderness ?   Right Ear: Tympanic membrane, ear canal and external ear normal. There is no impacted cerumen.  ?   Left Ear: Tympanic membrane, ear canal and external ear normal. There is no impacted cerumen.  ?   Nose: Rhinorrhea present.  ?   Comments: Boggy nares  ?Injected  ? ?   Mouth/Throat:  ?   Mouth: Mucous membranes are moist.  ?   Pharynx: Oropharynx is clear. No oropharyngeal exudate or posterior oropharyngeal erythema.  ?Eyes:  ?   General:     ?   Right eye: No discharge.     ?   Left eye: No discharge.  ?   Conjunctiva/sclera: Conjunctivae normal.  ?   Pupils: Pupils are equal, round, and reactive to light.  ?Cardiovascular:  ?   Rate and Rhythm: Normal rate and regular rhythm.  ?   Heart sounds: Normal heart sounds.  ?Pulmonary:  ?   Effort: Pulmonary effort is normal. No respiratory distress.  ?   Breath sounds: Normal breath sounds. No stridor. No wheezing, rhonchi or rales.  ?Musculoskeletal:  ?   Cervical back: Normal range of motion and neck supple.  ?   Right lower leg: No edema.  ?   Left lower leg: No edema.  ?Lymphadenopathy:  ?   Cervical: No cervical adenopathy.  ?Skin: ?   Findings: No erythema or rash.  ?Neurological:  ?   Mental Status: She is alert.  ?   Cranial Nerves: No cranial nerve deficit.  ?Psychiatric:     ?   Mood and Affect: Mood normal.  ? ? ? ? ? ?   ?Assessment & Plan:  ? ?Problem List Items Addressed This Visit   ? ?   ? Respiratory  ? Allergic rhinitis - Primary  ?  With congestion /worse at night causing snoring  ? ?Takes zyrtec daily and also uses nasal saline ?Disc allergen avoidance ?Will add flonase ns daily prn during season  ?Ref made to ENT at pt request  ?  ?  ? Relevant Medications  ? fluticasone (FLONASE) 50 MCG/ACT nasal spray  ? Other Relevant Orders  ? Ambulatory referral to ENT  ? ? ?

## 2022-02-24 MED ORDER — FLUTICASONE PROPIONATE 50 MCG/ACT NA SUSP: 2.0000 | Freq: Every day | NASAL | 6 refills | Status: DC

## 2022-02-24 NOTE — Assessment & Plan Note (Signed)
With congestion /worse at night causing snoring  ? ?Takes zyrtec daily and also uses nasal saline ?Disc allergen avoidance ?Will add flonase ns daily prn during season  ?Ref made to ENT at pt request  ?

## 2022-02-28 ENCOUNTER — Encounter: Payer: Self-pay | Admitting: *Deleted

## 2022-04-08 DIAGNOSIS — M62830 Muscle spasm of back: Secondary | ICD-10-CM | POA: Diagnosis not present

## 2022-04-08 DIAGNOSIS — M9902 Segmental and somatic dysfunction of thoracic region: Secondary | ICD-10-CM | POA: Diagnosis not present

## 2022-04-08 DIAGNOSIS — M5032 Other cervical disc degeneration, mid-cervical region, unspecified level: Secondary | ICD-10-CM | POA: Diagnosis not present

## 2022-04-08 DIAGNOSIS — M791 Myalgia, unspecified site: Secondary | ICD-10-CM | POA: Diagnosis not present

## 2022-04-08 DIAGNOSIS — M9901 Segmental and somatic dysfunction of cervical region: Secondary | ICD-10-CM | POA: Diagnosis not present

## 2022-04-23 ENCOUNTER — Ambulatory Visit (INDEPENDENT_AMBULATORY_CARE_PROVIDER_SITE_OTHER): Payer: BC Managed Care – PPO | Admitting: Family Medicine

## 2022-04-23 ENCOUNTER — Encounter: Payer: Self-pay | Admitting: Family Medicine

## 2022-04-23 VITALS — BP 122/64 | HR 72 | Temp 97.7°F | Ht 63.5 in | Wt 145.1 lb

## 2022-04-23 DIAGNOSIS — G8929 Other chronic pain: Secondary | ICD-10-CM | POA: Insufficient documentation

## 2022-04-23 DIAGNOSIS — M25512 Pain in left shoulder: Secondary | ICD-10-CM | POA: Diagnosis not present

## 2022-04-23 DIAGNOSIS — S161XXA Strain of muscle, fascia and tendon at neck level, initial encounter: Secondary | ICD-10-CM

## 2022-04-23 NOTE — Assessment & Plan Note (Signed)
Now R sided/acute on chronic  ?Got better after she made the appt  ?Some mild tenderness of R cervical musculature, nl rom and no bony tenderness ?Adv trial of heat and voltaren gel and memory foam cervical support pillow  ?Chiropractor prn ?Stretching/yoga prn ?If symptoms return, consider imaging  ?If worse , PT or specialist eval ?

## 2022-04-23 NOTE — Assessment & Plan Note (Signed)
On/off ?Does some lifting at the gym  ?Not bothersome right now  ?inst to avoid exercises that hurt  ?Stretch/heat and ice/voltaren gel prn  ?inst to call if symptoms return  ?Would consider PT (has done with other shoulder with success in the past)  ? ?

## 2022-04-23 NOTE — Assessment & Plan Note (Signed)
Right shoulder pain resolved after PT  ?

## 2022-04-23 NOTE — Patient Instructions (Addendum)
If shoulder bothers you more let us know  ?Would consider PT  ? ?Try voltaren gel on shoulder and the neck if needed  ? ?Heat is helpful also  ? ?If neck symptoms return or don't improve let us know  ?Stay active  ? ?If a particular exercise hurts- hold off  ? ?Yoga is good for flexibility and injury prevention  ?Lots of videos - start at home and then sign up for a class if you want  ? ? ? ?

## 2022-04-23 NOTE — Progress Notes (Signed)
? ?Subjective:  ? ? Patient ID: Anna [name], female    DOB: 1966/05/23, 56 y.o.   MRN: [id number] ? ?HPI ?Pt presents with c/o of R sided neck and ear pain  ? ?Wt Readings from Last 3 Encounters:  ?04/23/22 145 lb 2 oz (65.8 kg)  ?02/22/22 143 lb 6.4 oz (65 kg)  ?11/30/21 143 lb (64.9 kg)  ? ?25.30 kg/m? ? ?Has been going to chiropractor -nothing is helping  ?Occ does a manipulation  ? ?Neck hurts behind her R ear  ?Dull pain  ?Occ tight  ?Triggered by moving head/ worse after lying down  ?Was tender to the touch (not today)  ?Does not radiate to hands  ?Better than it was a few days ago  ? ?No internal ear pain  ?No change in hearing  ?No tinnitus  ? ? ?Neck got a little swollen after using hair dye in the area ?May have made it worse  ? ?Used some heat on 'Sunday for a few hours  ?Helped a bit  ?Did not take otc med  ? ?Patient Active Problem List  ? Diagnosis Date Noted  ? Left shoulder pain 04/23/2022  ? Flank pain 10/29/2021  ? Hematoma 06/22/2021  ? Neck strain 02/28/2021  ? Hip pain, right 01/22/2021  ? Chronic pain of left thumb 01/22/2021  ? Routine general medical examination at a health care facility 08/04/2020  ? Right lateral abdominal pain 01/04/2020  ? Leg pain, lateral, left 12/03/2018  ? Breast tenderness 06/30/2018  ? Urticaria 06/11/2016  ? Angioedema 06/11/2016  ? Lip swelling 05/07/2016  ? Allergic rhinitis 03/20/2007  ? GERD 03/20/2007  ? ?Past Medical History:  ?Diagnosis Date  ? Allergic rhinitis   ? Facial numbness 01/2005  ? neuro workup  ? Genital warts   ? GERD (gastroesophageal reflux disease)   ? Hiatal hernia   ? EGD- gastritis, esophagitis 09/2004  ? History of HPV infection   ? Knee MCL sprain 2007  ? ?Past Surgical History:  ?Procedure Laterality Date  ? LAPAROSCOPIC ASSISTED VAGINAL HYSTERECTOMY N/A 07/27/2013  ? Procedure: LAPAROSCOPIC ASSISTED VAGINAL HYSTERECTOMY;  Surgeon: Timothy P Fontaine, MD for leiomyomata  ? ?Social History  ? ?Tobacco Use  ? Smoking status: Never   ? Smokeless tobacco: Never  ?Vaping Use  ? Vaping Use: Never used  ?Substance Use Topics  ? Alcohol use: No  ?  Alcohol/week: 0.0 standard drinks  ? Drug use: No  ? ?Family History  ?Problem Relation Age of Onset  ? Lymphoma Mother   ? Aneurysm Father   ?     cerebral  ? Breast cancer Maternal Aunt   ?     50'$ 's  ? Cancer Paternal Aunt   ?     Uterine  ? ?No Known Allergies ?Current Outpatient Medications on File Prior to Visit  ?Medication Sig Dispense Refill  ? CALCIUM-MAGNESIUM-ZINC PO Take 2 capsules by mouth daily.    ? cetirizine (ZYRTEC) 10 MG tablet Take 10 mg by mouth as needed.     ? Cholecalciferol (VITAMIN D3 PO) Take 2,000 Units by mouth daily.     ? diphenhydrAMINE (BENADRYL) 50 MG capsule Take 25 mg by mouth as needed.     ? fluticasone (FLONASE) 50 MCG/ACT nasal spray Place 2 sprays into both nostrils daily. 16 g 6  ? ?No current facility-administered medications on file prior to visit.  ?  ? ? ?Review of Systems  ?Constitutional:  Negative for activity change, appetite change,  fatigue, fever and unexpected weight change.  ?HENT:  Negative for congestion, ear pain, rhinorrhea, sinus pressure and sore throat.   ?Eyes:  Negative for pain, redness and visual disturbance.  ?Respiratory:  Negative for cough, shortness of breath and wheezing.   ?Cardiovascular:  Negative for chest pain and palpitations.  ?Gastrointestinal:  Negative for abdominal pain, blood in stool, constipation and diarrhea.  ?Endocrine: Negative for polydipsia and polyuria.  ?Genitourinary:  Negative for dysuria, frequency and urgency.  ?Musculoskeletal:  Positive for neck pain. Negative for arthralgias, back pain and myalgias.  ?Skin:  Negative for pallor and rash.  ?Allergic/Immunologic: Negative for environmental allergies.  ?Neurological:  Negative for dizziness, syncope and headaches.  ?Hematological:  Negative for adenopathy. Does not bruise/bleed easily.  ?Psychiatric/Behavioral:  Negative for decreased concentration and  dysphoric mood. The patient is not nervous/anxious.   ? ?   ?Objective:  ? Physical Exam ?Constitutional:   ?   General: She is not in acute distress. ?   Appearance: Normal appearance. She is normal weight. She is not ill-appearing.  ?HENT:  ?   Head: Normocephalic and atraumatic.  ?   Comments: No temporal or ear tenderness  ?No mastoid tenderness  ?   Right Ear: Tympanic membrane, ear canal and external ear normal. There is no impacted cerumen.  ?   Left Ear: Tympanic membrane, ear canal and external ear normal. There is no impacted cerumen.  ?   Mouth/Throat:  ?   Mouth: Mucous membranes are moist.  ?Eyes:  ?   General: No scleral icterus.    ?   Right eye: No discharge.     ?   Left eye: No discharge.  ?   Conjunctiva/sclera: Conjunctivae normal.  ?   Pupils: Pupils are equal, round, and reactive to light.  ?Neck:  ?   Thyroid: No thyroid mass, thyromegaly or thyroid tenderness.  ?   Vascular: No carotid bruit.  ?   Comments: Mild tenderness of R cervical musculature  ? ?Full range of motion without pain  ? ?Cardiovascular:  ?   Rate and Rhythm: Normal rate and regular rhythm.  ?   Heart sounds: Normal heart sounds.  ?Pulmonary:  ?   Effort: Pulmonary effort is normal. No respiratory distress.  ?   Breath sounds: Normal breath sounds. No stridor. No wheezing or rales.  ?Musculoskeletal:  ?   Cervical back: Normal range of motion and neck supple. Tenderness present. No erythema, rigidity or crepitus. Muscular tenderness present. No pain with movement or spinous process tenderness.  ?   Comments: Shoulder left: ?No deformity/swelling/warmth or erythema  ?No crepitus  ?No obvious effusion  ?Abduction : pain at full abduction/mild  ?Hawking test : neg ?Neer test :neg  ?Internal rotation : discomfort with full  ?External rotation : full ?Tenderness :none  ?Normal grip and hand dexterity ?  ?  ?Lymphadenopathy:  ?   Cervical: No cervical adenopathy.  ?Skin: ?   General: Skin is warm and dry.  ?   Findings: No  bruising, erythema or rash.  ?Neurological:  ?   Mental Status: She is alert.  ?   Cranial Nerves: No cranial nerve deficit.  ?   Motor: No weakness.  ?   Deep Tendon Reflexes: Reflexes normal.  ?Psychiatric:     ?   Mood and Affect: Mood normal.  ? ? ? ? ? ?   ?Assessment & Plan:  ? ?Problem List Items Addressed This Visit   ? ?  ?  Musculoskeletal and Integument  ? Neck strain - Primary  ?  Now R sided/acute on chronic  ?Got better after she made the appt  ?Some mild tenderness of R cervical musculature, nl rom and no bony tenderness ?Adv trial of heat and voltaren gel and memory foam cervical support pillow  ?Chiropractor prn ?Stretching/yoga prn ?If symptoms return, consider imaging  ?If worse , PT or specialist eval ? ?  ?  ?  ? Other  ? Left shoulder pain  ?  On/off ?Does some lifting at the gym  ?Not bothersome right now  ?inst to avoid exercises that hurt  ?Stretch/heat and ice/voltaren gel prn  ?inst to call if symptoms return  ?Would consider PT (has done with other shoulder with success in the past)  ? ? ?  ?  ? ? ? ?

## 2022-05-21 ENCOUNTER — Telehealth: Payer: Self-pay | Admitting: Family Medicine

## 2022-05-21 DIAGNOSIS — M25512 Pain in left shoulder: Secondary | ICD-10-CM

## 2022-05-21 DIAGNOSIS — S161XXA Strain of muscle, fascia and tendon at neck level, initial encounter: Secondary | ICD-10-CM

## 2022-05-21 NOTE — Telephone Encounter (Signed)
I ordered a CS film since she has not had that done yet  Can set up a time to come to the office   I placed a PT order for shoulder and neck pain  If symptoms changed please let me know

## 2022-05-21 NOTE — Telephone Encounter (Signed)
   Reason for Referral Request: PT Right shoulder (see visit from 5.2.23)  Has patient been seen PCP for this complaint?  Y, 5.2.23  Patient scheduled on:   Yes, please find out following information.  Referral for which specialty: Physical therapy  Preferred office/provider:  [facility]   **Patient also wanted to know if she needed to get an x-ray first, or just proceed with physical therapy

## 2022-05-22 NOTE — Telephone Encounter (Signed)
Left VM requesting pt to call the office back 

## 2022-05-23 NOTE — Telephone Encounter (Signed)
Left VM requesting pt to call the office back 

## 2022-05-27 NOTE — Telephone Encounter (Signed)
Patient was notified. Will call back to schedule an appointment.

## 2022-05-28 DIAGNOSIS — R0981 Nasal congestion: Secondary | ICD-10-CM | POA: Diagnosis not present

## 2022-05-28 DIAGNOSIS — J342 Deviated nasal septum: Secondary | ICD-10-CM | POA: Diagnosis not present

## 2022-05-28 DIAGNOSIS — J309 Allergic rhinitis, unspecified: Secondary | ICD-10-CM | POA: Diagnosis not present

## 2022-05-28 DIAGNOSIS — J343 Hypertrophy of nasal turbinates: Secondary | ICD-10-CM | POA: Diagnosis not present

## 2022-05-29 ENCOUNTER — Other Ambulatory Visit: Payer: Self-pay | Admitting: Family Medicine

## 2022-05-29 ENCOUNTER — Ambulatory Visit (INDEPENDENT_AMBULATORY_CARE_PROVIDER_SITE_OTHER)
Admission: RE | Admit: 2022-05-29 | Discharge: 2022-05-29 | Disposition: A | Discharge status: Home / Self Care | Payer: BC Managed Care – PPO | Source: Ambulatory Visit | Attending: Family Medicine | Admitting: Family Medicine

## 2022-05-29 ENCOUNTER — Telehealth: Payer: Self-pay | Admitting: Family Medicine

## 2022-05-29 ENCOUNTER — Inpatient Hospital Stay (HOSPITAL_COMMUNITY): Admission: RE | Admit: 2022-05-29 | Payer: BC Managed Care – PPO | Source: Ambulatory Visit

## 2022-05-29 ENCOUNTER — Other Ambulatory Visit: Payer: BC Managed Care – PPO

## 2022-05-29 DIAGNOSIS — M25512 Pain in left shoulder: Secondary | ICD-10-CM

## 2022-05-29 DIAGNOSIS — G8929 Other chronic pain: Secondary | ICD-10-CM

## 2022-05-29 DIAGNOSIS — S161XXA Strain of muscle, fascia and tendon at neck level, initial encounter: Secondary | ICD-10-CM

## 2022-05-29 DIAGNOSIS — M19012 Primary osteoarthritis, left shoulder: Secondary | ICD-10-CM | POA: Diagnosis not present

## 2022-05-29 DIAGNOSIS — M542 Cervicalgia: Secondary | ICD-10-CM | POA: Diagnosis not present

## 2022-05-29 NOTE — Telephone Encounter (Signed)
Here for CS xray already ordered Wants a L shoulder film also since shoulder hurts

## 2022-05-31 ENCOUNTER — Telehealth: Payer: Self-pay

## 2022-05-31 NOTE — Telephone Encounter (Signed)
-----   Message from Abner Greenspan, MD sent at 05/30/2022  4:44 PM EDT ----- There is some degenerative change in lower neck that could add to arm and shoulder complaints  I recommend going forward with PT as planned

## 2022-05-31 NOTE — Telephone Encounter (Signed)
I left a message for the patient to return my call.

## 2022-06-05 NOTE — Telephone Encounter (Signed)
LMTCB to discuss results and if she is going to do PT

## 2022-06-05 NOTE — Telephone Encounter (Signed)
Pt called back, I let her know the message below and she said she is goig to schedule PT. She is going out of town and said she will call them back to schedule when she gets back in town

## 2022-06-05 NOTE — Telephone Encounter (Signed)
Noted  

## 2022-07-28 DIAGNOSIS — J069 Acute upper respiratory infection, unspecified: Secondary | ICD-10-CM | POA: Diagnosis not present

## 2022-08-09 ENCOUNTER — Encounter: Payer: Self-pay | Admitting: Family Medicine

## 2022-08-09 ENCOUNTER — Ambulatory Visit (INDEPENDENT_AMBULATORY_CARE_PROVIDER_SITE_OTHER): Payer: BC Managed Care – PPO | Admitting: Family Medicine

## 2022-08-09 VITALS — BP 118/68 | HR 68 | Temp 97.9°F | Resp 16 | Ht 63.5 in | Wt 148.4 lb

## 2022-08-09 DIAGNOSIS — J0100 Acute maxillary sinusitis, unspecified: Secondary | ICD-10-CM | POA: Diagnosis not present

## 2022-08-09 DIAGNOSIS — J3089 Other allergic rhinitis: Secondary | ICD-10-CM

## 2022-08-09 DIAGNOSIS — J019 Acute sinusitis, unspecified: Secondary | ICD-10-CM | POA: Insufficient documentation

## 2022-08-09 MED ORDER — AMOXICILLIN-POT CLAVULANATE 875-125 MG PO TABS: 1.0000 | ORAL_TABLET | Freq: Two times a day (BID) | ORAL | 0 refills | Status: DC

## 2022-08-09 NOTE — Progress Notes (Unsigned)
Subjective:    Patient ID: Anna [name], female    DOB: 09-Nov-1966, 56 y.o.   MRN: [id number]  HPI Pt presents for nasal congestion   Wt Readings from Last 3 Encounters:  08/09/22 148 lb 6.4 oz (67.3 kg)  04/23/22 145 lb 2 oz (65.8 kg)  02/22/22 143 lb 6.4 oz (65 kg)   25.88 kg/m  Congestion in setting of allergic rhinitis  Worse at night   Was doing better until after trip to [place] in July (was gone for a mo)  Sore throat  Lasted about a week  Then woke up with very bad congestion  Mucous was yellow/green and blood  Coughing also -productive   She went to [facility] in [place]   (did not test for covid because it was too late to treat)  Px claritin D Pill for cough  Inhaler Afrin- to use for 3 d max No abx    Symptoms improved all except nasal congestion    She saw ENT in June Zyrtec, steroid ns    Claritin D now - 4 more days  Then zyrtec  Has nasonex - using instead of flonase   Congestion is worse on the R  Hard to get air through  Sinus pain is improved   Patient Active Problem List   Diagnosis Date Noted   Acute sinusitis 08/09/2022   Left shoulder pain 04/23/2022   Flank pain 10/29/2021   Hematoma 06/22/2021   Neck strain 02/28/2021   Hip pain, right 01/22/2021   Chronic pain of left thumb 01/22/2021   Routine general medical examination at a health care facility 08/04/2020   Right lateral abdominal pain 01/04/2020   Leg pain, lateral, left 12/03/2018   Breast tenderness 06/30/2018   Urticaria 06/11/2016   Angioedema 06/11/2016   Lip swelling 05/07/2016   Allergic rhinitis 03/20/2007   GERD 03/20/2007   Past Medical History:  Diagnosis Date   Allergic rhinitis    Facial numbness 01/2005   neuro workup   Genital warts    GERD (gastroesophageal reflux disease)    Hiatal hernia    EGD- gastritis, esophagitis 09/2004   History of HPV infection    Knee MCL sprain 2007   Past Surgical History:  Procedure Laterality Date    LAPAROSCOPIC ASSISTED VAGINAL HYSTERECTOMY N/A 07/27/2013   Procedure: LAPAROSCOPIC ASSISTED VAGINAL HYSTERECTOMY;  Surgeon: Anastasio Auerbach, MD for leiomyomata   Social History   Tobacco Use   Smoking status: Never   Smokeless tobacco: Never  Vaping Use   Vaping Use: Never used  Substance Use Topics   Alcohol use: No    Alcohol/week: 0.0 standard drinks of alcohol   Drug use: No   Family History  Problem Relation Age of Onset   Lymphoma Mother    Aneurysm Father        cerebral   Breast cancer Maternal Aunt        50's   Cancer Paternal Aunt        Uterine   No Known Allergies Current Outpatient Medications on File Prior to Visit  Medication Sig Dispense Refill   CALCIUM-MAGNESIUM-ZINC PO Take 2 capsules by mouth daily.     cetirizine (ZYRTEC) 10 MG tablet Take 10 mg by mouth as needed.      Cholecalciferol (VITAMIN D3 PO) Take 2,000 Units by mouth daily.      diphenhydrAMINE (BENADRYL) 50 MG capsule Take 25 mg by mouth as needed.      mometasone (NASONEX)  50 MCG/ACT nasal spray Place 2 sprays into the nose daily.     No current facility-administered medications on file prior to visit.    Review of Systems  Constitutional:  Negative for activity change, appetite change, fatigue, fever and unexpected weight change.  HENT:  Positive for congestion, rhinorrhea, sinus pressure and sinus pain. Negative for ear pain, sore throat and voice change.   Eyes:  Negative for pain, redness and visual disturbance.  Respiratory:  Negative for cough, shortness of breath and wheezing.   Cardiovascular:  Negative for chest pain and palpitations.  Gastrointestinal:  Negative for abdominal pain, blood in stool, constipation and diarrhea.  Endocrine: Negative for polydipsia and polyuria.  Genitourinary:  Negative for dysuria, frequency and urgency.  Musculoskeletal:  Negative for arthralgias, back pain and myalgias.  Skin:  Negative for pallor and rash.  Allergic/Immunologic: Negative for  environmental allergies.  Neurological:  Negative for dizziness, syncope and headaches.  Hematological:  Negative for adenopathy. Does not bruise/bleed easily.  Psychiatric/Behavioral:  Negative for decreased concentration and dysphoric mood. The patient is not nervous/anxious.        Objective:   Physical Exam Constitutional:      General: She is not in acute distress.    Appearance: Normal appearance. She is well-developed and normal weight. She is not ill-appearing.  HENT:     Head: Normocephalic and atraumatic.     Comments: Nares are injected and congested  Clear pnd   Bilateral maxillary sinus tenderness /mild     Right Ear: Tympanic membrane and external ear normal.     Left Ear: Tympanic membrane, ear canal and external ear normal.     Nose: Congestion and rhinorrhea present.     Mouth/Throat:     Mouth: Mucous membranes are moist.     Pharynx: No oropharyngeal exudate or posterior oropharyngeal erythema.     Comments: Clear pnd Eyes:     General:        Right eye: No discharge.        Left eye: No discharge.     Conjunctiva/sclera: Conjunctivae normal.     Pupils: Pupils are equal, round, and reactive to light.  Cardiovascular:     Rate and Rhythm: Normal rate and regular rhythm.  Pulmonary:     Effort: Pulmonary effort is normal. No respiratory distress.     Breath sounds: Normal breath sounds. No stridor. No wheezing, rhonchi or rales.  Musculoskeletal:     Cervical back: Normal range of motion and neck supple.  Lymphadenopathy:     Cervical: No cervical adenopathy.  Skin:    General: Skin is warm and dry.     Findings: No rash.  Neurological:     Mental Status: She is alert.     Cranial Nerves: No cranial nerve deficit.  Psychiatric:        Mood and Affect: Mood normal.           Assessment & Plan:   Problem List Items Addressed This Visit       Respiratory   Acute sinusitis - Primary    S/p prolonged bout of congestion and also allergies Px  augmentin  Symptom control discussed inst to inc nasonex to bid for a week and then go back to daily  Update if not starting to improve in a week or if worsening        Relevant Medications   mometasone (NASONEX) 50 MCG/ACT nasal spray   amoxicillin-clavulanate (AUGMENTIN) 875-125 MG tablet  Allergic rhinitis    With ongoing congestion  S/s of bact sinus infection-will tx inst to inc nasonex to bid for 7 d then return to daily  Go back to zyrtec for antihistamine

## 2022-08-09 NOTE — Patient Instructions (Addendum)
Take augmentin for bacterial sinus infection  Drink lots of fluids   Increase your nasonex to twice daily for a week -then return to once daily   Go back to zyrtec daily   Nasal saline spray is ok to use any time

## 2022-08-11 NOTE — Assessment & Plan Note (Signed)
With ongoing congestion  S/s of bact sinus infection-will tx inst to inc nasonex to bid for 7 d then return to daily  Go back to zyrtec for antihistamine

## 2022-08-11 NOTE — Assessment & Plan Note (Signed)
S/p prolonged bout of congestion and also allergies Px augmentin  Symptom control discussed inst to inc nasonex to bid for a week and then go back to daily  Update if not starting to improve in a week or if worsening

## 2022-09-15 ENCOUNTER — Telehealth: Payer: Self-pay | Admitting: Family Medicine

## 2022-09-15 DIAGNOSIS — Z0000 Encounter for general adult medical examination without abnormal findings: Secondary | ICD-10-CM

## 2022-09-15 NOTE — Telephone Encounter (Signed)
-----   Message from Ellamae Sia sent at 09/03/2022  3:45 PM EDT ----- Regarding: Lab orders for Monday, 9.25.23 Patient is scheduled for CPX labs, please order future labs, Thanks , Karna Christmas

## 2022-09-16 ENCOUNTER — Other Ambulatory Visit (INDEPENDENT_AMBULATORY_CARE_PROVIDER_SITE_OTHER): Payer: BC Managed Care – PPO

## 2022-09-16 DIAGNOSIS — Z0000 Encounter for general adult medical examination without abnormal findings: Secondary | ICD-10-CM | POA: Diagnosis not present

## 2022-09-16 LAB — CBC WITH DIFFERENTIAL/PLATELET
Basophils Absolute: 0.1 10*3/uL (ref 0.0–0.1)
Basophils Relative: 1.2 % (ref 0.0–3.0)
Eosinophils Absolute: 0.4 10*3/uL (ref 0.0–0.7)
Eosinophils Relative: 7.4 % — ABNORMAL HIGH (ref 0.0–5.0)
HCT: 38.4 % (ref 36.0–46.0)
Hemoglobin: 12.7 g/dL (ref 12.0–15.0)
Lymphocytes Relative: 34.3 % (ref 12.0–46.0)
Lymphs Abs: 1.7 10*3/uL (ref 0.7–4.0)
MCHC: 33.2 g/dL (ref 30.0–36.0)
MCV: 86.8 fl (ref 78.0–100.0)
Monocytes Absolute: 0.6 10*3/uL (ref 0.1–1.0)
Monocytes Relative: 12.4 % — ABNORMAL HIGH (ref 3.0–12.0)
Neutro Abs: 2.2 10*3/uL (ref 1.4–7.7)
Neutrophils Relative %: 44.7 % (ref 43.0–77.0)
Platelets: 353 10*3/uL (ref 150.0–400.0)
RBC: 4.42 Mil/uL (ref 3.87–5.11)
RDW: 13.6 % (ref 11.5–15.5)
WBC: 4.9 10*3/uL (ref 4.0–10.5)

## 2022-09-16 LAB — COMPREHENSIVE METABOLIC PANEL
ALT: 10 U/L (ref 0–35)
AST: 10 U/L (ref 0–37)
Albumin: 3.8 g/dL (ref 3.5–5.2)
Alkaline Phosphatase: 62 U/L (ref 39–117)
BUN: 17 mg/dL (ref 6–23)
CO2: 27 mEq/L (ref 19–32)
Calcium: 8.8 mg/dL (ref 8.4–10.5)
Chloride: 105 mEq/L (ref 96–112)
Creatinine, Ser: 0.77 mg/dL (ref 0.40–1.20)
GFR: 87.07 mL/min (ref >60.00)
Glucose, Bld: 84 mg/dL (ref 70–99)
Potassium: 4.2 mEq/L (ref 3.5–5.1)
Sodium: 139 mEq/L (ref 135–145)
Total Bilirubin: 0.3 mg/dL (ref 0.2–1.2)
Total Protein: 6.6 g/dL (ref 6.0–8.3)

## 2022-09-16 LAB — LIPID PANEL
Cholesterol: 189 mg/dL (ref 0–200)
HDL: 54.7 mg/dL (ref >39.00)
LDL Cholesterol: 123 mg/dL — ABNORMAL HIGH (ref 0–99)
NonHDL: 134.51
Total CHOL/HDL Ratio: 3
Triglycerides: 57 mg/dL (ref 0.0–149.0)
VLDL: 11.4 mg/dL (ref 0.0–40.0)

## 2022-09-16 LAB — TSH: TSH: 1.36 u[IU]/mL (ref 0.35–5.50)

## 2022-09-18 ENCOUNTER — Other Ambulatory Visit: Payer: Self-pay | Admitting: Family Medicine

## 2022-09-18 DIAGNOSIS — Z1231 Encounter for screening mammogram for malignant neoplasm of breast: Secondary | ICD-10-CM

## 2022-09-23 ENCOUNTER — Encounter: Payer: Self-pay | Admitting: Family Medicine

## 2022-09-23 ENCOUNTER — Ambulatory Visit (INDEPENDENT_AMBULATORY_CARE_PROVIDER_SITE_OTHER): Payer: BC Managed Care – PPO | Admitting: Family Medicine

## 2022-09-23 VITALS — BP 112/62 | HR 78 | Temp 97.4°F | Ht 63.25 in | Wt 146.8 lb

## 2022-09-23 DIAGNOSIS — Z0000 Encounter for general adult medical examination without abnormal findings: Secondary | ICD-10-CM | POA: Diagnosis not present

## 2022-09-23 DIAGNOSIS — L500 Allergic urticaria: Secondary | ICD-10-CM

## 2022-09-23 MED ORDER — EPINEPHRINE 0.3 MG/0.3ML IJ SOAJ: 0.3000 mg | INTRAMUSCULAR | 3 refills | Status: AC | PRN

## 2022-09-23 NOTE — Assessment & Plan Note (Signed)
Reviewed health habits including diet and exercise and skin cancer prevention Reviewed appropriate screening tests for age  Also reviewed health mt list, fam hx and immunization status , as well as social and family history   See HPI Labs reviewed  Good self care  Declines flu shot  Declines shingrix shot  Mammogram scheduled for 09/27/22  Pap utd 11/2021 sees gyn Taking vit D for bone health Colonoscopy utd 10/2017  Discussed goals for diet for lipids

## 2022-09-23 NOTE — Assessment & Plan Note (Signed)
Still has occ lip swelling and/or hives  Wants to keep epi pen on hand- refilled  Has had neg allergy w/u  ? If non allergic cause  Sometimes when traveling

## 2022-09-23 NOTE — Patient Instructions (Addendum)
For cholesterol Avoid red meat/ fried foods/ egg yolks/ fatty breakfast meats/ butter, cheese and high fat dairy/ and shellfish   Watch the hives and lip swelling  Keep epi pen on hand - if you get swelling of throat/mouth/tongue or any shortness of breath - use it   Continue taking zyrtec 10 mg daily (generic is fine)   Schedule fasting labs in 3 months

## 2022-09-23 NOTE — Progress Notes (Signed)
Subjective:    Patient ID: Anna [name], female    DOB: [birth year], 56 y.o.   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)  Also some stretching      Immunization History  Administered Date(s) Administered   Influenza Whole 09/22/2006   Influenza,inj,Quad PF,6+ Mos 11/07/2013   PFIZER(Purple Top)SARS-COV-2 Vaccination 03/06/2020, 03/29/2020, 11/14/2020   PPD Test 11/07/2013   Td 08/27/2005   Tdap 04/26/2013   Health Maintenance Due  Topic Date Due   COVID-19 Vaccine (4 - Pfizer series) 01/09/2021   INFLUENZA VACCINE  07/23/2022   MAMMOGRAM  08/17/2022   Flu shot - declines   Mammogram was 07/2021 , has it scheduled 10/6  Self breast exam: no lumps but does not check every month  M aunt had breast cancer   Mother had lymphoma    Father cerebral aneurysm   Declines shingrix vaccine   Pap 11/2021 neg from gyn Goes every year    Supplements : vitamin D3  2000 iu daily   Colonoscopy 10/2017 with 10 yr recall   BP Readings from Last 3 Encounters:  09/23/22 112/62  08/09/22 118/68  04/23/22 122/64   Pulse Readings from Last 3 Encounters:  09/23/22 78  08/09/22 68  04/23/22 72    Occ wakes up with a swollen lip  Occ hives on legs  Saw allergist- never found anything / all tests were negative  Takes zyrtec as needed     Cholesterol Lab Results  Component Value Date   CHOL 189 09/16/2022   CHOL 187  09/12/2021   CHOL 181 08/07/2020   Lab Results  Component Value Date   HDL 54.70 09/16/2022   HDL 54.80 09/12/2021   HDL 57.70 08/07/2020   Lab Results  Component Value Date   LDLCALC 123 (H) 09/16/2022   LDLCALC 117 (H) 09/12/2021   LDLCALC 105 (H) 08/07/2020   Lab Results  Component Value Date   TRIG 57.0 09/16/2022   TRIG 75.0 09/12/2021   TRIG 91.0 08/07/2020   Lab Results  Component Value Date   CHOLHDL 3 09/16/2022   CHOLHDL 3 09/12/2021   CHOLHDL 3 08/07/2020   Lab Results  Component Value Date   LDLDIRECT 123.8 10/13/2008   Avoids red meat Avoids processed meats   Eating more tuna  Olive oil  Salmon   Avoids fried foods     Other labs Results for orders placed or performed in visit on 09/16/22  CBC with Differential/Platelet  Result Value Ref Range   WBC 4.9 4.0 - 10.5 K/uL   RBC 4.42 3.87 - 5.11 Mil/uL   Hemoglobin 12.7 12.0 - 15.0 g/dL   HCT 38.4 36.0 - 46.0 %   MCV 86.8 78.0 - 100.0 fl   MCHC 33.2 30.0 - 36.0 g/dL  RDW 13.6 11.5 - 15.5 %   Platelets 353.0 150.0 - 400.0 K/uL   Neutrophils Relative % 44.7 43.0 - 77.0 %   Lymphocytes Relative 34.3 12.0 - 46.0 %   Monocytes Relative 12.4 (H) 3.0 - 12.0 %   Eosinophils Relative 7.4 (H) 0.0 - 5.0 %   Basophils Relative 1.2 0.0 - 3.0 %   Neutro Abs 2.2 1.4 - 7.7 K/uL   Lymphs Abs 1.7 0.7 - 4.0 K/uL   Monocytes Absolute 0.6 0.1 - 1.0 K/uL   Eosinophils Absolute 0.4 0.0 - 0.7 K/uL   Basophils Absolute 0.1 0.0 - 0.1 K/uL  Comprehensive metabolic panel  Result Value Ref Range   Sodium 139 135 - 145 mEq/L   Potassium 4.2 3.5 - 5.1 mEq/L   Chloride 105 96 - 112 mEq/L   CO2 27 19 - 32 mEq/L   Glucose, Bld 84 70 - 99 mg/dL   BUN 17 6 - 23 mg/dL   Creatinine, Ser 0.77 0.40 - 1.20 mg/dL   Total Bilirubin 0.3 0.2 - 1.2 mg/dL   Alkaline Phosphatase 62 39 - 117 U/L   AST 10 0 - 37 U/L   ALT 10 0 - 35 U/L   Total Protein 6.6 6.0 - 8.3 g/dL   Albumin 3.8 3.5 - 5.2 g/dL   GFR 87.07 >60.00 mL/min    Calcium 8.8 8.4 - 10.5 mg/dL  Lipid panel  Result Value Ref Range   Cholesterol 189 0 - 200 mg/dL   Triglycerides 57.0 0.0 - 149.0 mg/dL   HDL 54.70 >39.00 mg/dL   VLDL 11.4 0.0 - 40.0 mg/dL   LDL Cholesterol 123 (H) 0 - 99 mg/dL   Total CHOL/HDL Ratio 3    NonHDL 134.51   TSH  Result Value Ref Range   TSH 1.36 0.35 - 5.50 uIU/mL    Patient Active Problem List   Diagnosis Date Noted   Left shoulder pain 04/23/2022   Flank pain 10/29/2021   Hematoma 06/22/2021   Neck strain 02/28/2021   Hip pain, right 01/22/2021   Chronic pain of left thumb 01/22/2021   Routine general medical examination at a health care facility 08/04/2020   Right lateral abdominal pain 01/04/2020   Leg pain, lateral, left 12/03/2018   Breast tenderness 06/30/2018   Urticaria 06/11/2016   Angioedema 06/11/2016   Lip swelling 05/07/2016   Allergic rhinitis 03/20/2007   GERD 03/20/2007   Past Medical History:  Diagnosis Date   Allergic rhinitis    Facial numbness 01/2005   neuro workup   Genital warts    GERD (gastroesophageal reflux disease)    Hiatal hernia    EGD- gastritis, esophagitis 09/2004   History of HPV infection    Knee MCL sprain 2007   Past Surgical History:  Procedure Laterality Date   LAPAROSCOPIC ASSISTED VAGINAL HYSTERECTOMY N/A 07/27/2013   Procedure: LAPAROSCOPIC ASSISTED VAGINAL HYSTERECTOMY;  Surgeon: Anastasio Auerbach, MD for leiomyomata   Social History   Tobacco Use   Smoking status: Never   Smokeless tobacco: Never  Vaping Use   Vaping Use: Never used  Substance Use Topics   Alcohol use: No    Alcohol/week: 0.0 standard drinks of alcohol   Drug use: No   Family History  Problem Relation Age of Onset   Lymphoma Mother    Aneurysm Father        cerebral   Breast cancer Maternal Aunt        50's   Cancer Paternal Aunt  Uterine   No Known Allergies Current Outpatient Medications on File Prior to Visit  Medication Sig Dispense Refill    CALCIUM-MAGNESIUM-ZINC PO Take 2 capsules by mouth daily.     cetirizine (ZYRTEC) 10 MG tablet Take 10 mg by mouth as needed.      Cholecalciferol (VITAMIN D3 PO) Take 2,000 Units by mouth daily.      diphenhydrAMINE (BENADRYL) 50 MG capsule Take 25 mg by mouth as needed.      mometasone (NASONEX) 50 MCG/ACT nasal spray Place 2 sprays into the nose daily.     No current facility-administered medications on file prior to visit.     Review of Systems  Constitutional:  Negative for activity change, appetite change, fatigue, fever and unexpected weight change.  HENT:  Negative for congestion, ear pain, rhinorrhea, sinus pressure and sore throat.   Eyes:  Negative for pain, redness and visual disturbance.  Respiratory:  Negative for cough, shortness of breath and wheezing.   Cardiovascular:  Negative for chest pain and palpitations.  Gastrointestinal:  Negative for abdominal pain, blood in stool, constipation and diarrhea.  Endocrine: Negative for polydipsia and polyuria.  Genitourinary:  Negative for dysuria, frequency and urgency.  Musculoskeletal:  Negative for arthralgias, back pain and myalgias.  Skin:  Negative for pallor and rash.       Occ gets hives or a swollen lip   Allergic/Immunologic: Negative for environmental allergies.  Neurological:  Negative for dizziness, syncope and headaches.  Hematological:  Negative for adenopathy. Does not bruise/bleed easily.  Psychiatric/Behavioral:  Negative for decreased concentration and dysphoric mood. The patient is not nervous/anxious.        Objective:   Physical Exam Constitutional:      General: She is not in acute distress.    Appearance: Normal appearance. She is well-developed and normal weight. She is not ill-appearing or diaphoretic.  HENT:     Head: Normocephalic and atraumatic.     Right Ear: Tympanic membrane, ear canal and external ear normal.     Left Ear: Tympanic membrane, ear canal and external ear normal.     Nose:  Nose normal. No congestion.     Mouth/Throat:     Mouth: Mucous membranes are moist.     Pharynx: Oropharynx is clear. No posterior oropharyngeal erythema.  Eyes:     General: No scleral icterus.    Extraocular Movements: Extraocular movements intact.     Conjunctiva/sclera: Conjunctivae normal.     Pupils: Pupils are equal, round, and reactive to light.  Neck:     Thyroid: No thyromegaly.     Vascular: No carotid bruit or JVD.  Cardiovascular:     Rate and Rhythm: Normal rate and regular rhythm.     Pulses: Normal pulses.     Heart sounds: Normal heart sounds.     No gallop.  Pulmonary:     Effort: Pulmonary effort is normal. No respiratory distress.     Breath sounds: Normal breath sounds. No wheezing.     Comments: Good air exch Chest:     Chest wall: No tenderness.  Abdominal:     General: Bowel sounds are normal. There is no distension or abdominal bruit.     Palpations: Abdomen is soft. There is no mass.     Tenderness: There is no abdominal tenderness.     Hernia: No hernia is present.  Genitourinary:    Comments: Breast and pelvic exam are done by gynecology   Musculoskeletal:  General: No tenderness. Normal range of motion.     Cervical back: Normal range of motion and neck supple. No rigidity. No muscular tenderness.     Right lower leg: No edema.     Left lower leg: No edema.     Comments: No kyphosis   Lymphadenopathy:     Cervical: No cervical adenopathy.  Skin:    General: Skin is warm and dry.     Coloration: Skin is not pale.     Findings: No erythema or rash.  Neurological:     Mental Status: She is alert. Mental status is at baseline.     Cranial Nerves: No cranial nerve deficit.     Motor: No abnormal muscle tone.     Coordination: Coordination normal.     Gait: Gait normal.     Deep Tendon Reflexes: Reflexes are normal and symmetric. Reflexes normal.  Psychiatric:        Mood and Affect: Mood normal.        Cognition and Memory: Cognition  and memory normal.           Assessment & Plan:   Problem List Items Addressed This Visit       Musculoskeletal and Integument   Urticaria    Still has occ lip swelling and/or hives  Wants to keep epi pen on hand- refilled  Has had neg allergy w/u  ? If non allergic cause  Sometimes when traveling        Other   Routine general medical examination at a health care facility - Primary    Reviewed health habits including diet and exercise and skin cancer prevention Reviewed appropriate screening tests for age  Also reviewed health mt list, fam hx and immunization status , as well as social and family history   See HPI Labs reviewed  Good self care  Declines flu shot  Declines shingrix shot  Mammogram scheduled for 09/27/22  Pap utd 11/2021 sees gyn Taking vit D for bone health Colonoscopy utd 10/2017  Discussed goals for diet for lipids

## 2022-09-27 ENCOUNTER — Ambulatory Visit
Admission: RE | Admit: 2022-09-27 | Discharge: 2022-09-27 | Disposition: A | Discharge status: Home / Self Care | Payer: BC Managed Care – PPO | Source: Ambulatory Visit | Attending: Family Medicine | Admitting: Family Medicine

## 2022-09-27 DIAGNOSIS — Z1231 Encounter for screening mammogram for malignant neoplasm of breast: Secondary | ICD-10-CM

## 2022-10-14 ENCOUNTER — Telehealth: Payer: Self-pay | Admitting: Family Medicine

## 2022-10-14 NOTE — Telephone Encounter (Signed)
Patient was seen in 8/23 for a sinus infection and prescribed  antibiotic augmentin, she called in stating that she has another one that she can feel the pain in her teeth,she would like to know if that same rx can be called in for her or would she have to come in to be seen again?

## 2022-10-14 NOTE — Telephone Encounter (Signed)
Pt would need a new appt before any abx will be sent in please schedule appt thanks

## 2022-10-14 NOTE — Telephone Encounter (Signed)
Patient scheduled for Wednesday

## 2022-10-16 ENCOUNTER — Encounter: Payer: Self-pay | Admitting: Family Medicine

## 2022-10-16 ENCOUNTER — Ambulatory Visit: Payer: BC Managed Care – PPO | Admitting: Family Medicine

## 2022-10-16 DIAGNOSIS — J069 Acute upper respiratory infection, unspecified: Secondary | ICD-10-CM | POA: Insufficient documentation

## 2022-10-16 MED ORDER — AMOXICILLIN-POT CLAVULANATE 875-125 MG PO TABS: 1.0000 | ORAL_TABLET | Freq: Two times a day (BID) | ORAL | 0 refills | Status: DC

## 2022-10-16 NOTE — Patient Instructions (Addendum)
Start back on your steroid nasal spray (flonase  or nasacort or nasonex)  2 sprays in each nostril every day in the allergy season  It takes about a week to work   If you don't improve in a week and you have facial pain and tenderness and green nasal mucous then fill augmentin px and take it  Keep it posted   Continue the saline sinus rinse also   Update if not starting to improve in a week or if worsening

## 2022-10-16 NOTE — Progress Notes (Signed)
Subjective:    Patient ID: Anna [name], female    DOB: [birth year], 56 y.o.   MRN: [id number]  HPI Pt presents with sinus symptoms   Wt Readings from Last 3 Encounters:  10/16/22 144 lb 6 oz (65.5 kg)  09/23/22 146 lb 12.8 oz (66.6 kg)  08/09/22 148 lb 6.4 oz (67.3 kg)   25.37 kg/m  Thinks she has a sinus infection  Started a week ago  A little cough  Some green phlegm  Pain in upper L teeth and cheek area  Runny /stuffy nose   No headache on top  No wheeze  No sob   Grand child is congested-is exposed   No tenderness  Throat is ok  (had st twice and got better at the day went on)  Scratchy  Nasal congestion  No ear pain  No fever   No aches or chills    Antihistamine- tried claritin  Saline nasal   Patient Active Problem List   Diagnosis Date Noted   Viral URI with cough 10/16/2022   Left shoulder pain 04/23/2022   Neck strain 02/28/2021   Chronic pain of left thumb 01/22/2021   Routine general medical examination at a health care facility 08/04/2020   Breast tenderness 06/30/2018   Urticaria 06/11/2016   Angioedema 06/11/2016   Lip swelling 05/07/2016   Allergic rhinitis 03/20/2007   GERD 03/20/2007   Past Medical History:  Diagnosis Date   Allergic rhinitis    Facial numbness 01/2005   neuro workup   Genital warts    GERD (gastroesophageal reflux disease)    Hiatal hernia    EGD- gastritis, esophagitis 09/2004   History of HPV infection    Knee MCL sprain 2007   Past Surgical History:  Procedure Laterality Date   LAPAROSCOPIC ASSISTED VAGINAL HYSTERECTOMY N/A 07/27/2013   Procedure: LAPAROSCOPIC ASSISTED VAGINAL HYSTERECTOMY;  Surgeon: Anastasio Auerbach, MD for leiomyomata   Social History   Tobacco Use   Smoking status: Never   Smokeless tobacco: Never  Vaping Use   Vaping Use: Never used  Substance Use Topics   Alcohol use: No    Alcohol/week: 0.0 standard drinks of alcohol   Drug use: No   Family History  Problem  Relation Age of Onset   Lymphoma Mother    Aneurysm Father        cerebral   Breast cancer Maternal Aunt        50's   Cancer Paternal Aunt        Uterine   No Known Allergies Current Outpatient Medications on File Prior to Visit  Medication Sig Dispense Refill   CALCIUM-MAGNESIUM-ZINC PO Take 2 capsules by mouth daily.     cetirizine (ZYRTEC) 10 MG tablet Take 10 mg by mouth as needed.      Cholecalciferol (VITAMIN D3 PO) Take 2,000 Units by mouth daily.      diphenhydrAMINE (BENADRYL) 50 MG capsule Take 25 mg by mouth as needed.      EPINEPHrine 0.3 mg/0.3 mL IJ SOAJ injection Inject 0.3 mg into the muscle as needed for anaphylaxis. 1 each 3   NASAL SALINE NA Place 1 spray into the nose daily as needed.     No current facility-administered medications on file prior to visit.    Review of Systems  Constitutional:  Negative for activity change, appetite change, fatigue, fever and unexpected weight change.  HENT:  Positive for congestion, postnasal drip, rhinorrhea, sinus pressure, sinus pain, sneezing and sore  throat. Negative for ear pain, facial swelling, hearing loss, trouble swallowing and voice change.   Eyes:  Negative for pain, redness and visual disturbance.  Respiratory:  Positive for cough. Negative for shortness of breath and wheezing.   Cardiovascular:  Negative for chest pain and palpitations.  Gastrointestinal:  Negative for abdominal pain, blood in stool, constipation and diarrhea.  Endocrine: Negative for polydipsia and polyuria.  Genitourinary:  Negative for dysuria, frequency and urgency.  Musculoskeletal:  Negative for arthralgias, back pain and myalgias.  Skin:  Negative for pallor and rash.  Allergic/Immunologic: Negative for environmental allergies.  Neurological:  Negative for dizziness, syncope and headaches.  Hematological:  Negative for adenopathy. Does not bruise/bleed easily.  Psychiatric/Behavioral:  Negative for decreased concentration and dysphoric  mood. The patient is not nervous/anxious.        Objective:   Physical Exam Constitutional:      General: She is not in acute distress.    Appearance: Normal appearance. She is well-developed. She is not ill-appearing, toxic-appearing or diaphoretic.  HENT:     Head: Normocephalic and atraumatic.     Comments: Nares are injected and congested    No sinus tenderness    Right Ear: Tympanic membrane, ear canal and external ear normal.     Left Ear: Tympanic membrane, ear canal and external ear normal.     Nose: Congestion and rhinorrhea present.     Mouth/Throat:     Mouth: Mucous membranes are moist.     Pharynx: Oropharynx is clear. No oropharyngeal exudate or posterior oropharyngeal erythema.     Comments: Clear pnd  Eyes:     General:        Right eye: No discharge.        Left eye: No discharge.     Conjunctiva/sclera: Conjunctivae normal.     Pupils: Pupils are equal, round, and reactive to light.  Cardiovascular:     Rate and Rhythm: Normal rate.     Heart sounds: Normal heart sounds.  Pulmonary:     Effort: Pulmonary effort is normal. No respiratory distress.     Breath sounds: Normal breath sounds. No stridor. No wheezing, rhonchi or rales.  Chest:     Chest wall: No tenderness.  Musculoskeletal:     Cervical back: Normal range of motion and neck supple.  Lymphadenopathy:     Cervical: No cervical adenopathy.  Skin:    General: Skin is warm and dry.     Capillary Refill: Capillary refill takes less than 2 seconds.     Findings: No rash.  Neurological:     Mental Status: She is alert.     Cranial Nerves: No cranial nerve deficit.  Psychiatric:        Mood and Affect: Mood normal.           Assessment & Plan:   Problem List Items Addressed This Visit       Respiratory   Viral URI with cough    More likely uri than bacterial infection right now but she is at risk for it  inst to try steroid ns of choice daily through the allergy season If not imp in  pain /facial after a week px augmentin to fill Update if not starting to improve in a week or if worsening  Disc sympt tx and ER precautions

## 2022-10-16 NOTE — Assessment & Plan Note (Signed)
More likely uri than bacterial infection right now but she is at risk for it  inst to try steroid ns of choice daily through the allergy season If not imp in pain /facial after a week px augmentin to fill Update if not starting to improve in a week or if worsening  Disc sympt tx and ER precautions

## 2022-11-12 ENCOUNTER — Telehealth: Payer: Self-pay | Admitting: Family Medicine

## 2022-11-12 NOTE — Telephone Encounter (Signed)
I'm in the lab, will route to triage pool

## 2022-11-12 NOTE — Telephone Encounter (Signed)
I spoke with pt; pt said for 1 1/2 wks has had lower mid dull abd pain that radiates to middle of back; pt said pain in consistent and right now pain level is 2. Pt said pain seems worse when sitting. No constipation and no diarrhea and no N&V; pt has no urinary symptoms. No fever and no known injury. Pt said she already has appt with Dr Glori Bickers first of Dec and a GYN appt in Jan. Pt declined appt at [facility] UC tonight due to a previous appt. Pt said she will see how she feels tomorrow and will cb to see if can get sooner appt with Dr Glori Bickers. UC & ED precautions given and pt voiced understanding. Sending note to Dr Glori Bickers who is out of office and Dr Danise Mina who is in office. And the [employer].

## 2022-11-12 NOTE — Telephone Encounter (Signed)
Dr Darnell Level said pt should be seen by 11/13/22. Pt will call [facility] at 8 AM to see if any cancellations and if not will schedule appt at [facility] UC for pt.Dr Darnell Level recommended clear liquid diet and rest the bowel. UC & ED precautions reviewed for this evening again and Pt voiced understanding and pt will call in AM.

## 2022-11-12 NOTE — Telephone Encounter (Signed)
Pt called requesting to schedule ov with [facility] for lower back & stomach pain. [facility] ov for 11/25/22. I asked pt did she want to speak to a triage nurse, she stated no but she'd want [facility] to give her a call back. Call back # [phone]

## 2022-11-13 ENCOUNTER — Encounter (HOSPITAL_COMMUNITY): Payer: Self-pay

## 2022-11-13 ENCOUNTER — Telehealth: Payer: Self-pay | Admitting: Family Medicine

## 2022-11-13 ENCOUNTER — Ambulatory Visit (HOSPITAL_COMMUNITY)
Admission: RE | Admit: 2022-11-13 | Discharge: 2022-11-13 | Disposition: A | Discharge status: Home / Self Care | Payer: BC Managed Care – PPO | Source: Ambulatory Visit | Attending: Internal Medicine | Admitting: Internal Medicine

## 2022-11-13 VITALS — BP 118/76 | HR 74 | Temp 98.3°F | Resp 12

## 2022-11-13 DIAGNOSIS — M545 Low back pain, unspecified: Secondary | ICD-10-CM | POA: Diagnosis not present

## 2022-11-13 DIAGNOSIS — R109 Unspecified abdominal pain: Secondary | ICD-10-CM | POA: Insufficient documentation

## 2022-11-13 LAB — CBC
HCT: 43.7 % (ref 36.0–46.0)
Hemoglobin: 14.4 g/dL (ref 12.0–15.0)
MCH: 28.6 pg (ref 26.0–34.0)
MCHC: 33 g/dL (ref 30.0–36.0)
MCV: 86.7 fL (ref 80.0–100.0)
Platelets: 363 10*3/uL (ref 150–400)
RBC: 5.04 MIL/uL (ref 3.87–5.11)
RDW: 13 % (ref 11.5–15.5)
WBC: 5 10*3/uL (ref 4.0–10.5)
nRBC: 0 % (ref 0.0–0.2)

## 2022-11-13 LAB — COMPREHENSIVE METABOLIC PANEL
ALT: 15 U/L (ref 0–44)
AST: 13 U/L — ABNORMAL LOW (ref 15–41)
Albumin: 4.1 g/dL (ref 3.5–5.0)
Alkaline Phosphatase: 54 U/L (ref 38–126)
Anion gap: 9 (ref 5–15)
BUN: 11 mg/dL (ref 6–20)
CO2: 24 mmol/L (ref 22–32)
Calcium: 9.4 mg/dL (ref 8.9–10.3)
Chloride: 105 mmol/L (ref 98–111)
Creatinine, Ser: 0.78 mg/dL (ref 0.44–1.00)
GFR, Estimated: >60 mL/min (ref >60)
Glucose, Bld: 89 mg/dL (ref 70–99)
Potassium: 4 mmol/L (ref 3.5–5.1)
Sodium: 138 mmol/L (ref 135–145)
Total Bilirubin: 0.8 mg/dL (ref 0.3–1.2)
Total Protein: 7.4 g/dL (ref 6.5–8.1)

## 2022-11-13 LAB — LIPASE, BLOOD: Lipase: 33 U/L (ref 11–51)

## 2022-11-13 LAB — POCT URINALYSIS DIPSTICK, ED / UC
Glucose, UA: NEGATIVE mg/dL
Glucose, UA: NEGATIVE mg/dL
Hgb urine dipstick: NEGATIVE
Hgb urine dipstick: NEGATIVE
Ketones, ur: 15 mg/dL — AB
Ketones, ur: 15 mg/dL — AB
Leukocytes,Ua: NEGATIVE
Leukocytes,Ua: NEGATIVE
Nitrite: NEGATIVE
Nitrite: NEGATIVE
Protein, ur: 30 mg/dL — AB
Protein, ur: NEGATIVE mg/dL
Specific Gravity, Urine: >=1.03 (ref 1.005–1.030)
Specific Gravity, Urine: >=1.03 (ref 1.005–1.030)
Urobilinogen, UA: 1 mg/dL (ref 0.0–1.0)
Urobilinogen, UA: 1 mg/dL (ref 0.0–1.0)
pH: 5.5 (ref 5.0–8.0)
pH: 6 (ref 5.0–8.0)

## 2022-11-13 MED ORDER — LIDOCAINE VISCOUS HCL 2 % MT SOLN
15.0000 mL | Freq: Once | OROMUCOSAL | Status: AC
  Administered 2022-11-13: 15 mL via OROMUCOSAL

## 2022-11-13 MED ORDER — ALUM & MAG HYDROXIDE-SIMETH 200-200-20 MG/5ML PO SUSP
30.0000 mL | Freq: Once | ORAL | Status: AC
  Administered 2022-11-13: 30 mL via ORAL

## 2022-11-13 MED ORDER — ALUM & MAG HYDROXIDE-SIMETH 200-200-20 MG/5ML PO SUSP
ORAL | Status: AC
  Filled 2022-11-13: qty 30

## 2022-11-13 MED ORDER — LIDOCAINE VISCOUS HCL 2 % MT SOLN
OROMUCOSAL | Status: AC
  Filled 2022-11-13: qty 15

## 2022-11-13 NOTE — Discharge Instructions (Addendum)
We will call you if any of your test results warrant a change in your plan of care.  You may view these test results on MyChart.   As discussed, please ensure adequate fluid intake, drinking at least 8 cups of water daily.   I have attached some exercises in the back of your paperwork to help with lower back pain.  As discussed, please keep your scheduled appointment with your PCP for follow-up.

## 2022-11-13 NOTE — ED Notes (Signed)
Patient unable to tolerate the Maalox and lidocaine oral solution. Patient took one sip of the medications combined and states she was unable to continue.

## 2022-11-13 NOTE — Telephone Encounter (Signed)
Pt called back and no available appts at [facility] and pt has been on liquid diet. Pt said still some discomfort when sitting in lower abd that radiates to back. Pt is scheduled at [facility] 11/13/22 at [place] with UC & ED precautions and pt voiced understanding. Sending note to Dr Glori Bickers who is out of office and Dr Darnell Level.

## 2022-11-13 NOTE — ED Triage Notes (Signed)
Pt is here for lower back pain x1wk

## 2022-11-13 NOTE — Telephone Encounter (Signed)
I am currently out of town Aware, will watch for correspondence  Agree with that advice and UC visit

## 2022-11-13 NOTE — ED Provider Notes (Signed)
[place]    CSN: [id number] Arrival date & time: 11/13/22  1520      History   Chief Complaint Chief Complaint  Patient presents with   Back Pain    lower back pain and stomach cramps on and off - Entered by patient    HPI Anna [name] is a 56 y.o. female. Patient presents c/o mid lower ABD pain and RT sided lower back pain that started 1 week ago. Patient states pain starts within the ABD and "wraps around her RT side into her back". Patient denies any fall or trauma to back. Patient reports Epigastric pain at times. Patient report back pain is improved with ambulation and walking. Patient states back pain is worsened with sitting. Patient denies any radiation of back pain down leg. Patient denies any urinary symptoms. Patient hasn't taken any medications for symptoms. Patient reports a history of GERD. Patient denies history of back pain or back injury. Patient states she's still has been able to work out despite the pain, she states pain doesn't bother her as much during her workouts.  Patient denies taking any medications for symptoms.  Patient states she prefers not to take any medications.    Back Pain Associated symptoms: abdominal pain   Associated symptoms: no dysuria, no fever, no numbness (Deneis numbness in extremities) and no pelvic pain     Past Medical History:  Diagnosis Date   Allergic rhinitis    Facial numbness 01/2005   neuro workup   Genital warts    GERD (gastroesophageal reflux disease)    Hiatal hernia    EGD- gastritis, esophagitis 09/2004   History of HPV infection    Knee MCL sprain 2007    Patient Active Problem List   Diagnosis Date Noted   Viral URI with cough 10/16/2022   Left shoulder pain 04/23/2022   Neck strain 02/28/2021   Chronic pain of left thumb 01/22/2021   Routine general medical examination at a health care facility 08/04/2020   Breast tenderness 06/30/2018   Urticaria 06/11/2016   Angioedema  06/11/2016   Lip swelling 05/07/2016   Allergic rhinitis 03/20/2007   GERD 03/20/2007    Past Surgical History:  Procedure Laterality Date   LAPAROSCOPIC ASSISTED VAGINAL HYSTERECTOMY N/A 07/27/2013   Procedure: LAPAROSCOPIC ASSISTED VAGINAL HYSTERECTOMY;  Surgeon: Anastasio Auerbach, MD for leiomyomata    OB History     Gravida  4   Para  4   Term  4   Preterm      AB      Living  4      SAB      IAB      Ectopic      Multiple      Live Births               Home Medications    Prior to Admission medications   Medication Sig Start Date End Date Taking? Authorizing Provider  amoxicillin-clavulanate (AUGMENTIN) 875-125 MG tablet Take 1 tablet by mouth 2 (two) times daily. 10/16/22   Tower, Wynelle Fanny, MD  CALCIUM-MAGNESIUM-ZINC PO Take 2 capsules by mouth daily.    [provider]  cetirizine (ZYRTEC) 10 MG tablet Take 10 mg by mouth as needed.     [provider]  Cholecalciferol (VITAMIN D3 PO) Take 2,000 Units by mouth daily.     [provider]  diphenhydrAMINE (BENADRYL) 50 MG capsule Take 25 mg by mouth as needed.  [provider]  EPINEPHrine 0.3 mg/0.3 mL IJ SOAJ injection Inject 0.3 mg into the muscle as needed for anaphylaxis. 09/23/22   Tower, Wynelle Fanny, MD  NASAL SALINE NA Place 1 spray into the nose daily as needed.    [provider]    Family History Family History  Problem Relation Age of Onset   Lymphoma Mother    Aneurysm Father        cerebral   Breast cancer Maternal Aunt        49's   Cancer Paternal Aunt        Uterine    Social History Social History   Tobacco Use   Smoking status: Never   Smokeless tobacco: Never  Vaping Use   Vaping Use: Never used  Substance Use Topics   Alcohol use: No    Alcohol/week: 0.0 standard drinks of alcohol   Drug use: No     Allergies   Patient has no known allergies.   Review of Systems Review of Systems  Constitutional:  Positive for  appetite change (Decreased appetite). Negative for activity change, chills, fatigue and fever.  Respiratory:  Negative for cough.   Gastrointestinal:  Positive for abdominal pain. Negative for abdominal distention, blood in stool, constipation, diarrhea, nausea, rectal pain and vomiting.  Genitourinary:  Negative for decreased urine volume, difficulty urinating, dyspareunia, dysuria, enuresis, flank pain, frequency, genital sores, hematuria, menstrual problem, pelvic pain, urgency, vaginal bleeding, vaginal discharge and vaginal pain.  Musculoskeletal:  Positive for back pain (RT lower back).  Neurological:  Negative for numbness (Deneis numbness in extremities).     Physical Exam Triage Vital Signs ED Triage Vitals  Enc Vitals Group     BP 11/13/22 1629 118/76     Pulse Rate 11/13/22 1629 74     Resp 11/13/22 1629 12     Temp 11/13/22 1629 98.3 F (36.8 C)     Temp Source 11/13/22 1629 Oral     SpO2 11/13/22 1629 98 %     Weight --      Height --      Head Circumference --      Peak Flow --      Pain Score 11/13/22 1627 2     Pain Loc --      Pain Edu? --      Excl. in [place]? --    No data found.  Updated Vital Signs BP 118/76 (BP Location: Left Arm)   Pulse 74   Temp 98.3 F (36.8 C) (Oral)   Resp 12   LMP 07/11/2013   SpO2 98%   Physical Exam Vitals and nursing note reviewed.  Constitutional:      Appearance: Normal appearance.  Cardiovascular:     Rate and Rhythm: Normal rate and regular rhythm.     Heart sounds: Normal heart sounds, S1 normal and S2 normal.  Pulmonary:     Effort: Pulmonary effort is normal.     Breath sounds: Normal breath sounds and air entry. No decreased breath sounds, wheezing, rhonchi or rales.  Abdominal:     General: Abdomen is flat. Bowel sounds are normal. There is no distension. There are no signs of injury.     Palpations: Abdomen is soft. There is no shifting dullness, fluid wave, hepatomegaly, splenomegaly, mass or pulsatile mass.      Tenderness: There is abdominal tenderness in the epigastric area. There is no right CVA tenderness, left CVA tenderness, guarding or rebound. Negative signs include Murphy's  sign and McBurney's sign.     Comments: Patient reported some nausea during ABD exam that resolved quickly   Neurological:     Mental Status: She is alert.      UC Treatments / Results  Labs (all labs ordered are listed, but only abnormal results are displayed) Labs Reviewed  POCT URINALYSIS DIPSTICK, ED / UC - Abnormal; Notable for the following components:      Result Value   Bilirubin Urine SMALL (*)    Ketones, ur 15 (*)    All other components within normal limits  POCT URINALYSIS DIPSTICK, ED / UC - Abnormal; Notable for the following components:   Bilirubin Urine SMALL (*)    Ketones, ur 15 (*)    Protein, ur 30 (*)    All other components within normal limits  URINE CULTURE  CBC  COMPREHENSIVE METABOLIC PANEL  LIPASE, BLOOD    EKG   Radiology No results found.  Procedures Procedures (including critical care time)  Medications Ordered in UC Medications  lidocaine (XYLOCAINE) 2 % viscous mouth solution 15 mL (15 mLs Mouth/Throat Given 11/13/22 1703)  alum & mag hydroxide-simeth (MAALOX/MYLANTA) 200-200-20 MG/5ML suspension 30 mL (30 mLs Oral Given 11/13/22 1703)    Initial Impression / Assessment and Plan / UC Course  I have reviewed the triage vital signs and the nursing notes.  Pertinent labs & imaging results that were available during my care of the patient were reviewed by me and considered in my medical decision making (see chart for details).     Patient was evaluated for right lower back pain and abdominal pain (suprapubic and epigastric) . Urinalysis showed bilirubin small, ketones 15, and protein 30. No bacterial etiology seen on urinalysis or hematuria that could point in the direction of a kidney stones. Uncertain etiology of symptoms, epigastric symptoms could be related  to patient's history of acid reflux and hiatal hernia.  Lower mid abdominal pain and lower back pain is uncertain etiology.  Back pain etiology could be muscle skeletal, patient reports increased pain with sitting.  Patient was given exercises to help aid with this and was encouraged to continue using methods that helped with the pain.  Patient declined any use of NSAIDs or a muscle relaxant at this time.  Urine culture is pending to verify no bacterial etiology.  CMP, CBC, and lipase was ordered to verify pancreatic function, kidney function, liver function, and white blood cell count. Patient was offered a PPI for discharge, patient declined. Patient stated the GI cocktail in office helped her symptoms but she wasn't fond of the numb sensation from the lidocaine.  Patient was made aware of red flag symptoms that would warrant an emergency department visit.  Patient was encouraged to keep her appointment with her PCP for December 4.  Patient verbalized understanding of instructions.  Charting was provided using a a verbal dictation system, charting was proofread for errors, errors may occur which could change the meaning of the information charted.   Final Clinical Impressions(s) / UC Diagnoses   Final diagnoses:  Acute right-sided low back pain without sciatica  Abdominal pain, unspecified abdominal location     Discharge Instructions      We will call you if any of your test results warrant a change in your plan of care.  You may view these test results on MyChart.   As discussed, please ensure adequate fluid intake, drinking at least 8 cups of water daily.   I have attached some  exercises in the back of your paperwork to help with lower back pain.  As discussed, please keep your scheduled appointment with your PCP for follow-up.     ED Prescriptions   None    PDMP not reviewed this encounter.   Flossie Dibble, NP 11/13/22 1821

## 2022-11-14 LAB — URINE CULTURE
Culture: <10000 — AB
Special Requests: NORMAL

## 2022-11-22 ENCOUNTER — Telehealth: Payer: Self-pay | Admitting: Family Medicine

## 2022-11-22 NOTE — Telephone Encounter (Signed)
Please review labs. 

## 2022-11-22 NOTE — Telephone Encounter (Signed)
Patient would like a phone call to discuss results she received from when she was seen at urgent care on 11/13/22. She seen her results through mychart,and would like to discuss them.

## 2022-11-25 ENCOUNTER — Ambulatory Visit: Payer: BC Managed Care – PPO | Admitting: Family Medicine

## 2022-11-25 NOTE — Telephone Encounter (Signed)
Her labs looked good for the most part and her urine culture did not indicate infection.  Her urine had some protein in it and some ketones (always possible she needed some more fluids at that time) We can re check a ua the next time she is here   If symptoms are not improved please follow up for a visit (the note indicated she would be following up)

## 2022-11-25 NOTE — Telephone Encounter (Signed)
Pt notified of lab results and Dr. Marliss Coots comments. F/u appt scheduled with pt on 11/28/22, pt advised if sxs worsen in the mean time to go back to UC or ER or call to see if another provider has an appt sooner, pt verbalized understanding.

## 2022-11-28 ENCOUNTER — Encounter: Payer: Self-pay | Admitting: Family Medicine

## 2022-11-28 ENCOUNTER — Ambulatory Visit: Payer: BC Managed Care – PPO | Admitting: Family Medicine

## 2022-11-28 VITALS — BP 130/74 | HR 75 | Temp 97.4°F | Ht 63.25 in | Wt 144.1 lb

## 2022-11-28 DIAGNOSIS — G8929 Other chronic pain: Secondary | ICD-10-CM

## 2022-11-28 DIAGNOSIS — R1013 Epigastric pain: Secondary | ICD-10-CM | POA: Diagnosis not present

## 2022-11-28 DIAGNOSIS — K219 Gastro-esophageal reflux disease without esophagitis: Secondary | ICD-10-CM

## 2022-11-28 DIAGNOSIS — M545 Low back pain, unspecified: Secondary | ICD-10-CM | POA: Diagnosis not present

## 2022-11-28 DIAGNOSIS — R102 Pelvic and perineal pain: Secondary | ICD-10-CM | POA: Diagnosis not present

## 2022-11-28 NOTE — Progress Notes (Signed)
Subjective:    Patient ID: Anna [name], female    DOB: 09-Apr-1966, 56 y.o.   MRN: [id number]  HPI Pt presents for f/u from UC visit for back pain and abd pain   Wt Readings from Last 3 Encounters:  11/28/22 144 lb 2 oz (65.4 kg)  10/16/22 144 lb 6 oz (65.5 kg)  09/23/22 146 lb 12.8 oz (66.6 kg)   25.33 kg/m   She was seen on 11/22 for mid lower abd and right sided lower back pain for a week  Noted pain wrapped around R side into her back (actually both sides)  Occ epig pain  No trauma or radicular symptoms   US showed bili and ketones and protein  Disc poss of MSK cause  Give exercises to help back  Urine cx retutned neg for infx   Was offered ppi for upper abd pain and she declined  Given GI cocktail that helped but she dislikes it   Lab Results  Component Value Date   CREATININE 0.78 11/13/2022   BUN 11 11/13/2022   NA 138 11/13/2022   K 4.0 11/13/2022   CL 105 11/13/2022   CO2 24 11/13/2022   Lab Results  Component Value Date   ALT 15 11/13/2022   AST 13 (L) 11/13/2022   ALKPHOS 54 11/13/2022   BILITOT 0.8 11/13/2022   Lab Results  Component Value Date   WBC 5.0 11/13/2022   HGB 14.4 11/13/2022   HCT 43.7 11/13/2022   MCV 86.7 11/13/2022   PLT 363 11/13/2022   Lab Results  Component Value Date   LIPASE 33 11/13/2022    Last LS films from 2019   IMPRESSION: 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 No disc space narrowing. No fracture or spondylolisthesis.   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  Is able to change bed - both head and feet are elevate   Patient Active Problem List   Diagnosis Date Noted   Pelvic pressure in female 11/28/2022   Low back pain 11/28/2022   Epigastric pain 11/28/2022   Left shoulder pain 04/23/2022   Chronic pain of left thumb 01/22/2021   Routine general medical examination at a health care facility 08/04/2020   Breast tenderness 06/30/2018   Angioedema 06/11/2016   Allergic rhinitis 03/20/2007   GERD 03/20/2007   Past Medical History:  Diagnosis Date   Allergic rhinitis    Facial numbness 01/2005   neuro workup   Genital warts    GERD (gastroesophageal reflux disease)    Hiatal hernia    EGD- gastritis, esophagitis 09/2004   History of HPV infection    Knee MCL sprain 2007   Past Surgical History:  Procedure Laterality Date   LAPAROSCOPIC ASSISTED VAGINAL HYSTERECTOMY N/A 07/27/2013   Procedure: LAPAROSCOPIC ASSISTED VAGINAL HYSTERECTOMY;  Surgeon: Anastasio Auerbach, MD  for leiomyomata   Social History   Tobacco Use   Smoking status: Never   Smokeless tobacco: Never  Vaping Use   Vaping Use: Never used  Substance Use Topics   Alcohol use: No    Alcohol/week: 0.0 standard drinks of alcohol   Drug use: No   Family History  Problem Relation Age of Onset   Lymphoma Mother    Aneurysm Father        cerebral   Breast cancer Maternal Aunt        50's   Cancer Paternal Aunt        Uterine   No Known Allergies Current Outpatient Medications on File Prior to Visit  Medication Sig Dispense Refill   CALCIUM-MAGNESIUM-ZINC PO Take 2 capsules by mouth daily.     cetirizine (ZYRTEC) 10 MG tablet Take 10 mg by mouth as needed.      Cholecalciferol (VITAMIN D3 PO) Take 2,000 Units by mouth daily.      diphenhydrAMINE (BENADRYL) 50 MG capsule Take 25 mg by mouth as needed.      EPINEPHrine 0.3 mg/0.3 mL IJ SOAJ injection Inject 0.3 mg into the muscle as needed for anaphylaxis. 1 each 3    NASAL SALINE NA Place 1 spray into the nose daily as needed.     No current facility-administered medications on file prior to visit.     Review of Systems  Constitutional:  Negative for activity change, appetite change, fatigue, fever and unexpected weight change.  HENT:  Negative for congestion, ear pain, rhinorrhea, sinus pressure and sore throat.   Eyes:  Negative for pain, redness and visual disturbance.  Respiratory:  Negative for cough, shortness of breath and wheezing.   Cardiovascular:  Negative for chest pain and palpitations.  Gastrointestinal:  Positive for abdominal pain. Negative for abdominal distention, anal bleeding, blood in stool, constipation, diarrhea, nausea, rectal pain and vomiting.  Endocrine: Negative for polydipsia and polyuria.  Genitourinary:  Negative for dysuria, frequency and urgency.       Pelvic pressure /not pain   Musculoskeletal:  Positive for back pain. Negative for arthralgias and myalgias.  Skin:  Negative for pallor and rash.  Allergic/Immunologic: Negative for environmental allergies.  Neurological:  Negative for dizziness, syncope and headaches.  Hematological:  Negative for adenopathy. Does not bruise/bleed easily.  Psychiatric/Behavioral:  Negative for decreased concentration and dysphoric mood. The patient is not nervous/anxious.        Objective:   Physical Exam Constitutional:      General: She is not in acute distress.    Appearance: Normal appearance. She is well-developed. She is not ill-appearing or diaphoretic.  HENT:     Head: Normocephalic and atraumatic.     Mouth/Throat:     Mouth: Mucous membranes are moist.  Eyes:     Conjunctiva/sclera: Conjunctivae normal.     Pupils: Pupils are equal, round, and reactive to light.  Neck:     Thyroid: No thyromegaly.     Vascular: No carotid bruit or JVD.  Cardiovascular:     Rate and Rhythm: Normal rate and regular rhythm.     Heart sounds: Normal heart sounds.     No gallop.   Pulmonary:     Effort: Pulmonary effort is normal. No respiratory distress.     Breath sounds: Normal breath sounds. No wheezing or rales.  Abdominal:     General: There is no distension or abdominal bruit.     Palpations: Abdomen is soft. There is no  mass.     Tenderness: There is no abdominal tenderness. There is no right CVA tenderness, left CVA tenderness, guarding or rebound.     Hernia: No hernia is present.     Comments: No suprapubic tenderness or fullness    Musculoskeletal:     Cervical back: Normal range of motion and neck supple.     Right lower leg: No edema.     Left lower leg: No edema.     Comments: No LS tenderness Neg SLR Some loss of lordosis   Lymphadenopathy:     Cervical: No cervical adenopathy.  Skin:    General: Skin is warm and dry.     Coloration: Skin is not jaundiced or pale.     Findings: No bruising, erythema, lesion or rash.  Neurological:     Mental Status: She is alert.     Motor: No weakness.     Coordination: Coordination normal.     Deep Tendon Reflexes: Reflexes are normal and symmetric. Reflexes normal.  Psychiatric:        Mood and Affect: Mood normal.           Assessment & Plan:   Problem List Items Addressed This Visit       Digestive   GERD    Unsure if this is part of her epig pain / intermittent No meds currently         Other   Epigastric pain    Per pt this is on and off  Rev UC from November  Suspect this may be acid related  It no all the time so pt declines med for now but would consider H2 blocker if it becomes persistent  Nl exam today/no tenderness Had nl [place] abd in past  Enc to watch diet and monitor       Low back pain    This [name] and goes  Unsure if related to the pelvic pain or not  Nl exam  Sitting more for work- suggest trying a lumbar support for chair Has optimized her bed  Good exercise  Rev last lumbar film  Given handout with exercises /stretches for back       Pelvic pressure in  female - Primary    This [name] and goes None today Rev UC notes and lab from visit in nov  No gyn symptoms (partial hyst in past) Nl exam  Disc ua which at the time had bili and ket and prot (neg culture)  Need to re check for protein  Disc poss bladder discomfort with concentrated urine  Plans to come back soon to repeat ua when well hydrated Enc good hydration -see AVS  If not imp may need gyn w/u (pt has her gyn appt with Dr Scherrie Bateman in [place])

## 2022-11-28 NOTE — Patient Instructions (Addendum)
Keep your back stretched out   Stay hydrated  Aim for 64 oz of fluid daily (mostly water)  If your low abdominal/pelvic pain does not improve with better hydration let us know  See your gyn in January and mention these symptoms if they are still happening Also watch for vaginal symptoms  I want to re check urinalysis when you are better hydrated  Try the back exercises in the handout   If the upper abdominal pain persists we may need to try an acid lowering medicine like pepcid  Let me know

## 2022-11-28 NOTE — Assessment & Plan Note (Signed)
Unsure if this is part of her epig pain / intermittent No meds currently

## 2022-11-28 NOTE — Assessment & Plan Note (Signed)
Per pt this is on and off  Rev UC from November  Suspect this may be acid related  It no all the time so pt declines med for now but would consider H2 blocker if it becomes persistent  Nl exam today/no tenderness Had nl [place] abd in past  Enc to watch diet and monitor

## 2022-11-28 NOTE — Assessment & Plan Note (Signed)
This comes and goes None today Rev UC notes and lab from visit in nov  No gyn symptoms (partial hyst in past) Nl exam  Disc ua which at the time had bili and ket and prot (neg culture)  Need to re check for protein  Disc poss bladder discomfort with concentrated urine  Plans to come back soon to repeat ua when well hydrated Enc good hydration -see AVS  If not imp may need gyn w/u (pt has her gyn appt with Dr Scherrie Bateman in [place])

## 2022-11-28 NOTE — Assessment & Plan Note (Signed)
This comes and goes  Unsure if related to the pelvic pain or not  Nl exam  Sitting more for work- suggest trying a lumbar support for chair Has optimized her bed  Good exercise  Rev last lumbar film  Given handout with exercises /stretches for back

## 2022-11-29 NOTE — Telephone Encounter (Signed)
error 

## 2022-12-04 ENCOUNTER — Ambulatory Visit: Payer: BC Managed Care – PPO

## 2022-12-04 ENCOUNTER — Other Ambulatory Visit (INDEPENDENT_AMBULATORY_CARE_PROVIDER_SITE_OTHER): Payer: BC Managed Care – PPO

## 2022-12-04 DIAGNOSIS — R102 Pelvic and perineal pain: Secondary | ICD-10-CM | POA: Diagnosis not present

## 2022-12-04 DIAGNOSIS — R8290 Unspecified abnormal findings in urine: Secondary | ICD-10-CM | POA: Diagnosis not present

## 2022-12-04 LAB — POC URINALSYSI DIPSTICK (AUTOMATED)
Bilirubin, UA: NEGATIVE
Blood, UA: NEGATIVE
Glucose, UA: NEGATIVE
Ketones, UA: NEGATIVE
Nitrite, UA: NEGATIVE
Protein, UA: NEGATIVE
Spec Grav, UA: 1.02 (ref 1.010–1.025)
Urobilinogen, UA: 0.2 E.U./dL
pH, UA: 6 (ref 5.0–8.0)

## 2022-12-08 MED ORDER — AMOXICILLIN 500 MG PO CAPS: 500.0000 mg | ORAL_CAPSULE | Freq: Three times a day (TID) | ORAL | 0 refills | Status: DC

## 2022-12-08 NOTE — Addendum Note (Signed)
Addended by: Loura Pardon A on: 12/08/2022 10:54 AM   Modules accepted: Orders

## 2022-12-08 NOTE — Assessment & Plan Note (Signed)
Urine grew GB strep Amox sent to pharmacy

## 2022-12-09 LAB — URINE CULTURE
MICRO NUMBER:: 14310039
SPECIMEN QUALITY:: ADEQUATE

## 2022-12-24 ENCOUNTER — Telehealth: Payer: Self-pay

## 2022-12-24 ENCOUNTER — Encounter: Payer: Self-pay | Admitting: Family Medicine

## 2022-12-24 ENCOUNTER — Ambulatory Visit: Payer: BC Managed Care – PPO | Admitting: Family Medicine

## 2022-12-24 VITALS — BP 108/60 | HR 70 | Temp 97.4°F | Ht 63.25 in | Wt 145.0 lb

## 2022-12-24 DIAGNOSIS — M25512 Pain in left shoulder: Secondary | ICD-10-CM

## 2022-12-24 DIAGNOSIS — R222 Localized swelling, mass and lump, trunk: Secondary | ICD-10-CM | POA: Diagnosis not present

## 2022-12-24 DIAGNOSIS — G8929 Other chronic pain: Secondary | ICD-10-CM

## 2022-12-24 NOTE — Telephone Encounter (Addendum)
Unable to reach pt,pts husband or pts mother; I was able to speak with pt; pt did not go to UC or ED due to wait time. Today there is still bruised area on lt breast that bruise goes up to lt arm. Lt arm has bothered pt for a long time. Pt said also knot lt breast near the top of breast in the middle of lt breast. Pt has not had any known injury and is not on a blood thinner.No CP and no SOB.pt does not want to go to UC or ED. Pt scheduled appt with Dr Darnell Level 12/24/22 at 10:30 with UC & ED precautions and pt voiced understanding. Sending note to Dr Darnell Level and G pool.

## 2022-12-24 NOTE — Patient Instructions (Signed)
For left shoulder possible rotator cuff injury - schedule appointment with Dr Lorelei Pont sports medicine.  For left chest wall bruising - unclear cause, would treat as bruise injury with heating pad, gentle stretching, and if not resolved over next 1 week or worsening, let me know for chest wall ultrasound.  Good to see you today.

## 2022-12-24 NOTE — Progress Notes (Signed)
Patient ID: Anna [name], female    DOB: [birth year], 57 y.o.   MRN: [id number]  This visit was conducted in person.  BP 108/60 (BP Location: Left Arm, Patient Position: Sitting)   Pulse 70   Temp (!) 97.4 F (36.3 C) (Skin)   Ht 5' 3.25" (1.607 m)   Wt 145 lb (65.8 kg)   LMP 07/11/2013   SpO2 99%   BMI 25.48 kg/m    CC: knot on chest Subjective:   HPI: Anna [name] is a 57 y.o. female presenting on 12/24/2022 for Cyst (Knot in chest, noticed Saturday)   Woke up 12/21/2022, bending over and felt knot to left upper chest associated with bruising. Tender to touch. Denies inciting trauma/injury or fall.   No other knots on body. No rash or skin changes.  No fevers/chills.  No easy bleeding.  She does bruise easily but that is better since changing lotion brands.   She's also had L shoulder/arm discomfort present for the past 6 months. She had reassuring xray (L shoulder and cervical spine). Did not do PT. Arm didn't bother her on trip to [place] (06/2022). After return she restarted working out and arm started bothering her again. No cervical radiculopathy, numbness, tingling or weakness of arm.   Occasional L neck pain. No unilateral swelling. Cervical xrays showed degenerative changes with L neural foraminal narrowing.  Regularly sees gym/personal trainer   H/o urticaria and angioedema while in [place], now uses zyrtec and benadryl and epi pen PRN. Has seen allergist for this.   Recent mammogram 09/2022 [facility]     Relevant past medical, surgical, family and social history reviewed and updated as indicated. Interim medical history since our last visit reviewed. Allergies and medications reviewed and updated. Outpatient Medications Prior to Visit  Medication Sig Dispense Refill   CALCIUM-MAGNESIUM-ZINC PO Take 2 capsules by mouth daily.     cetirizine (ZYRTEC) 10 MG tablet Take 10 mg by mouth as needed.      Cholecalciferol (VITAMIN D3 PO) Take 2,000  Units by mouth daily.      diphenhydrAMINE (BENADRYL) 50 MG capsule Take 25 mg by mouth as needed.      EPINEPHrine 0.3 mg/0.3 mL IJ SOAJ injection Inject 0.3 mg into the muscle as needed for anaphylaxis. 1 each 3   NASAL SALINE NA Place 1 spray into the nose daily as needed.     vitamin E 180 MG (400 UNITS) capsule Take 400 Units by mouth daily.     amoxicillin (AMOXIL) 500 MG capsule Take 1 capsule (500 mg total) by mouth 3 (three) times daily. (Patient not taking: Reported on 12/24/2022) 21 capsule 0   No facility-administered medications prior to visit.     Per HPI unless specifically indicated in ROS section below Review of Systems  Objective:  BP 108/60 (BP Location: Left Arm, Patient Position: Sitting)   Pulse 70   Temp (!) 97.4 F (36.3 C) (Skin)   Ht 5' 3.25" (1.607 m)   Wt 145 lb (65.8 kg)   LMP 07/11/2013   SpO2 99%   BMI 25.48 kg/m   Wt Readings from Last 3 Encounters:  12/24/22 145 lb (65.8 kg)  11/28/22 144 lb 2 oz (65.4 kg)  10/16/22 144 lb 6 oz (65.5 kg)      Physical Exam Vitals and nursing note reviewed.  Constitutional:      Appearance: Normal appearance. She is not ill-appearing.  Cardiovascular:     Rate and Rhythm: Normal  rate and regular rhythm.     Pulses: Normal pulses.     Heart sounds: Normal heart sounds. No murmur heard. Pulmonary:     Effort: Pulmonary effort is normal. No respiratory distress.     Breath sounds: Normal breath sounds. No wheezing, rhonchi or rales.  Chest:       Comments: Mild bruising and soft tissue swelling to left upper chest above breast, non-tender, separate from sternum and costochondral junction.  Musculoskeletal:        General: Tenderness present. No swelling.     Comments:  Left shoulder exam: No deformity of shoulders on inspection. No significant pain with palpation of shoulder landmarks. Limited ROM in abduction and forward flexion past 90 degrees to left shoulder. No pain or weakness with testing SITS in  ext/int rotation. ++ pain/weakness with empty can sign. Neg Speed test. Painful with impingement testing. No significant pain with rotation of humeral head in [place] joint.   Skin:    General: Skin is warm and dry.     Findings: Bruising present.  Neurological:     Mental Status: She is alert.  Psychiatric:        Mood and Affect: Mood normal.        Behavior: Behavior normal.       Lab Results  Component Value Date   WBC 5.0 11/13/2022   HGB 14.4 11/13/2022   HCT 43.7 11/13/2022   MCV 86.7 11/13/2022   PLT 363 11/13/2022    Assessment & Plan:   Problem List Items Addressed This Visit     Chronic left shoulder pain    Chronic L shoulder pain for 6 months now, with significant limitations on exam.  Anticipate chronic RTC injury, recommended sports medicine evaluation prior to starting PT course.  Pt will call to schedule appointment with Dr Lorelei Pont.  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       Soft tissue swelling of chest wall - Primary    Mild swelling of upper chest wall just lateral to 2nd costochondral junction with overlying bruising without significant discomfort to palpation. Most consistent with soft tissue injury however pt denies inciting trauma/injury. Will treat supportively with warm compress/heating pad for next week and if not improving as expected discussed possible soft tissue ultrasound for further evaluation. Pt agrees with plan.         No orders of the defined types were placed in this encounter.  No orders of the defined types were placed in this encounter.    Patient Instructions  For left shoulder possible rotator cuff injury - schedule appointment with Dr Lorelei Pont sports medicine.  For left chest wall bruising - unclear cause, would treat as bruise injury with heating pad, gentle stretching, and if not resolved over next 1 week or worsening, let  me know for chest wall ultrasound.  Good to see you today.  Follow up plan: Return if symptoms worsen or fail to improve.  [name], MD

## 2022-12-24 NOTE — Assessment & Plan Note (Signed)
Mild swelling of upper chest wall just lateral to 2nd costochondral junction with overlying bruising without significant discomfort to palpation. Most consistent with soft tissue injury however pt denies inciting trauma/injury. Will treat supportively with warm compress/heating pad for next week and if not improving as expected discussed possible soft tissue ultrasound for further evaluation. Pt agrees with plan.

## 2022-12-24 NOTE — Telephone Encounter (Signed)
[facility] Night - Client TELEPHONE ADVICE RECORD [facility] Patient Name: Anna [name] Gender: Female DOB: 06-May-1966 Age: 57 Y 3 M 21 D Return Phone Number: [phone] (Primary), [phone] (Secondary) Address: City/ State/ Zip: [place]  [address] Client [facility] Night - Client Client Site [place] Provider [place], Roque Lias - MD Contact Type Call Who Is Calling Patient / Member / Family / Caregiver Call Type Triage / Clinical Relationship To Patient Self Return Phone Number [phone] (Primary) Chief Complaint CHEST PAIN - pain, pressure, heaviness or tightness Reason for Call Symptomatic / Request for Health Information Initial Comment caller states last night she bent down and she touch a chest in the middle. It is bruised and if she touch it it is sore. its a knot on her chest. she also been having pain and in her arm. Translation No Nurse Assessment Nurse: Cruzita Lederer, RN, Richrd Sox Date/Time (Eastern Time): 12/22/2022 4:13:01 PM Confirm and document reason for call. If symptomatic, describe symptoms. ---Caller states that when she bent over last night she felt a pop and now she is having a blue area with a lump that is tender. She is also having some left shoulder to her elbow (this is not new she is supposed to have PT on it). This lump does not interfere with her breathing. She has also noted some bruising on her legs that come and go. Does the patient have any new or worsening symptoms? ---Yes Will a triage be completed? ---Yes Related visit to physician within the last 2 weeks? ---No Does the PT have any chronic conditions? (i.e. diabetes, asthma, this includes High risk factors for pregnancy, etc.) ---Yes List chronic conditions. ---Left shoulder pain Is this a behavioral health or substance abuse call? ---No Guidelines Guideline Title Affirmed Question Affirmed  Notes Nurse Date/Time (Eastern Time) Bruises [1] Raised bruise AND [2] size > 2 inches (5 cm) AND [3] getting bigger Cruzita Lederer, RN, Richrd Sox 12/22/2022 4:18:32 PM PLEASE NOTE: All timestamps contained within this report are represented as [place] Standard Time. CONFIDENTIALTY NOTICE: This fax transmission is intended only for the addressee. It contains information that is legally privileged, confidential or otherwise protected from use or disclosure. If you are not the intended recipient, you are strictly prohibited from reviewing, disclosing, copying using or disseminating any of this information or taking any action in reliance on or regarding this information. If you have received this fax in error, please notify us immediately by telephone so that we can arrange for its return to [place]. Phone: [phone], Toll-Free: [phone], Fax: [phone] Page: 2 of 2 Call Id: [id number] [place]. Time [name] Time) Disposition Final User 12/22/2022 4:11:28 PM Send to Urgent Suzan Nailer 12/22/2022 4:25:00 PM See HCP within 4 Hours (or PCP triage) Yes Cruzita Lederer, RN, Richrd Sox Final Disposition 12/22/2022 4:25:00 PM See HCP within 4 Hours (or PCP triage) Yes Cruzita Lederer, RN, Albertine Patricia Disagree/Comply Comply Caller Understands Yes PreDisposition Did not know what to do Care Advice Given Per Guideline SEE HCP (OR PCP TRIAGE) WITHIN 4 HOURS: * IF OFFICE WILL BE CLOSED AND NO PCP (PRIMARY CARE PROVIDER) SECOND-LEVEL TRIAGE: You need to be seen within the next 3 or 4 hours. A nearby Urgent Care Center [facility]) is often a good source of care. Another choice is to go to the ED. Go sooner if you become worse. USE A COLD PACK FOR PAIN, SWELLING, OR BRUISING: * Put a cold pack  or an ice bag (wrapped in a moist towel) on the area for 20 minutes. * Repeat in 1 hour, then every 4 hours while awake. * Continue this for the first 48 hours (2 days) after an injury. CALL BACK IF: * You become worse CARE  ADVICE given per Bruises (Adult) guideline. Referrals  Urgent [place] at [facility]

## 2022-12-24 NOTE — Assessment & Plan Note (Addendum)
Chronic L shoulder pain for 6 months now, with significant limitations on exam.  Anticipate chronic RTC injury, recommended sports medicine evaluation prior to starting PT course.  Pt will call to schedule appointment with Dr Lorelei Pont.  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

## 2022-12-25 ENCOUNTER — Encounter: Payer: Self-pay | Admitting: Obstetrics & Gynecology

## 2022-12-25 ENCOUNTER — Telehealth: Payer: Self-pay | Admitting: Family Medicine

## 2022-12-25 ENCOUNTER — Ambulatory Visit (INDEPENDENT_AMBULATORY_CARE_PROVIDER_SITE_OTHER): Payer: BC Managed Care – PPO | Admitting: Obstetrics & Gynecology

## 2022-12-25 VITALS — BP 114/70 | HR 77 | Temp 97.9°F | Ht 63.5 in | Wt 145.0 lb

## 2022-12-25 DIAGNOSIS — Z90710 Acquired absence of both cervix and uterus: Secondary | ICD-10-CM

## 2022-12-25 DIAGNOSIS — Z01419 Encounter for gynecological examination (general) (routine) without abnormal findings: Secondary | ICD-10-CM | POA: Diagnosis not present

## 2022-12-25 DIAGNOSIS — E7800 Pure hypercholesterolemia, unspecified: Secondary | ICD-10-CM

## 2022-12-25 DIAGNOSIS — R102 Pelvic and perineal pain: Secondary | ICD-10-CM | POA: Diagnosis not present

## 2022-12-25 DIAGNOSIS — E785 Hyperlipidemia, unspecified: Secondary | ICD-10-CM | POA: Insufficient documentation

## 2022-12-25 LAB — URINALYSIS, COMPLETE W/RFL CULTURE
Bacteria, UA: NONE SEEN /HPF
Bilirubin Urine: NEGATIVE
Casts: NONE SEEN /LPF
Crystals: NONE SEEN /HPF
Glucose, UA: NEGATIVE
Hgb urine dipstick: NEGATIVE
Hyaline Cast: NONE SEEN /LPF
Ketones, ur: NEGATIVE
Leukocyte Esterase: NEGATIVE
Nitrites, Initial: NEGATIVE
Protein, ur: NEGATIVE
RBC / HPF: NONE SEEN /HPF (ref 0–2)
Specific Gravity, Urine: 1.001 (ref 1.001–1.035)
WBC, UA: NONE SEEN /HPF (ref 0–5)
Yeast: NONE SEEN /HPF
pH: 6 (ref 5.0–8.0)

## 2022-12-25 LAB — NO CULTURE INDICATED

## 2022-12-25 NOTE — Telephone Encounter (Signed)
-----   Message from Velna Hatchet, RT sent at 12/09/2022  4:13 PM EST ----- Regarding: Fri 1/5 lab Lab orders for 3 month follow up needed, please.  Thanks, Anda Kraft

## 2022-12-25 NOTE — Progress Notes (Signed)
Anna [name] [birth year] [id number]   History:    57 y.o.  G4P4L4   RP:  Established patient presenting for annual gyn exam    HPI: H/O Hysterectomy for fibroids.  No hot flashes, night sweats or vaginal dryness. Pt c/o pelvic cramping/pressure & pain in hips No pain with IC.  Pap smear Neg 11/2021. No history of significant abnormal Pap smears. Breasts normal, except for a bruise at the left upper inner breast.  Mammography Neg 09/2022.Colonoscopy 10/2017. BMI 25.28. Pt declined flu vaccine.  Past medical history,surgical history, family history and social history were all reviewed and documented in the EPIC chart.  Gynecologic History Patient's last menstrual period was 07/11/2013.  Obstetric History OB History  Gravida Para Term Preterm AB Living  '4 4 4     4  '$ SAB IAB Ectopic Multiple Live Births               # Outcome Date GA Lbr Len/2nd Weight Sex Delivery Anes PTL Lv  4 Term           3 Term           2 Term           1 Term             ROS: A ROS was performed and pertinent positives and negatives are included in the history. GENERAL: No fevers or chills. HEENT: No change in vision, no earache, sore throat or sinus congestion. NECK: No pain or stiffness. CARDIOVASCULAR: No chest pain or pressure. No palpitations. PULMONARY: No shortness of breath, cough or wheeze. GASTROINTESTINAL: No abdominal pain, nausea, vomiting or diarrhea, melena or bright red blood per rectum. GENITOURINARY: No urinary frequency, urgency, hesitancy or dysuria. MUSCULOSKELETAL: No joint or muscle pain, no back pain, no recent trauma. DERMATOLOGIC: No rash, no itching, no lesions. ENDOCRINE: No polyuria, polydipsia, no heat or cold intolerance. No recent change in weight. HEMATOLOGICAL: No anemia or easy bruising or bleeding. NEUROLOGIC: No headache, seizures, numbness, tingling or weakness. PSYCHIATRIC: No depression, no loss of interest in normal activity or change in sleep pattern.      Exam:   BP 114/70   Pulse 77   Ht 5' 3.5" (1.613 m)   Wt 145 lb (65.8 kg)   LMP 07/11/2013   SpO2 99%   BMI 25.28 kg/m   Body mass index is 25.28 kg/m.  General appearance : Well developed well nourished female. No acute distress HEENT: Eyes: no retinal hemorrhage or exudates,  Neck supple, trachea midline, no carotid bruits, no thyroidmegaly Lungs: Clear to auscultation, no rhonchi or wheezes, or rib retractions  Heart: Regular rate and rhythm, no murmurs or gallops Breast:Examined in sitting and supine position were symmetrical in appearance, no palpable masses.  Some tenderness at the bruise on the upper inner left breast about 4 x 4 cm,  no skin retraction, no nipple inversion, no nipple discharge, no skin discoloration, no axillary or supraclavicular lymphadenopathy Abdomen: no palpable masses or tenderness, no rebound or guarding Extremities: no edema or skin discoloration or tenderness  Pelvic: Vulva: Normal             Vagina: No gross lesions or discharge  Cervix/Uterus absent  Adnexa  Without masses or tenderness  Anus: Normal  U/A completely Negative   Assessment/Plan:  57 y.o. female for annual exam   1. Well female exam with routine gynecological exam H/O Hysterectomy for fibroids.  No hot flashes, night sweats or vaginal  dryness. Pt c/o pelvic cramping/pressure & pain in hips No pain with IC.  Pap smear Neg 11/2021. No history of significant abnormal Pap smears. Breasts normal, except for a bruise at the left upper inner breast.  Mammography Neg 09/2022.Colonoscopy 10/2017. BMI 25.28. Pt declined flu vaccine.   2. S/P total hysterectomy  3. Pelvic cramping U/A completely Negative.  Gyn exam s/p Total Hysterectomy wnl. - Urinalysis,Complete w/RFL Culture   Princess Bruins MD, 2:54 PM

## 2022-12-26 ENCOUNTER — Ambulatory Visit (INDEPENDENT_AMBULATORY_CARE_PROVIDER_SITE_OTHER): Payer: BC Managed Care – PPO | Admitting: Family Medicine

## 2022-12-26 ENCOUNTER — Encounter: Payer: Self-pay | Admitting: Family Medicine

## 2022-12-26 VITALS — BP 122/64 | HR 67 | Temp 97.6°F | Ht 63.25 in | Wt 149.4 lb

## 2022-12-26 DIAGNOSIS — M7502 Adhesive capsulitis of left shoulder: Secondary | ICD-10-CM | POA: Diagnosis not present

## 2022-12-26 NOTE — Progress Notes (Signed)
Anna [name] T. Anna Farabaugh, MD, [place] at [facility] [place], [address]  Phone: [phone]  FAX: [place] - 57 y.o. female  MRN [id number]  Date of Birth: 05/25/1966  Date: 12/26/2022  PCP: Abner Greenspan, MD  Referral: Abner Greenspan, MD  Chief Complaint  Patient presents with   Shoulder Pain    C/o L shoulder pain. Seen recently for sxs.    Subjective:   Anna [name] is a 57 y.o. very pleasant female patient with Body mass index is 26.25 kg/m. who presents with the following:  Pleasant patient presents with a acute shoulder injury and pain.  She presents with chronic left-sided shoulder pain, this has been present for greater than 6 months.  She has actually been seen by Dr. Danise Mina 2 days ago.   She presents today for further evaluation and management of her left-sided shoulder pain.  It looks that she was referred to physical therapy in May of last year for her shoulder, and it does not appear that she went.  Went to [place] for a month, over May, then started to work out, and it started to hurt.  She does have pain with terminal motion and a deep dull ache in the shoulder.  Seizure distribution of pain.  She has decreased range of motion throughout.  Review of Systems is noted in the HPI, as appropriate  Objective:   BP 122/64   Pulse 67   Temp 97.6 F (36.4 C) (Temporal)   Ht 5' 3.25" (1.607 m)   Wt 149 lb 6 oz (67.8 kg)   LMP 07/11/2013   SpO2 96%   BMI 26.25 kg/m   GEN: No acute distress; alert,appropriate. PULM: Breathing comfortably in no respiratory distress PSYCH: Normally interactive.    Shoulder: R and L Inspection: No muscle wasting or winging Ecchymosis/edema: neg  AC joint, scapula, clavicle: NT Cervical spine: NT, full ROM Spurling's: neg ABNORMAL SIDE TESTED: L UNLESS OTHERWISE NOTED, THE CONTRALATERAL SIDE HAS FULL RANGE OF  MOTION. Abduction: 5/5, LIMITED TO 125 DEGREES Flexion: 5/5, LIMITED TO 130 DEGNO ROM  IR, lift-off: 5/5. TESTED AT 90 DEGREES OF ABDUCTION, LIMITED TO 5 DEGREES ER at neutral:  5/5, TESTED AT 90 DEGREES OF ABDUCTION, LIMITED TO 50 DEGREES AC crossover and compression: PAIN Drop Test: neg Empty Can: neg Supraspinatus insertion: NT Bicipital groove: NT ALL OTHER SPECIAL TESTING EQUIVOCAL GIVEN LOSS OF MOTION C5-T1 intact Sensation intact Grip 5/5    Laboratory and Imaging Data:  Assessment and Plan:     ICD-10-CM   1. 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  This is the primary driver of her pain.  She may have had some initial impingement/cuff tendinopathy, but now she does have a classic frozen shoulder.  I gave her the [facility] range of motion protocol for frozen shoulder.  For now, she would like to proceed conservatively, avoid any intervention, and she will try to be diligent with her home rehab.  Social: Right now this is limiting her ability to exercise  Disposition: Return in about 2 months (around 02/24/2023) for follow-up with Dr. Lorelei Pont frozen shoulder.  Dr[employer] One speech-to-text software was used for transcription in this dictation.  Possible transcriptional errors can occur using Editor, commissioning.  Signed,  Maud Deed. Oluwadamilare Tobler, MD   Outpatient Encounter Medications as of 12/26/2022  Medication Sig   CALCIUM-MAGNESIUM-ZINC PO Take 2 capsules by mouth daily.   cetirizine (ZYRTEC) 10 MG tablet Take 10 mg by mouth as needed.    Cholecalciferol (VITAMIN D3 PO) Take 2,000 Units by mouth daily.    diphenhydrAMINE (BENADRYL) 50 MG capsule Take 25 mg by mouth as needed.    EPINEPHrine 0.3 mg/0.3 mL IJ SOAJ injection Inject 0.3 mg into the muscle as needed for anaphylaxis.   NASAL SALINE NA Place 1 spray into the nose daily as  needed.   vitamin E 180 MG (400 UNITS) capsule Take 400 Units by mouth daily.   No facility-administered encounter medications on file as of 12/26/2022.

## 2022-12-27 ENCOUNTER — Other Ambulatory Visit: Payer: BC Managed Care – PPO

## 2023-01-24 ENCOUNTER — Ambulatory Visit (INDEPENDENT_AMBULATORY_CARE_PROVIDER_SITE_OTHER): Payer: BC Managed Care – PPO | Admitting: Family Medicine

## 2023-01-24 ENCOUNTER — Encounter: Payer: Self-pay | Admitting: Family Medicine

## 2023-01-24 VITALS — BP 114/68 | HR 72 | Temp 97.5°F | Ht 63.25 in | Wt 143.0 lb

## 2023-01-24 DIAGNOSIS — R222 Localized swelling, mass and lump, trunk: Secondary | ICD-10-CM | POA: Diagnosis not present

## 2023-01-24 DIAGNOSIS — N644 Mastodynia: Secondary | ICD-10-CM | POA: Diagnosis not present

## 2023-01-24 NOTE — Progress Notes (Unsigned)
   Subjective:    Patient ID: Anna [name], female    DOB: [birth year], 57 y.o.   MRN: [id number]  HPI Pt presents for f/u of breast discomfort   Wt Readings from Last 3 Encounters:  01/24/23 143 lb (64.9 kg)  12/26/22 149 lb 6 oz (67.8 kg)  12/25/22 145 lb (65.8 kg)   25.13 kg/m  Vitals:   01/24/23 1444  BP: 114/68  Pulse: 72  Temp: (!) 97.5 F (36.4 C)  SpO2: 98%      She has had issues with intermittent breast discomfort in the past  Also sees gyn  Had a visit with Dr Darnell Level  Soft tissue swelling of chest wall - Primary       Mild swelling of upper chest wall just lateral to 2nd costochondral junction with overlying bruising without significant discomfort to palpation. Most consistent with soft tissue injury however pt denies inciting trauma/injury. Will treat supportively with warm compress/heating pad for next week and if not improving as expected discussed possible soft tissue ultrasound for further evaluation. Pt agrees with plan.          She had annual gyn visit on 12/25/22 Pap neg 11/2021 Noted a bruise and L upper /inner breast at time of that exam  Her exam was normal that day  Last mammogram report 09/2022 MM 3D SCREEN BREAST BILATERAL (Accession [id number]) (Order [id number]) Imaging Date: 09/27/2022 Department: [facility] Released By: Milus Height Authorizing: Hadley Soileau, Wynelle Fanny, MD   Exam Status  Status  Final [99]   PACS Intelerad Image Link   Show images for MM 3D SCREEN BREAST BILATERAL Study Result  Narrative & Impression  CLINICAL DATA:  Screening.   EXAM: DIGITAL SCREENING BILATERAL MAMMOGRAM WITH TOMOSYNTHESIS AND CAD   TECHNIQUE: Bilateral screening digital craniocaudal and mediolateral oblique mammograms were obtained. Bilateral screening digital breast tomosynthesis was performed. The images were evaluated with computer-aided detection.   COMPARISON:  Previous exam(s).   ACR Breast Density  Category c: The breast tissue is heterogeneously dense, which may obscure small masses.   FINDINGS: There are no findings suspicious for malignancy.   IMPRESSION: No mammographic evidence of malignancy. A result letter of this screening mammogram will be mailed directly to the patient.   RECOMMENDATION: Screening mammogram in one year. (Code:SM-B-01Y)   BI-RADS CATEGORY  1: Negative.      Now the knot is no longer there Still a little thickened area  The bruise is gone   Nothing in the other breast  Review of Systems     Objective:   Physical Exam        Assessment & Plan:

## 2023-01-24 NOTE — Patient Instructions (Signed)
Exam is improved and very re assuring   Watch the area  If the thickness/swelling that you feel is not gone in a month or if you still have tender areas let us know We will plan some imaging   Continue to avoid caffeine  Avoid too tight too lose sports bras as well

## 2023-01-26 NOTE — Assessment & Plan Note (Signed)
Re check of this area notes improvement No ecchymosis  Swelling is minimal with slight tenderness - very reassuring  Breast exam is normal  Rev last mammogram Inst pt to continue observing- if not entirely gone in 1 mon she wishes to have breast imaging

## 2023-01-26 NOTE — Assessment & Plan Note (Signed)
Area of concern is L upper chest wall and there is improvement on exam  Very reassuring Will continue to watch  Enc her to wear properly fitting sport bra (not tight or loose)

## 2023-01-27 ENCOUNTER — Other Ambulatory Visit (INDEPENDENT_AMBULATORY_CARE_PROVIDER_SITE_OTHER): Payer: BC Managed Care – PPO

## 2023-01-27 DIAGNOSIS — E7800 Pure hypercholesterolemia, unspecified: Secondary | ICD-10-CM

## 2023-01-27 LAB — LIPID PANEL
Cholesterol: 186 mg/dL (ref 0–200)
HDL: 54.2 mg/dL (ref >39.00)
LDL Cholesterol: 117 mg/dL — ABNORMAL HIGH (ref 0–99)
NonHDL: 131.92
Total CHOL/HDL Ratio: 3
Triglycerides: 75 mg/dL (ref 0.0–149.0)
VLDL: 15 mg/dL (ref 0.0–40.0)

## 2023-01-28 ENCOUNTER — Encounter: Payer: Self-pay | Admitting: *Deleted

## 2024-11-04 ENCOUNTER — Other Ambulatory Visit: Payer: Self-pay | Admitting: Family Medicine

## 2024-11-04 DIAGNOSIS — Z1231 Encounter for screening mammogram for malignant neoplasm of breast: Secondary | ICD-10-CM

## 2024-12-03 ENCOUNTER — Ambulatory Visit

## 2024-12-24 ENCOUNTER — Ambulatory Visit
Admission: RE | Admit: 2024-12-24 | Discharge: 2024-12-24 | Disposition: A | Discharge status: Home / Self Care | Source: Ambulatory Visit | Attending: Family Medicine | Admitting: Family Medicine

## 2024-12-24 DIAGNOSIS — Z1231 Encounter for screening mammogram for malignant neoplasm of breast: Secondary | ICD-10-CM
